# Patient Record
Sex: Male | Born: 1950 | Race: White | Hispanic: No | Marital: Married | Smoking: Former smoker
Health system: Southern US, Community
[De-identification: ages and names within clinical notes are randomized; demographics above are authoritative.]

## PROBLEM LIST (undated history)

## (undated) DIAGNOSIS — C801 Malignant (primary) neoplasm, unspecified: Secondary | ICD-10-CM

## (undated) DIAGNOSIS — I10 Essential (primary) hypertension: Secondary | ICD-10-CM

## (undated) DIAGNOSIS — J449 Chronic obstructive pulmonary disease, unspecified: Secondary | ICD-10-CM

## (undated) HISTORY — PX: BLADDER REMOVAL: SHX567

## (undated) HISTORY — PX: LOBECTOMY: SHX5089

## (undated) HISTORY — PX: OTHER SURGICAL HISTORY: SHX169

---

## 2009-05-22 ENCOUNTER — Ambulatory Visit: Payer: Self-pay | Admitting: Orthopedic Surgery

## 2009-07-20 ENCOUNTER — Inpatient Hospital Stay: Payer: Self-pay | Admitting: Internal Medicine

## 2009-07-20 IMAGING — CR DG ABDOMEN 3V
1 series · 4 of 4 positions shown · non-contrast
Comparison: none

REASON FOR EXAM: no bm in "about a week"
COMMENTS:

PROCEDURE:     DXR - DXR ABDOMEN 3-WAY (INCL PA CXR)  - [DATE]  [DATE]
RESULT:     Comparisons:  None

[Series 1: view not recorded · 0.17mm/px · 4 of 4 slices shown]
[im 1/4]
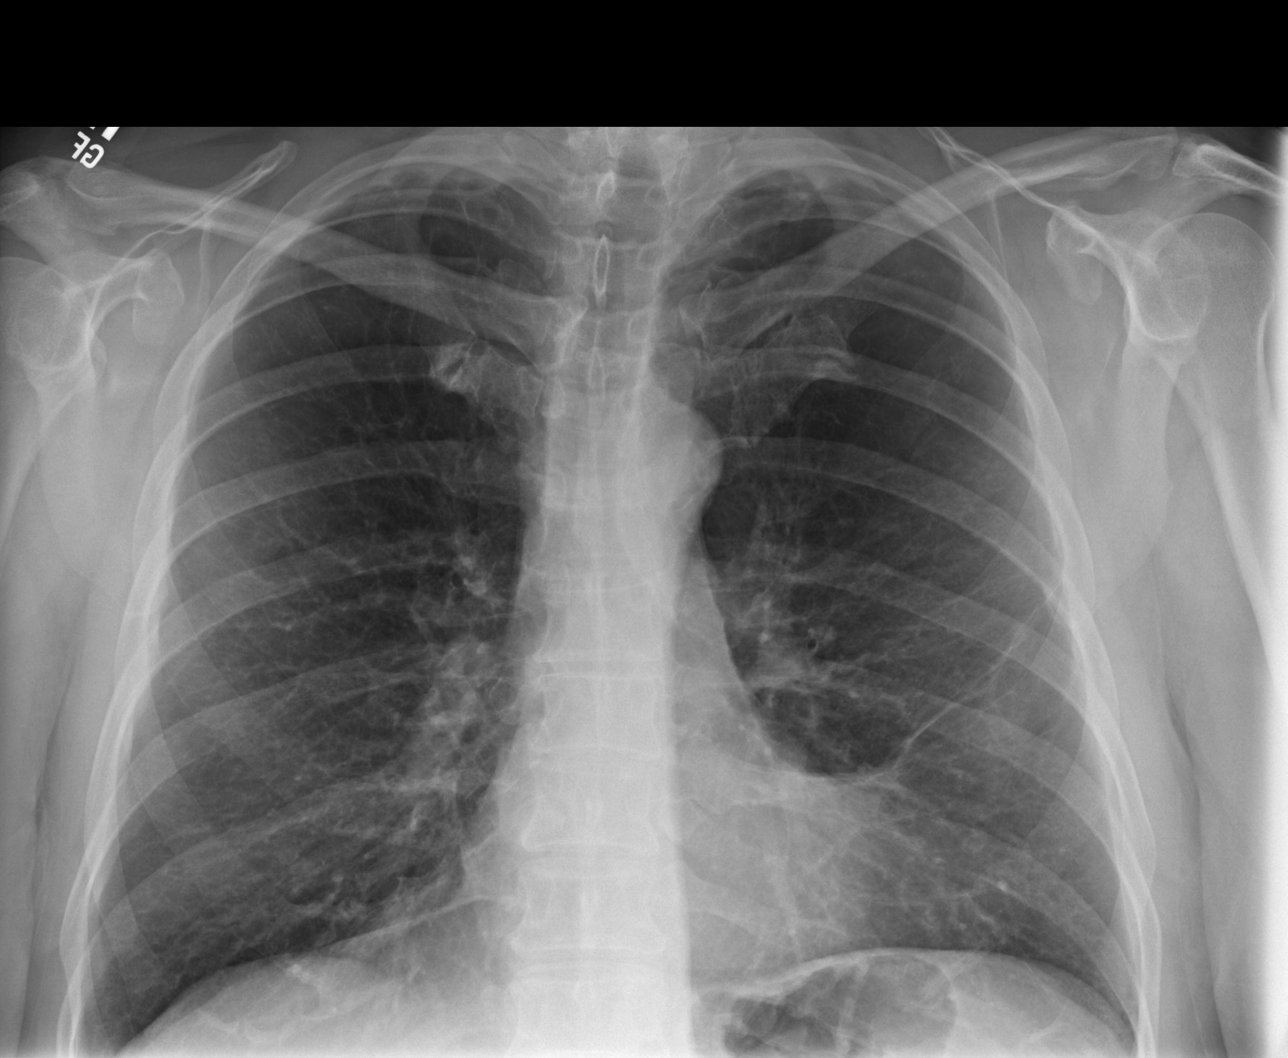
[im 2/4]
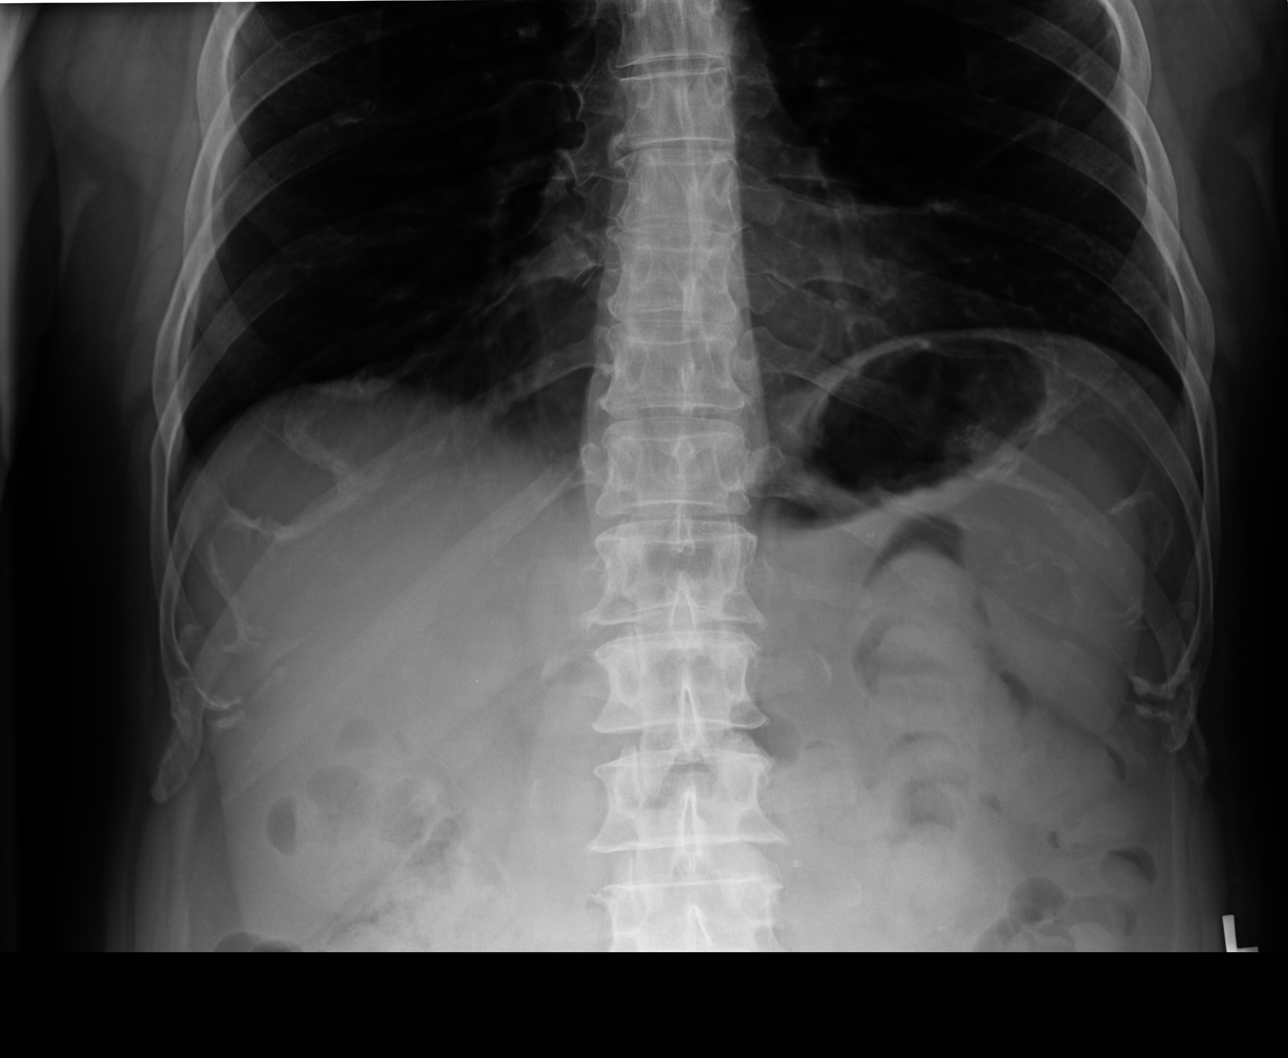
[im 3/4]
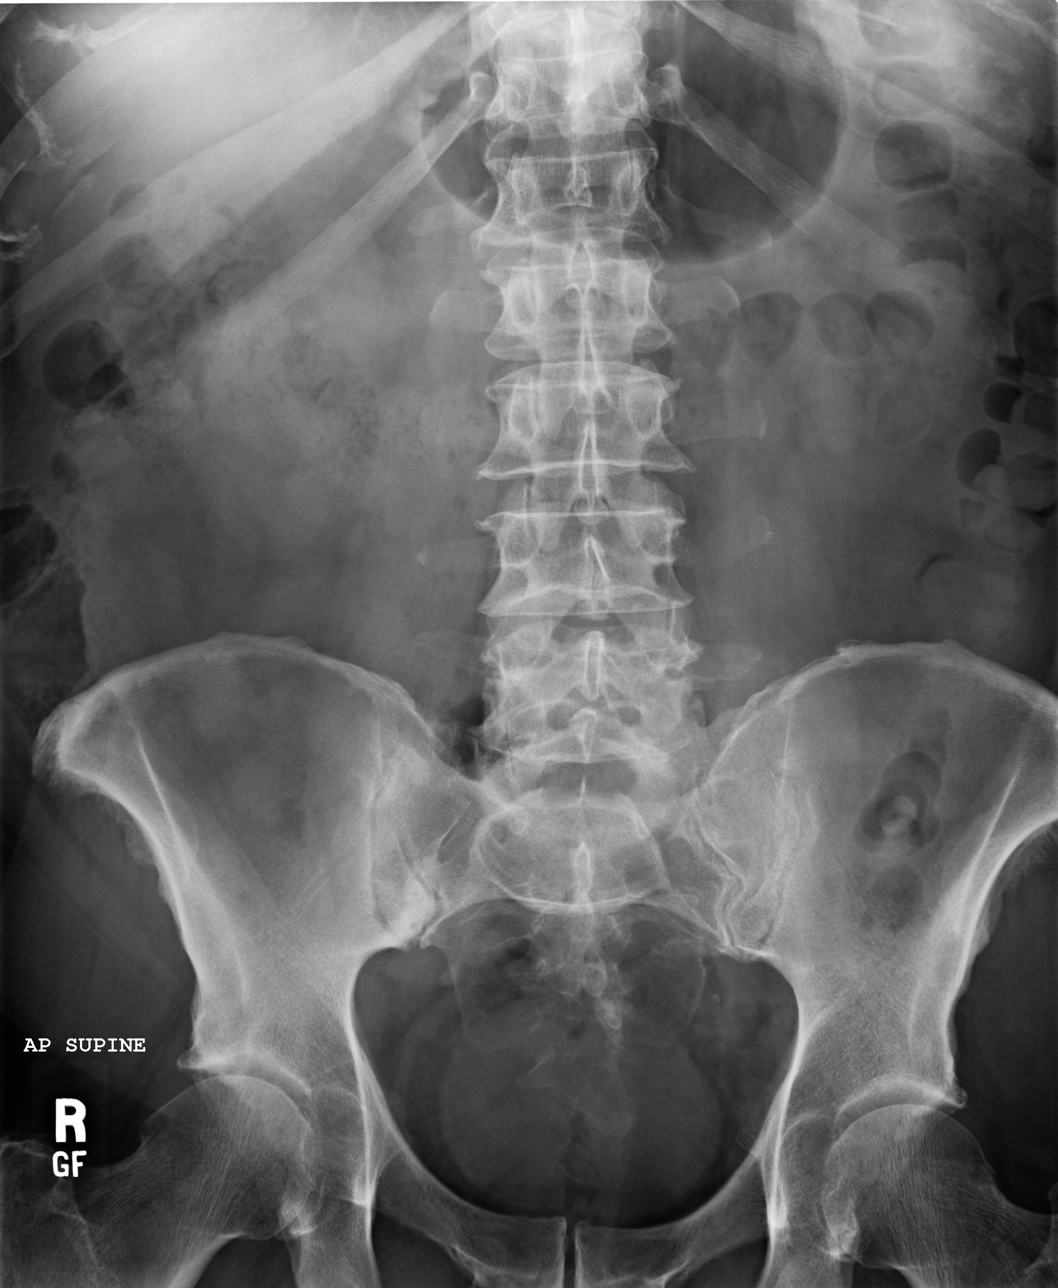
[im 4/4]
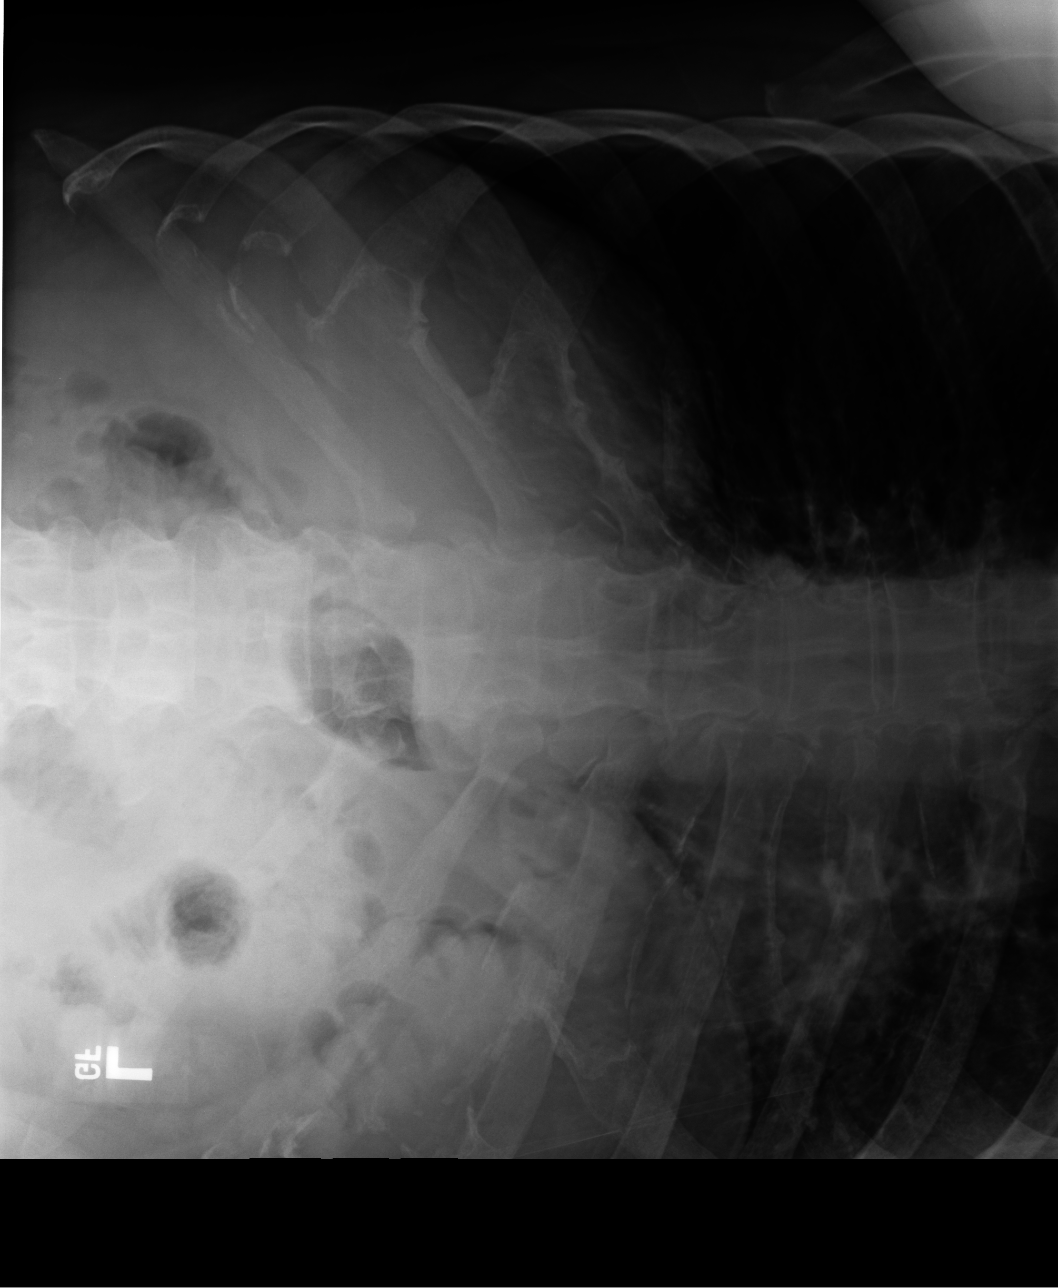

[4 of 4 positions shown; findings below may reference images not displayed]

FINDINGS: PA chest and supine, upright, and left lateral decubitus views of the
abdomen are provided.

There is lingular airspace disease likely representing atelectasis. The
heart and mediastinum are unremarkable. The osseous structures are
unremarkable.

There is a nonspecific bowel gas pattern. There is no bowel dilatation to
suggest obstruction. There are no air-fluid levels. There are no dilated
loops, bowel wall thickening, or evidence of mass effect.  Soft tissue
shadows are normal. There is no evidence of pneumoperitoneum, portal venous
gas, or pneumatosis.

Osseous structures are unremarkable.
IMPRESSION: Unremarkable abdominal series.

## 2009-07-21 IMAGING — US US RENAL KIDNEY
1 series · 17 of 25 positions shown · non-contrast
Comparison: none

REASON FOR EXAM: ARF
COMMENTS:

PROCEDURE:     US  - US KIDNEY  - [DATE]  [DATE]
RESULT:     Comparison: None
INDICATION: Acute renal failure

[Series 1: us renal kidney · 17 of 35 slices shown]
[im 1/35]
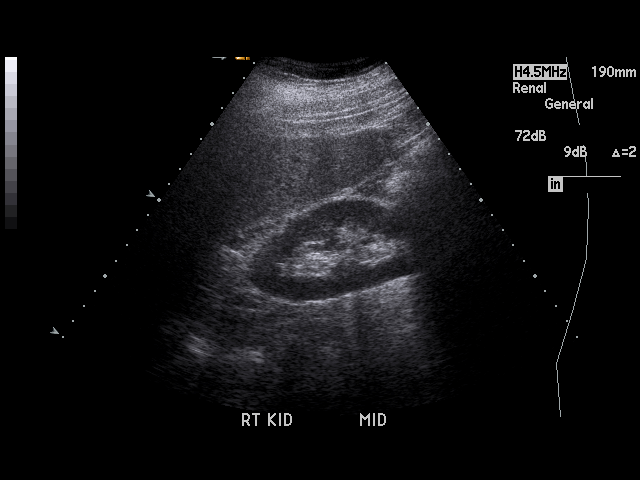
[im 3/35]
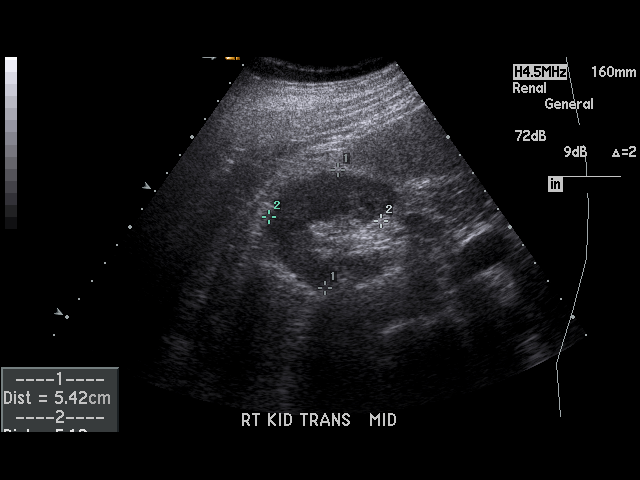
[im 5/35]
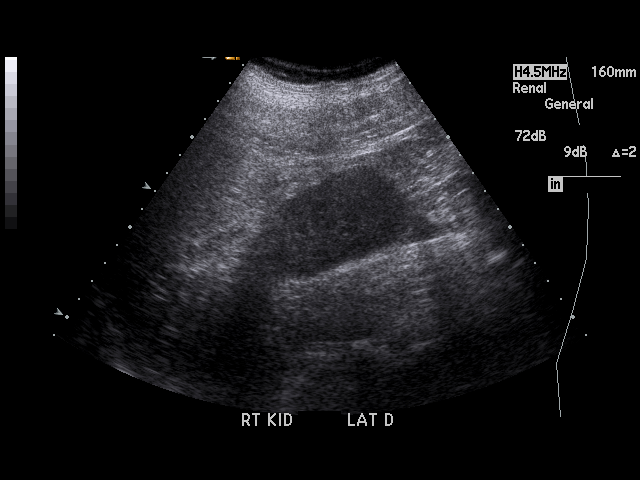
[im 8/35]
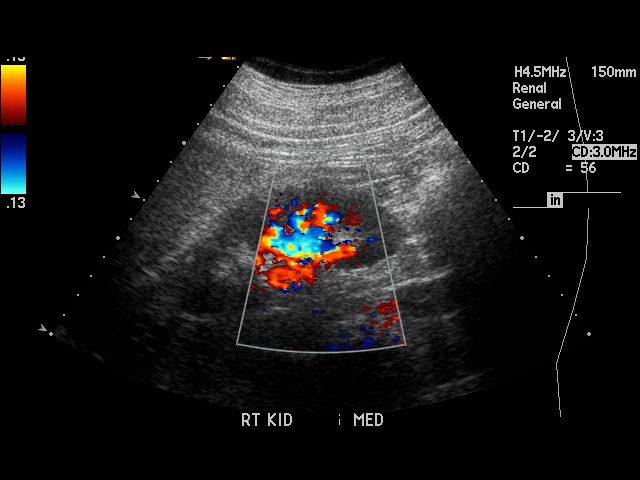
[im 9/35]
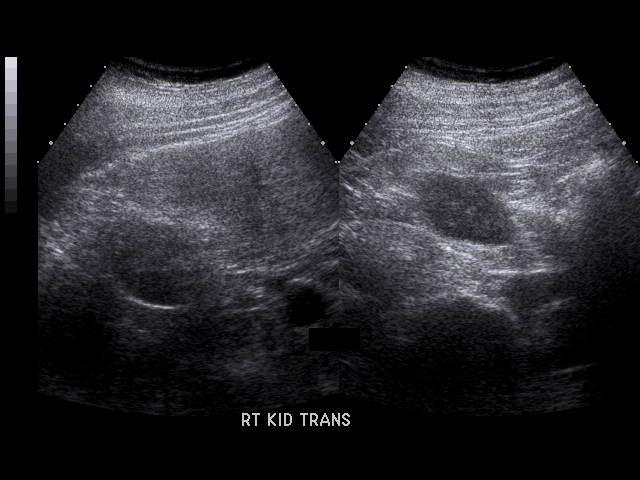
[im 12/35]
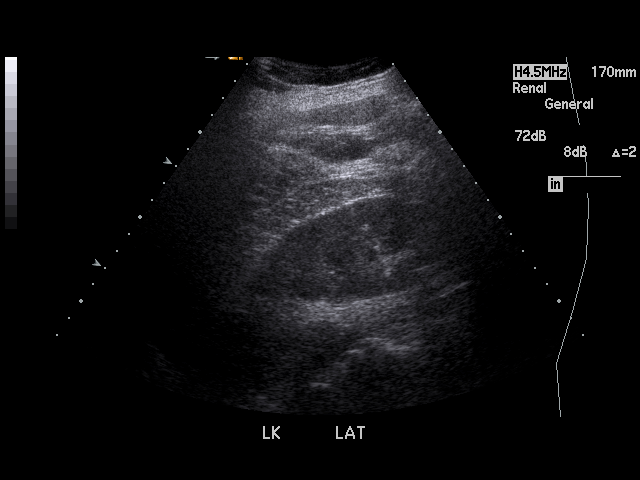
[im 13/35]
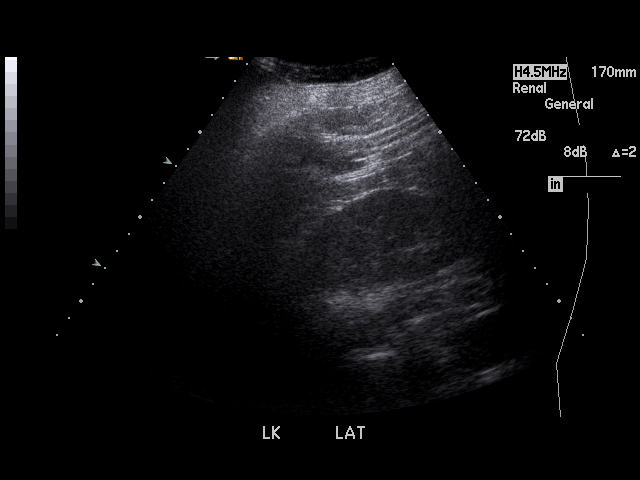
[im 16/35]
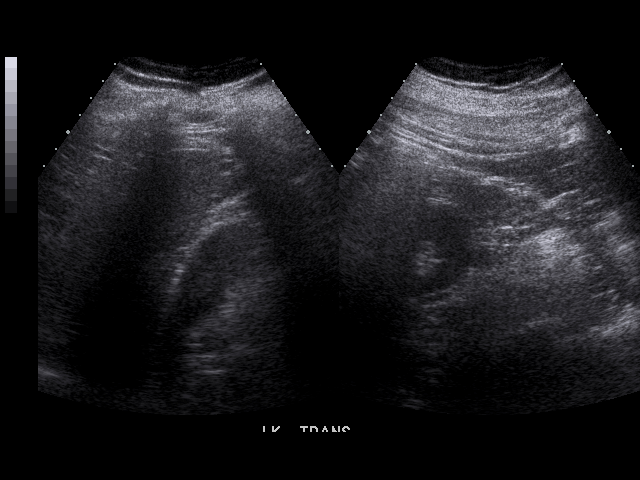
[im 18/35]
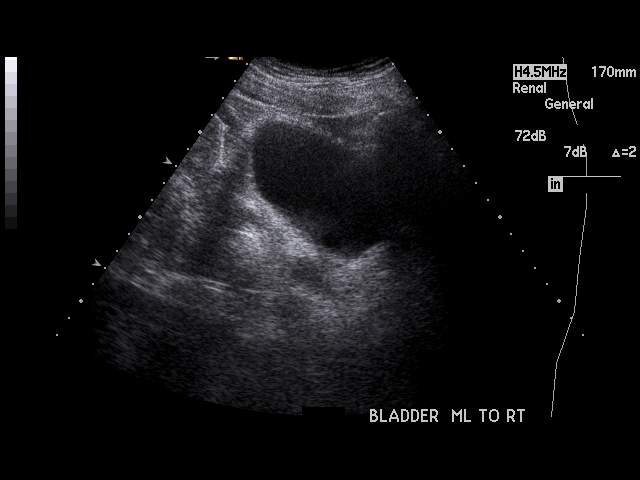
[im 19/35]
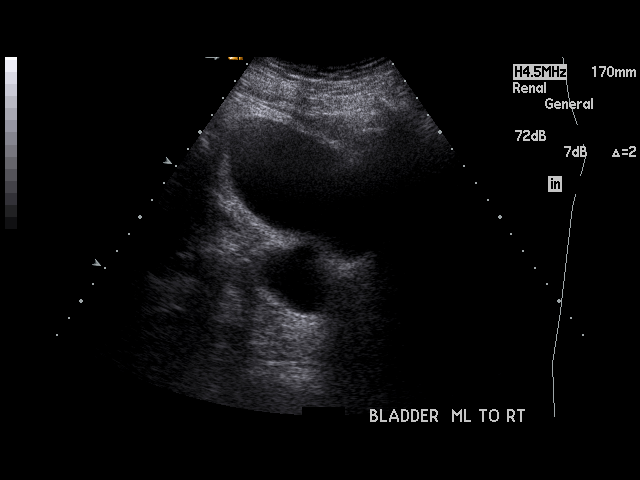
[im 22/35]
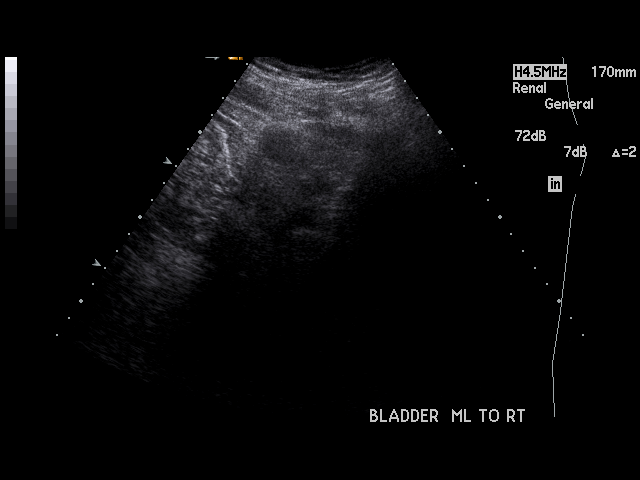
[im 23/35]
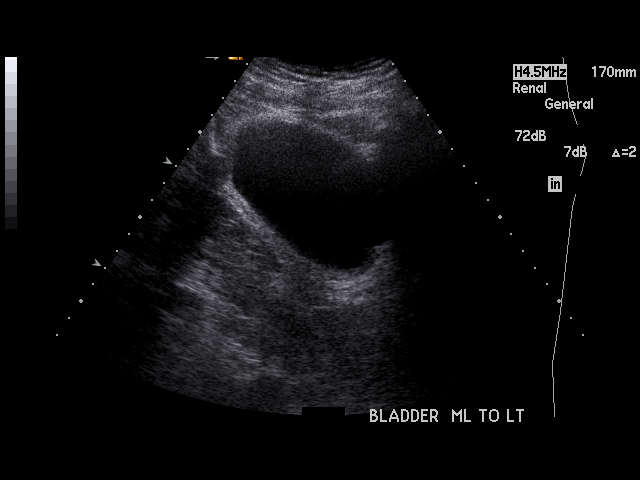
[im 26/35]
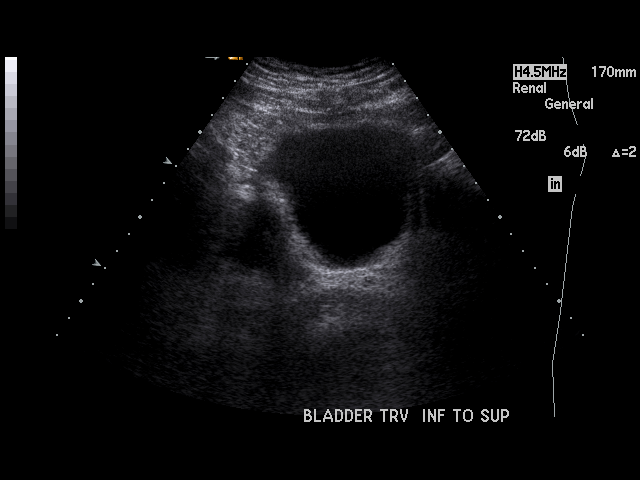
[im 27/35]
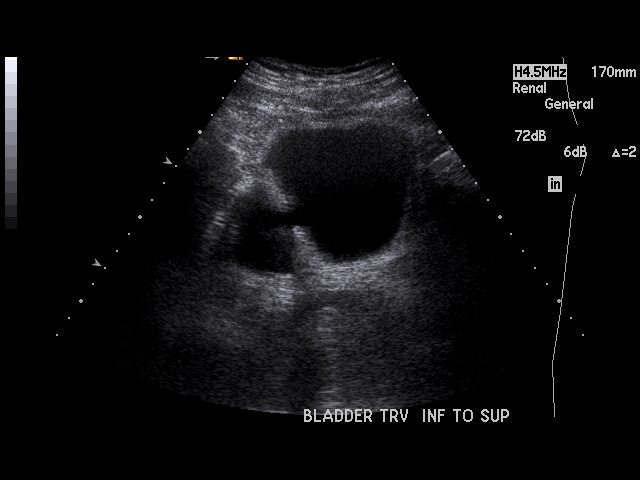
[im 30/35]
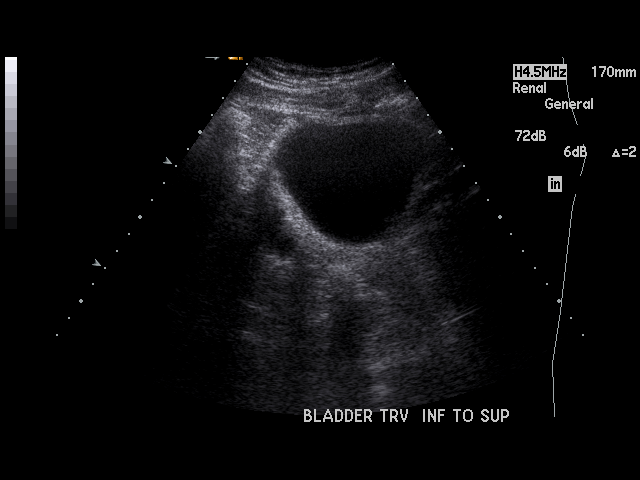
[im 32/35]
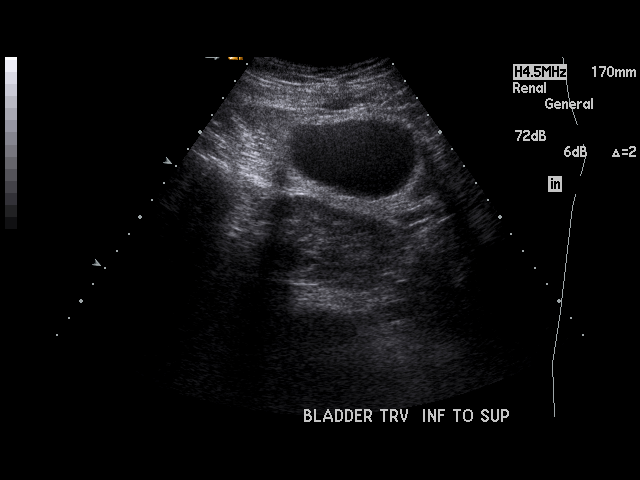
[im 35/35]
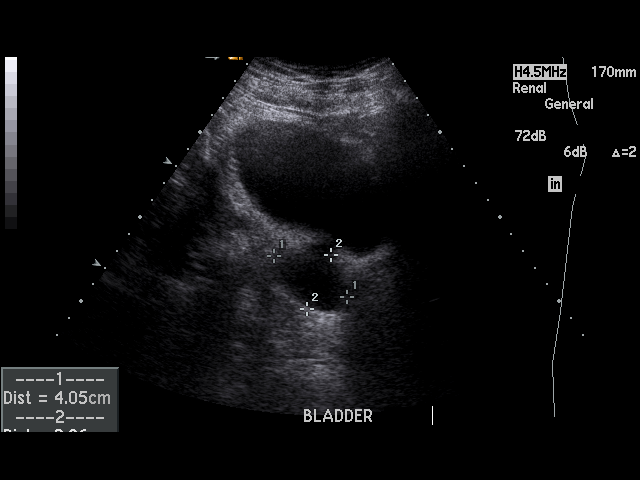

[17 of 25 positions shown; findings below may reference images not displayed]

Technique and findings: Multiple gray-scale and color doppler images of the
kidneys were obtained.

The right kidney measures 11.1 cm, the left kidney measures 11.3 cm. The
kidneys are normal in echogenicity. There is no hydronephrosis, echogenic
foci, or evident renal masses. No free fluid in the region of the renal
fossa. There is a 4.4 x 2.9 x 2.8 cm right bladder diverticulum.
IMPRESSION: 1. Normal renal ultrasound.

2. Right bladder diverticulum.

## 2009-08-14 ENCOUNTER — Ambulatory Visit: Payer: Self-pay | Admitting: Gastroenterology

## 2009-09-23 ENCOUNTER — Ambulatory Visit: Payer: Self-pay | Admitting: Gastroenterology

## 2015-02-14 ENCOUNTER — Emergency Department: Payer: Self-pay | Admitting: Emergency Medicine

## 2015-02-14 IMAGING — CR DG CHEST 2V
1 series · 2 of 2 positions shown · non-contrast
Comparison: None.

CLINICAL DATA: Initial evaluation for acute shortness of breath.

EXAM:
CHEST  2 VIEW

[Series 1: dxr chest pa (or ap) and lateral · 0.14mm/px · 2 of 2 slices shown]
[im 1/2]
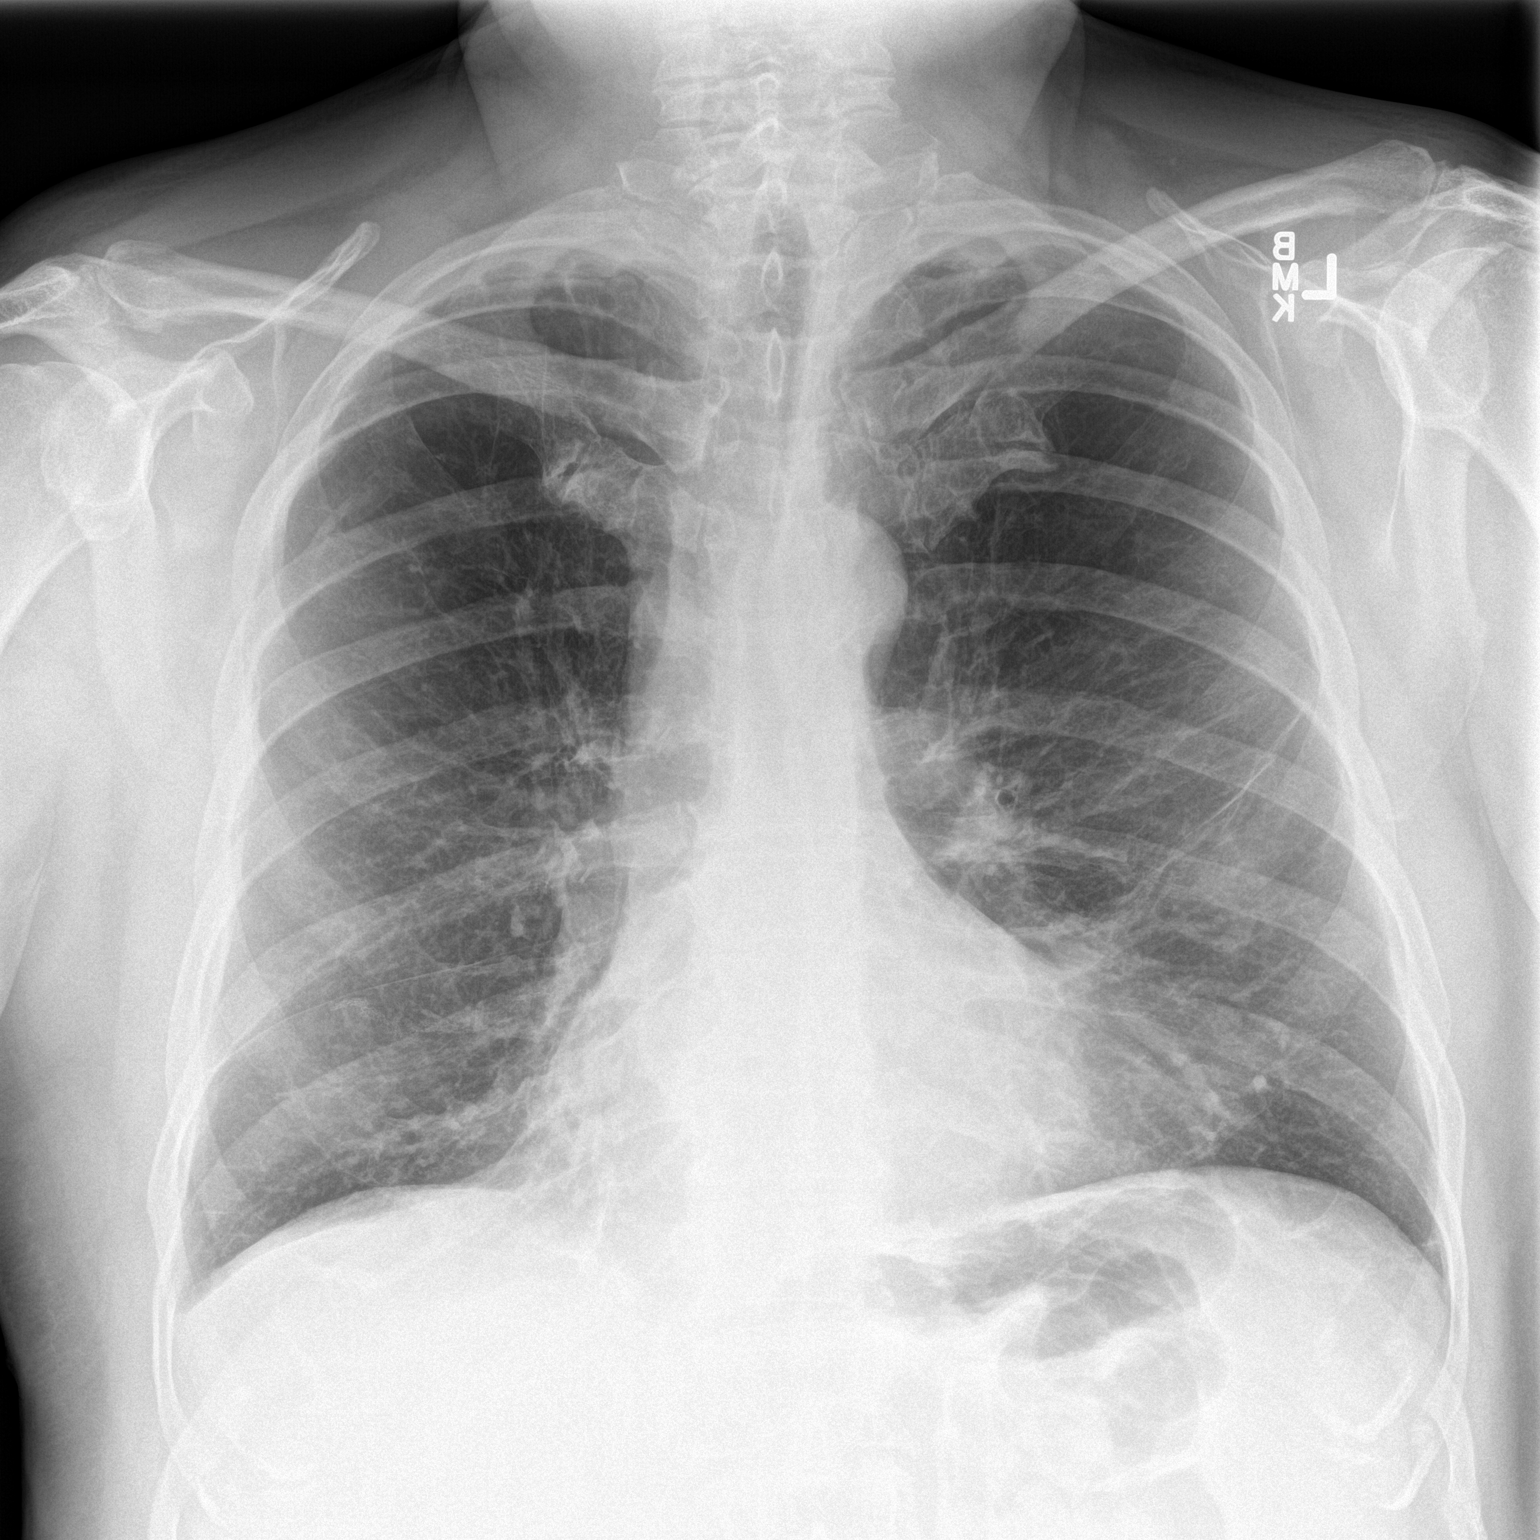
[im 2/2]
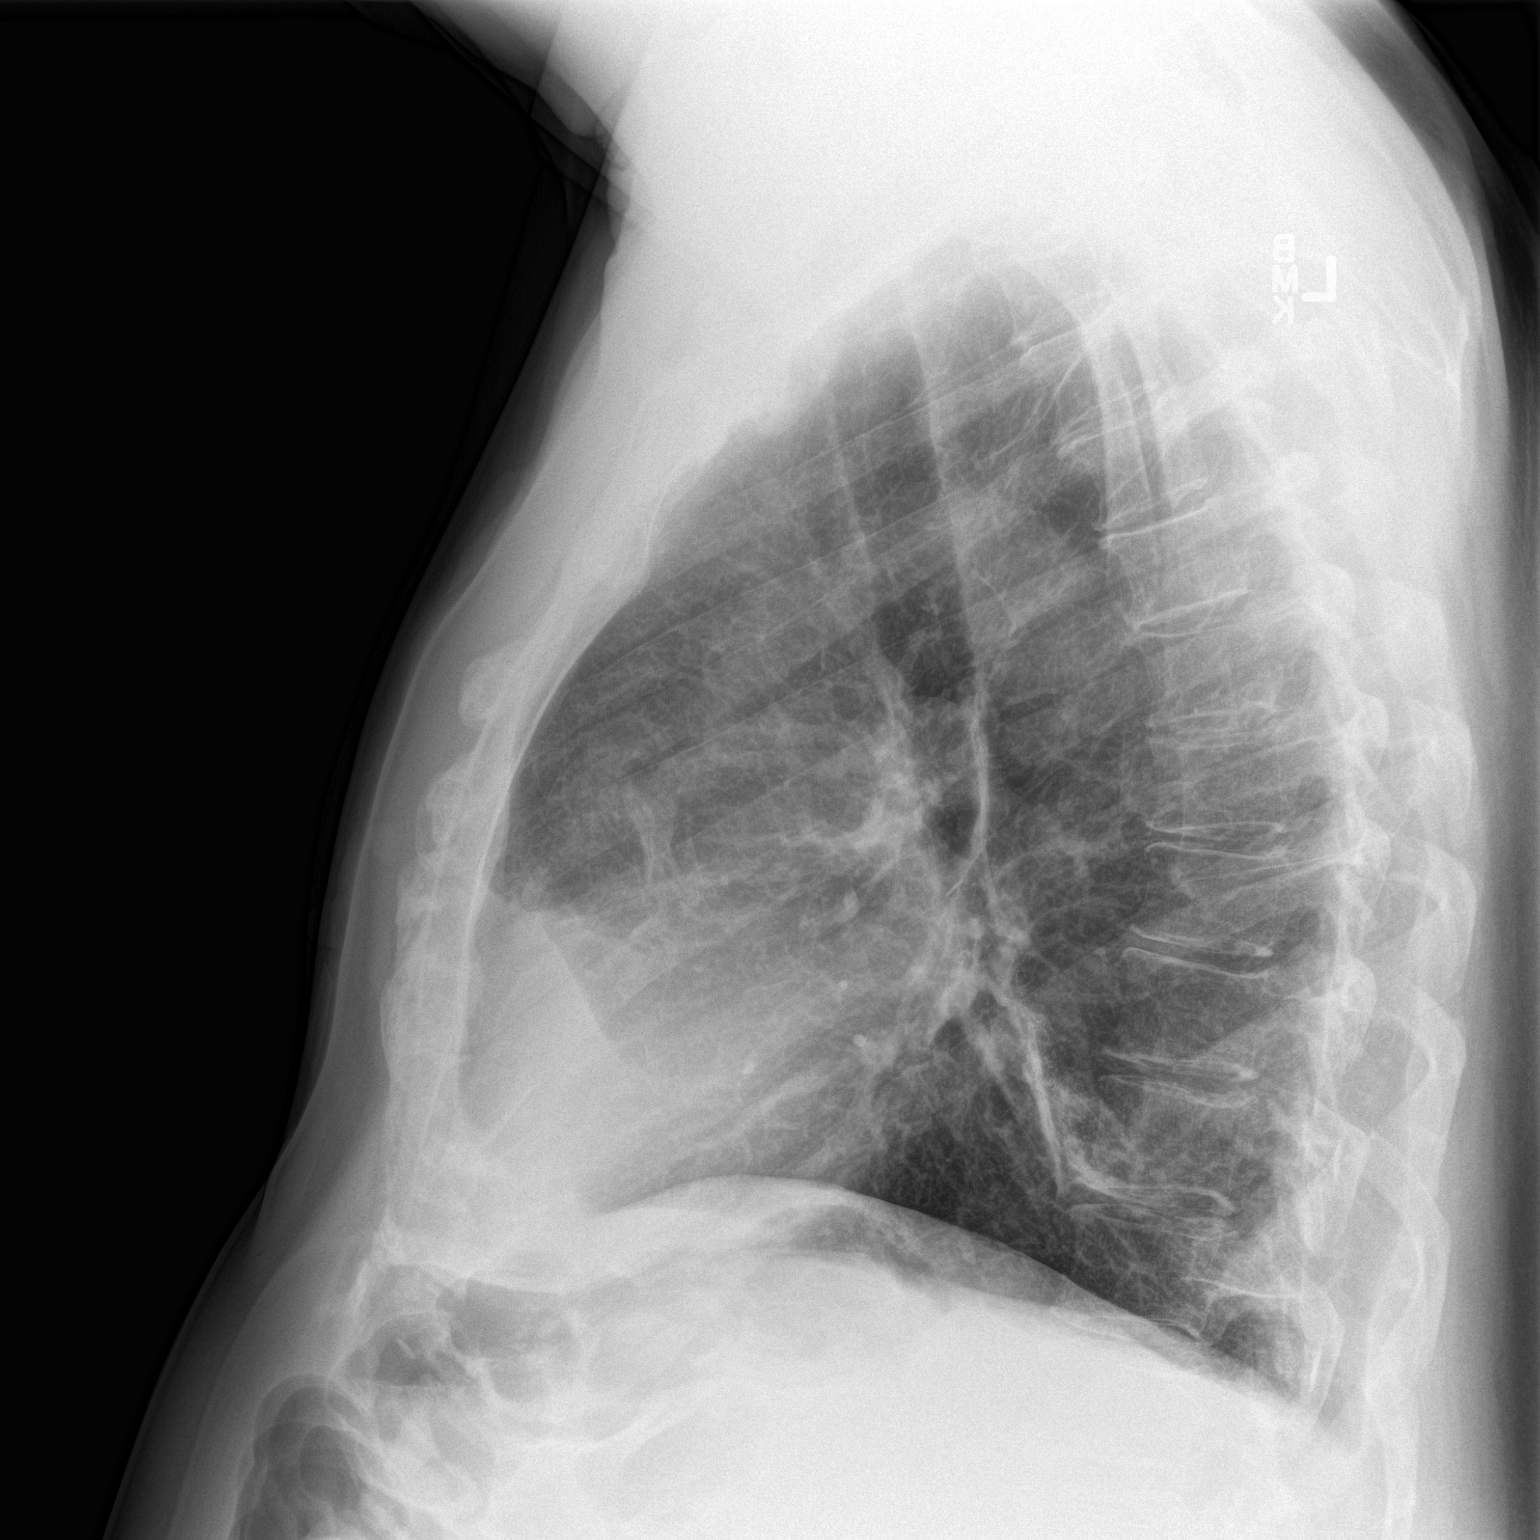

[2 of 2 positions shown; findings below may reference images not displayed]

FINDINGS: Cardiac and mediastinal silhouettes are within normal limits.

Lungs are normally inflated. Chronic emphysematous changes present.
There is streaky left perihilar scarring. No focal infiltrates. No
pulmonary edema or pleural effusion. No pneumothorax. Irregular
biapical pleural thickening noted.

Mild multilevel degenerative changes noted within the visualized
spine. No acute osseus abnormality.
IMPRESSION: COPD.  No active cardiopulmonary disease.

## 2017-07-20 ENCOUNTER — Emergency Department
Admission: EM | Admit: 2017-07-20 | Discharge: 2017-07-20 | Disposition: A | Discharge status: Home / Self Care | Payer: Non-veteran care | Attending: Emergency Medicine | Admitting: Emergency Medicine

## 2017-07-20 ENCOUNTER — Encounter: Payer: Self-pay | Admitting: Emergency Medicine

## 2017-07-20 DIAGNOSIS — T83031A Leakage of indwelling urethral catheter, initial encounter: Secondary | ICD-10-CM | POA: Diagnosis not present

## 2017-07-20 DIAGNOSIS — Y732 Prosthetic and other implants, materials and accessory gastroenterology and urology devices associated with adverse incidents: Secondary | ICD-10-CM | POA: Diagnosis not present

## 2017-07-20 DIAGNOSIS — J449 Chronic obstructive pulmonary disease, unspecified: Secondary | ICD-10-CM | POA: Diagnosis not present

## 2017-07-20 DIAGNOSIS — R6883 Chills (without fever): Secondary | ICD-10-CM | POA: Diagnosis not present

## 2017-07-20 DIAGNOSIS — I10 Essential (primary) hypertension: Secondary | ICD-10-CM | POA: Diagnosis not present

## 2017-07-20 DIAGNOSIS — T839XXA Unspecified complication of genitourinary prosthetic device, implant and graft, initial encounter: Secondary | ICD-10-CM

## 2017-07-20 HISTORY — DX: Essential (primary) hypertension: I10

## 2017-07-20 HISTORY — DX: Chronic obstructive pulmonary disease, unspecified: J44.9

## 2017-07-20 HISTORY — DX: Malignant (primary) neoplasm, unspecified: C80.1

## 2017-07-20 LAB — URINALYSIS, ROUTINE W REFLEX MICROSCOPIC
BACTERIA UA: NONE SEEN
BILIRUBIN URINE: NEGATIVE
Glucose, UA: NEGATIVE mg/dL
Ketones, ur: NEGATIVE mg/dL
NITRITE: NEGATIVE
PROTEIN: 100 mg/dL — AB
SQUAMOUS EPITHELIAL / LPF: NONE SEEN
Specific Gravity, Urine: 1.011 (ref 1.005–1.030)
pH: 6 (ref 5.0–8.0)

## 2017-07-20 LAB — CBC WITH DIFFERENTIAL/PLATELET
BASOS ABS: 0 10*3/uL (ref 0–0.1)
BASOS PCT: 0 %
Eosinophils Absolute: 0 10*3/uL (ref 0–0.7)
Eosinophils Relative: 0 %
HEMATOCRIT: 31.9 % — AB (ref 40.0–52.0)
Hemoglobin: 10.6 g/dL — ABNORMAL LOW (ref 13.0–18.0)
LYMPHS PCT: 6 %
Lymphs Abs: 0.4 10*3/uL — ABNORMAL LOW (ref 1.0–3.6)
MCH: 32.3 pg (ref 26.0–34.0)
MCHC: 33.4 g/dL (ref 32.0–36.0)
MCV: 96.9 fL (ref 80.0–100.0)
MONO ABS: 0.3 10*3/uL (ref 0.2–1.0)
MONOS PCT: 5 %
NEUTROS PCT: 89 %
Neutro Abs: 6.1 10*3/uL (ref 1.4–6.5)
PLATELETS: 221 10*3/uL (ref 150–440)
RBC: 3.29 MIL/uL — AB (ref 4.40–5.90)
RDW: 15.7 % — AB (ref 11.5–14.5)
WBC: 6.9 10*3/uL (ref 3.8–10.6)

## 2017-07-20 LAB — COMPREHENSIVE METABOLIC PANEL
ALBUMIN: 3.5 g/dL (ref 3.5–5.0)
ALK PHOS: 78 U/L (ref 38–126)
ALT: 10 U/L — ABNORMAL LOW (ref 17–63)
AST: 19 U/L (ref 15–41)
Anion gap: 10 (ref 5–15)
BUN: 19 mg/dL (ref 6–20)
CHLORIDE: 104 mmol/L (ref 101–111)
CO2: 23 mmol/L (ref 22–32)
Calcium: 8.8 mg/dL — ABNORMAL LOW (ref 8.9–10.3)
Creatinine, Ser: 1.74 mg/dL — ABNORMAL HIGH (ref 0.61–1.24)
GFR calc non Af Amer: 39 mL/min — ABNORMAL LOW (ref >60)
GFR, EST AFRICAN AMERICAN: 45 mL/min — AB (ref >60)
GLUCOSE: 154 mg/dL — AB (ref 65–99)
POTASSIUM: 4.2 mmol/L (ref 3.5–5.1)
SODIUM: 137 mmol/L (ref 135–145)
Total Bilirubin: 1.2 mg/dL (ref 0.3–1.2)
Total Protein: 7.6 g/dL (ref 6.5–8.1)

## 2017-07-20 LAB — LACTIC ACID, PLASMA: LACTIC ACID, VENOUS: 1.6 mmol/L (ref 0.5–1.9)

## 2017-07-20 NOTE — ED Triage Notes (Signed)
Pt arrived to ED via EMS from home with reports that pt had a bladder biopsy on Monday, Foley Catheter placed, pt called EMS tonight due to urinating around catheter. Catheter in place on assessment and draining.

## 2017-07-20 NOTE — Discharge Instructions (Signed)
Please keep your follow-up appointment with your urologist in 2 days as scheduled and return to the emergency department for any new or worsening symptoms such as fevers, chills, pain, if your Foley catheter becomes block, or for any other concerns whatsoever.  It was a pleasure to take care of you today, and thank you for coming to our emergency department.  If you have any questions or concerns before leaving please ask the nurse to grab me and I'm more than happy to go through your aftercare instructions again.  If you were prescribed any opioid pain medication today such as Norco, Vicodin, Percocet, morphine, hydrocodone, or oxycodone please make sure you do not drive when you are taking this medication as it can alter your ability to drive safely.  If you have any concerns once you are home that you are not improving or are in fact getting worse before you can make it to your follow-up appointment, please do not hesitate to call [phone] and come back for further evaluation.  Darel Hong, MD  Results for orders placed or performed during the hospital encounter of 07/20/17  Lactic acid, plasma  Result Value Ref Range   Lactic Acid, Venous 1.6 0.5 - 1.9 mmol/L  Comprehensive metabolic panel  Result Value Ref Range   Sodium 137 135 - 145 mmol/L   Potassium 4.2 3.5 - 5.1 mmol/L   Chloride 104 101 - 111 mmol/L   CO2 23 22 - 32 mmol/L   Glucose, Bld 154 (H) 65 - 99 mg/dL   BUN 19 6 - 20 mg/dL   Creatinine, Ser 1.74 (H) 0.61 - 1.24 mg/dL   Calcium 8.8 (L) 8.9 - 10.3 mg/dL   Total Protein 7.6 6.5 - 8.1 g/dL   Albumin 3.5 3.5 - 5.0 g/dL   AST 19 15 - 41 U/L   ALT 10 (L) 17 - 63 U/L   Alkaline Phosphatase 78 38 - 126 U/L   Total Bilirubin 1.2 0.3 - 1.2 mg/dL   GFR calc non Af Amer 39 (L) >60 mL/min   GFR calc Af Amer 45 (L) >60 mL/min   Anion gap 10 5 - 15  CBC WITH DIFFERENTIAL  Result Value Ref Range   WBC 6.9 3.8 - 10.6 K/uL   RBC 3.29 (L) 4.40 - 5.90 MIL/uL   Hemoglobin 10.6 (L)  13.0 - 18.0 g/dL   HCT 31.9 (L) 40.0 - 52.0 %   MCV 96.9 80.0 - 100.0 fL   MCH 32.3 26.0 - 34.0 pg   MCHC 33.4 32.0 - 36.0 g/dL   RDW 15.7 (H) 11.5 - 14.5 %   Platelets 221 150 - 440 K/uL   Neutrophils Relative % 89 %   Neutro Abs 6.1 1.4 - 6.5 K/uL   Lymphocytes Relative 6 %   Lymphs Abs 0.4 (L) 1.0 - 3.6 K/uL   Monocytes Relative 5 %   Monocytes Absolute 0.3 0.2 - 1.0 K/uL   Eosinophils Relative 0 %   Eosinophils Absolute 0.0 0 - 0.7 K/uL   Basophils Relative 0 %   Basophils Absolute 0.0 0 - 0.1 K/uL  Urinalysis, Routine w reflex microscopic  Result Value Ref Range   Color, Urine RED (A) YELLOW   APPearance HAZY (A) CLEAR   Specific Gravity, Urine 1.011 1.005 - 1.030   pH 6.0 5.0 - 8.0   Glucose, UA NEGATIVE NEGATIVE mg/dL   Hgb urine dipstick LARGE (A) NEGATIVE   Bilirubin Urine NEGATIVE NEGATIVE   Ketones, ur NEGATIVE NEGATIVE  mg/dL   Protein, ur 100 (A) NEGATIVE mg/dL   Nitrite NEGATIVE NEGATIVE   Leukocytes, UA MODERATE (A) NEGATIVE   RBC / HPF TOO NUMEROUS TO COUNT 0 - 5 RBC/hpf   WBC, UA TOO NUMEROUS TO COUNT 0 - 5 WBC/hpf   Bacteria, UA NONE SEEN NONE SEEN   Squamous Epithelial / LPF NONE SEEN NONE SEEN   Mucous PRESENT

## 2017-07-20 NOTE — ED Provider Notes (Signed)
[facility] Emergency Department Provider Note  ____________________________________________   First MD Initiated Contact with Patient 07/20/17 [phone]     (approximate)  I have reviewed the triage vital signs and the nursing notes.   HISTORY  Chief Complaint Post-op Problem    HPI Samuel Moore is a 66 y.o. male is brought to the emergency department via EMS after noting that his Foley catheter was leaking. He was urinating from his penis around the Foley catheter. He also had a brief episode of chills. Earlier today he was at the [facility] and had surgery for bladder cancer. He is currently in no pain. His surgeon told him that if he developed any complications to go immediately to the nearest emergency department. The chills were a brief episode and has not recurred.   Past Medical History:  Diagnosis Date  . Cancer ([place])   . COPD (chronic obstructive pulmonary disease) ([place])   . Hypertension     There are no active problems to display for this patient.   History reviewed. No pertinent surgical history.  Prior to Admission medications   Not on File    Allergies Patient has no known allergies.  History reviewed. No pertinent family history.  Social History Social History  Substance Use Topics  . Smoking status: Never Smoker  . Smokeless tobacco: Never Used  . Alcohol use No    Review of Systems Constitutional: Positive chills Eyes: No visual changes. ENT: No sore throat. Cardiovascular: Denies chest pain. Respiratory: Denies shortness of breath. Gastrointestinal: No abdominal pain.  No nausea, no vomiting.  No diarrhea.  No constipation. Genitourinary: Negative for dysuria. Musculoskeletal: Negative for back pain. Skin: Negative for rash. Neurological: Negative for headaches, focal weakness or numbness.   ____________________________________________   PHYSICAL EXAM:  VITAL SIGNS: ED Triage Vitals [07/20/17 0051]  Enc  Vitals Group     BP (!) 150/100     Pulse Rate 95     Resp 16     Temp 98 F (36.7 C)     Temp Source Oral     SpO2 98 %     Weight 222 lb (100.7 kg)     Height 6' (1.829 m)     Head Circumference      Peak Flow      Pain Score      Pain Loc      Pain Edu?      Excl. in [place]?     Constitutional: Alert and oriented 4 pleasant cooperative speaks in full clear sentences no diaphoresis Eyes: PERRL EOMI. Head: Atraumatic. Nose: No congestion/rhinnorhea. Mouth/Throat: No trismus Neck: No stridor.   Cardiovascular: Normal rate, regular rhythm. Grossly normal heart sounds.  Good peripheral circulation. Respiratory: Normal respiratory effort.  No retractions. Lungs CTAB and moving good air Gastrointestinal: Soft nondistended nontender no rebound or guarding no peritonitis fully catheter in place with blood-tinged urine no costovertebral tenderness Musculoskeletal: No lower extremity edema   Neurologic:  Normal speech and language. No gross focal neurologic deficits are appreciated. Skin:  Skin is warm, dry and intact. No rash noted. Psychiatric: Mood and affect are normal. Speech and behavior are normal.    ____________________________________________   DIFFERENTIAL includes but not limited to  Sepsis, UTI, pyelonephritis, bacteremia ____________________________________________   LABS (all labs ordered are listed, but only abnormal results are displayed)  Labs Reviewed  COMPREHENSIVE METABOLIC PANEL - Abnormal; Notable for the following:       Result Value  Glucose, Bld 154 (*)    Creatinine, Ser 1.74 (*)    Calcium 8.8 (*)    ALT 10 (*)    GFR calc non Af Amer 39 (*)    GFR calc Af Amer 45 (*)    All other components within normal limits  CBC WITH DIFFERENTIAL/PLATELET - Abnormal; Notable for the following:    RBC 3.29 (*)    Hemoglobin 10.6 (*)    HCT 31.9 (*)    RDW 15.7 (*)    Lymphs Abs 0.4 (*)    All other components within normal limits  URINALYSIS, ROUTINE  W REFLEX MICROSCOPIC - Abnormal; Notable for the following:    Color, Urine RED (*)    APPearance HAZY (*)    Hgb urine dipstick LARGE (*)    Protein, ur 100 (*)    Leukocytes, UA MODERATE (*)    All other components within normal limits  CULTURE, BLOOD (ROUTINE X 2)  CULTURE, BLOOD (ROUTINE X 2)  URINE CULTURE  LACTIC ACID, PLASMA    Normal white count, no nitrites, normal lactate, no signs of infection __________________________________________  EKG   ____________________________________________  RADIOLOGY   ____________________________________________   PROCEDURES  Procedure(s) performed: no  Procedures  Critical Care performed: no  Observation: no ____________________________________________   INITIAL IMPRESSION / ASSESSMENT AND PLAN / ED COURSE  Pertinent labs & imaging results that were available during my care of the patient were reviewed by me and considered in my medical decision making (see chart for details).  The patient arrives very well-appearing with a benign abdominal exam and no costovertebral tenderness. Regarding the leaking around his Foley catheter I explained to the patient that this is quite normal. He is afebrile here. His brief episode of chills however is concerning for an infectious etiology. Fortunately his blood work is reassuring. Cultures have been sent and we will follow-up. At this point is medically stable for outpatient management with follow-up in 2 days as scheduled. Strict return precautions given.      ____________________________________________   FINAL CLINICAL IMPRESSION(S) / ED DIAGNOSES  Final diagnoses:  Chills  Problem with Foley catheter, initial encounter ([place])      NEW MEDICATIONS STARTED DURING THIS VISIT:  There are no discharge medications for this patient.    Note:  This document was prepared using Dragon voice recognition software and may include unintentional dictation errors.     Darel Hong, MD 07/20/17 [phone]

## 2017-07-21 LAB — URINE CULTURE: CULTURE: NO GROWTH

## 2017-07-25 LAB — CULTURE, BLOOD (ROUTINE X 2)
CULTURE: NO GROWTH
Culture: NO GROWTH
SPECIAL REQUESTS: ADEQUATE
SPECIAL REQUESTS: ADEQUATE

## 2017-12-21 ENCOUNTER — Other Ambulatory Visit: Payer: Self-pay

## 2017-12-21 ENCOUNTER — Encounter: Payer: Non-veteran care | Attending: Internal Medicine

## 2017-12-21 VITALS — Ht 73.2 in | Wt 180.8 lb

## 2017-12-21 DIAGNOSIS — J449 Chronic obstructive pulmonary disease, unspecified: Secondary | ICD-10-CM | POA: Insufficient documentation

## 2017-12-21 DIAGNOSIS — Z8701 Personal history of pneumonia (recurrent): Secondary | ICD-10-CM | POA: Diagnosis not present

## 2017-12-21 DIAGNOSIS — C679 Malignant neoplasm of bladder, unspecified: Secondary | ICD-10-CM | POA: Diagnosis not present

## 2017-12-21 NOTE — Progress Notes (Signed)
Pulmonary Individual Treatment Plan  Patient Details  Name: Samuel Moore MRN: [id number] Date of Birth: [birth year] Referring Provider:     Pulmonary Rehab from 12/21/2017 in [facility] Cardiac and Pulmonary Rehab  Referring Provider  Califano, Lucy Chris MD [[facility]]      Initial Encounter Date:    Pulmonary Rehab from 12/21/2017 in [facility] Cardiac and Pulmonary Rehab  Date  12/21/17  Referring Provider  Elenore Rota MD [[place]]      Visit Diagnosis: Chronic obstructive pulmonary disease, unspecified COPD type ([place])  Patient's Home Medications on Admission: No current outpatient medications on file.  Past Medical History: Past Medical History:  Diagnosis Date  . Cancer ([place])   . COPD (chronic obstructive pulmonary disease) ([place])   . Hypertension     Tobacco Use: Social History   Tobacco Use  Smoking Status Former Smoker  . Packs/day: 2.00  . Years: 10.00  . Pack years: 20.00  . Types: Cigarettes  . Last attempt to quit: 12/14/2002  . Years since quitting: 15.0  Smokeless Tobacco Never Used    Labs: Recent Review Flowsheet Data    There is no flowsheet data to display.       Pulmonary Assessment Scores: Pulmonary Assessment Scores    Row Name 12/21/17 1442         ADL UCSD   ADL Phase  Entry     SOB Score total  48     Rest  1     Walk  2     Stairs  4     Bath  1     Dress  1     Shop  3       CAT Score   CAT Score  27        Pulmonary Function Assessment: Pulmonary Function Assessment - 12/21/17 1509      Initial Spirometry Results   FVC%  81 %    FEV1%  80 %    FEV1/FVC Ratio  73.13    Comments  Best of 2 attempts, good patient effort      Post Bronchodilator Spirometry Results   FVC%  81 %    FEV1%  80 %    FEV1/FVC Ratio  67.51    Comments  Best of 2 attempts, good patient effort      Breath   Bilateral Breath Sounds  Clear    Shortness of Breath  Fear of Shortness of Breath;Limiting activity;Yes       Exercise Target  Goals: Date: 12/21/17  Exercise Program Goal: Individual exercise prescription set with THRR, safety & activity barriers. Participant demonstrates ability to understand and report RPE using BORG scale, to self-measure pulse accurately, and to acknowledge the importance of the exercise prescription.  Exercise Prescription Goal: Starting with aerobic activity 30 plus minutes a day, 3 days per week for initial exercise prescription. Provide home exercise prescription and guidelines that participant acknowledges understanding prior to discharge.  Activity Barriers & Risk Stratification: Activity Barriers & Cardiac Risk Stratification - 12/21/17 1555      Activity Barriers & Cardiac Risk Stratification   Activity Barriers  Deconditioning;Muscular Weakness;Shortness of Breath       6 Minute Walk: 6 Minute Walk    Row Name 12/21/17 1552         6 Minute Walk   Phase  Initial     Distance  1333 feet     Walk Time  6 minutes     #  of Rest Breaks  0     MPH  2.52     METS  3.9     RPE  11     Perceived Dyspnea   2     VO2 Peak  13.67     Symptoms  No     Resting HR  90 bpm     Resting BP  132/74     Resting Oxygen Saturation   100 %     Exercise Oxygen Saturation  during 6 min walk  99 %     Max Ex. HR  138 bpm     Max Ex. BP  142/72     2 Minute Post BP  136/76       Interval HR   1 Minute HR  126     2 Minute HR  129     3 Minute HR  132     4 Minute HR  132     5 Minute HR  133     6 Minute HR  138     2 Minute Post HR  105     Interval Heart Rate?  Yes       Interval Oxygen   Interval Oxygen?  Yes     Baseline Oxygen Saturation %  100 %     1 Minute Oxygen Saturation %  100 %     1 Minute Liters of Oxygen  0 L Room Air     2 Minute Oxygen Saturation %  100 %     2 Minute Liters of Oxygen  0 L     3 Minute Oxygen Saturation %  99 %     3 Minute Liters of Oxygen  0 L     4 Minute Oxygen Saturation %  99 %     4 Minute Liters of Oxygen  0 L     5 Minute Oxygen  Saturation %  99 %     5 Minute Liters of Oxygen  0 L     6 Minute Oxygen Saturation %  99 %     6 Minute Liters of Oxygen  0 L     2 Minute Post Oxygen Saturation %  100 %     2 Minute Post Liters of Oxygen  0 L       Oxygen Initial Assessment: Oxygen Initial Assessment - 12/21/17 1448      Home Oxygen   Home Oxygen Device  None    Sleep Oxygen Prescription  CPAP    Liters per minute  0    Home Exercise Oxygen Prescription  None    Home at Rest Exercise Oxygen Prescription  None    Compliance with Home Oxygen Use  No    Comments  -- he has not been wearing      Initial 6 min Walk   Oxygen Used  None      Program Oxygen Prescription   Program Oxygen Prescription  None      Intervention   Short Term Goals  To learn and demonstrate proper use of respiratory medications;To learn and demonstrate proper pursed lip breathing techniques or other breathing techniques.;To learn and understand importance of monitoring SPO2 with pulse oximeter and demonstrate accurate use of the pulse oximeter.;To learn and understand importance of maintaining oxygen saturations>88%    Long  Term Goals  Verbalizes importance of monitoring SPO2 with pulse oximeter and return demonstration;Maintenance of O2 saturations>88%;Exhibits proper breathing techniques, such  as pursed lip breathing or other method taught during program session;Compliance with respiratory medication;Demonstrates proper use of MDI's       Oxygen Re-Evaluation:   Oxygen Discharge (Final Oxygen Re-Evaluation):   Initial Exercise Prescription: Initial Exercise Prescription - 12/21/17 1500      Date of Initial Exercise RX and Referring Provider   Date  12/21/17    Referring Provider  Califano, Lucy Chris [facility]      Treadmill   MPH  2.4    Grade  0.5    Minutes  15    METs  3      NuStep   Level  4    SPM  80    Minutes  15    METs  3      Recumbant Elliptical   Level  2    RPM  50    Minutes  15    METs  3       Prescription Details   Frequency (times per week)  3    Duration  Progress to 45 minutes of aerobic exercise without signs/symptoms of physical distress      Intensity   THRR 40-80% of Max Heartrate  115-140    Ratings of Perceived Exertion  11-13    Perceived Dyspnea  0-4      Progression   Progression  Continue to progress workloads to maintain intensity without signs/symptoms of physical distress.      Resistance Training   Training Prescription  Yes    Weight  4 lbs    Reps  10-15       Perform Capillary Blood Glucose checks as needed.  Exercise Prescription Changes: Exercise Prescription Changes    Row Name 12/21/17 1500             Response to Exercise   Blood Pressure (Admit)  132/74       Blood Pressure (Exercise)  142/72       Blood Pressure (Exit)  136/76       Heart Rate (Admit)  90 bpm       Heart Rate (Exercise)  138 bpm       Heart Rate (Exit)  105 bpm       Oxygen Saturation (Admit)  100 %       Oxygen Saturation (Exercise)  99 %       Oxygen Saturation (Exit)  100 %       Rating of Perceived Exertion (Exercise)  11       Perceived Dyspnea (Exercise)  2       Symptoms  none       Comments  walk test results          Exercise Comments:   Exercise Goals and Review: Exercise Goals    Row Name 12/21/17 1558             Exercise Goals   Increase Physical Activity  Yes       Intervention  Provide advice, education, support and counseling about physical activity/exercise needs.;Develop an individualized exercise prescription for aerobic and resistive training based on initial evaluation findings, risk stratification, comorbidities and participant's personal goals.       Expected Outcomes  Achievement of increased cardiorespiratory fitness and enhanced flexibility, muscular endurance and strength shown through measurements of functional capacity and personal statement of participant.       Increase Strength and Stamina  Yes       Intervention   Provide  advice, education, support and counseling about physical activity/exercise needs.;Develop an individualized exercise prescription for aerobic and resistive training based on initial evaluation findings, risk stratification, comorbidities and participant's personal goals.       Expected Outcomes  Achievement of increased cardiorespiratory fitness and enhanced flexibility, muscular endurance and strength shown through measurements of functional capacity and personal statement of participant.       Able to understand and use rate of perceived exertion (RPE) scale  Yes       Intervention  Provide education and explanation on how to use RPE scale       Expected Outcomes  Short Term: Able to use RPE daily in rehab to express subjective intensity level;Long Term:  Able to use RPE to guide intensity level when exercising independently       Able to understand and use Dyspnea scale  Yes       Intervention  Provide education and explanation on how to use Dyspnea scale       Expected Outcomes  Short Term: Able to use Dyspnea scale daily in rehab to express subjective sense of shortness of breath during exertion;Long Term: Able to use Dyspnea scale to guide intensity level when exercising independently       Knowledge and understanding of Target Heart Rate Range (THRR)  Yes       Intervention  Provide education and explanation of THRR including how the numbers were predicted and where they are located for reference       Expected Outcomes  Short Term: Able to state/look up THRR;Long Term: Able to use THRR to govern intensity when exercising independently;Short Term: Able to use daily as guideline for intensity in rehab       Able to check pulse independently  Yes       Intervention  Provide education and demonstration on how to check pulse in carotid and radial arteries.;Review the importance of being able to check your own pulse for safety during independent exercise       Expected Outcomes  Short Term:  Able to explain why pulse checking is important during independent exercise;Long Term: Able to check pulse independently and accurately       Understanding of Exercise Prescription  Yes       Intervention  Provide education, explanation, and written materials on patient's individual exercise prescription       Expected Outcomes  Short Term: Able to explain program exercise prescription;Long Term: Able to explain home exercise prescription to exercise independently          Exercise Goals Re-Evaluation :   Discharge Exercise Prescription (Final Exercise Prescription Changes): Exercise Prescription Changes - 12/21/17 1500      Response to Exercise   Blood Pressure (Admit)  132/74    Blood Pressure (Exercise)  142/72    Blood Pressure (Exit)  136/76    Heart Rate (Admit)  90 bpm    Heart Rate (Exercise)  138 bpm    Heart Rate (Exit)  105 bpm    Oxygen Saturation (Admit)  100 %    Oxygen Saturation (Exercise)  99 %    Oxygen Saturation (Exit)  100 %    Rating of Perceived Exertion (Exercise)  11    Perceived Dyspnea (Exercise)  2    Symptoms  none    Comments  walk test results       Nutrition:  Target Goals: Understanding of nutrition guidelines, daily intake of sodium 1500mg , cholesterol 200mg , calories 30% from  fat and 7% or less from saturated fats, daily to have 5 or more servings of fruits and vegetables.  Biometrics: Pre Biometrics - 12/21/17 1600      Pre Biometrics   Height  6' 1.2" (1.859 m)    Weight  180 lb 12.8 oz (82 kg)    Waist Circumference  39.5 inches    Hip Circumference  39 inches    Waist to Hip Ratio  1.01 %    BMI (Calculated)  23.73        Nutrition Therapy Plan and Nutrition Goals: Nutrition Therapy & Goals - 12/21/17 1441      Personal Nutrition Goals   Comments  Patient still has to fill out dietician forms. Patient is willing to meet with dietician. He lost weight after surgery but needs to exercise for strenght      Intervention Plan    Intervention  Prescribe, educate and counsel regarding individualized specific dietary modifications aiming towards targeted core components such as weight, hypertension, lipid management, diabetes, heart failure and other comorbidities.;Nutrition handout(s) given to patient.    Expected Outcomes  Short Term Goal: Understand basic principles of dietary content, such as calories, fat, sodium, cholesterol and nutrients.;Long Term Goal: Adherence to prescribed nutrition plan.       Nutrition Discharge: Rate Your Plate Scores:   Nutrition Goals Re-Evaluation:   Nutrition Goals Discharge (Final Nutrition Goals Re-Evaluation):   Psychosocial: Target Goals: Acknowledge presence or absence of significant depression and/or stress, maximize coping skills, provide positive support system. Participant is able to verbalize types and ability to use techniques and skills needed for reducing stress and depression.   Initial Review & Psychosocial Screening: Initial Psych Review & Screening - 12/21/17 1437      Initial Review   Current issues with  Current Depression;Current Stress Concerns;Current Sleep Concerns    Source of Stress Concerns  Financial;Chronic Illness;Unable to perform yard/household activities    Comments  VA disability check has been cut from 100% to 60 %. He is really tired and sometimes he cannot get get out of bed. His COPD does not get to him unless it flares up.      Family Dynamics   Good Support System?  Yes    Comments  His daughter and his wife are good support system.      Barriers   Psychosocial barriers to participate in program  The patient should benefit from training in stress management and relaxation.      Screening Interventions   Interventions  Yes;Program counselor consult;Provide feedback about the scores to participant;Encouraged to exercise;To provide support and resources with identified psychosocial needs    Expected Outcomes  Short Term goal: Utilizing  psychosocial counselor, staff and physician to assist with identification of specific Stressors or current issues interfering with healing process. Setting desired goal for each stressor or current issue identified.;Long Term Goal: Stressors or current issues are controlled or eliminated.;Long Term goal: The participant improves quality of Life and PHQ9 Scores as seen by post scores and/or verbalization of changes;Short Term goal: Identification and review with participant of any Quality of Life or Depression concerns found by scoring the questionnaire.       Quality of Life Scores:   PHQ-9: Recent Review Flowsheet Data    Depression screen [facility] 2/9 12/21/2017   Decreased Interest 2   Down, Depressed, Hopeless 1   PHQ - 2 Score 3   Altered sleeping 3   Tired, decreased energy 2   Change  in appetite 0   Feeling bad or failure about yourself  1   Trouble concentrating 2   Moving slowly or fidgety/restless 0   Suicidal thoughts 0   PHQ-9 Score 11   Difficult doing work/chores Somewhat difficult     Interpretation of Total Score  Total Score Depression Severity:  1-4 = Minimal depression, 5-9 = Mild depression, 10-14 = Moderate depression, 15-19 = Moderately severe depression, 20-27 = Severe depression   Psychosocial Evaluation and Intervention:   Psychosocial Re-Evaluation:   Psychosocial Discharge (Final Psychosocial Re-Evaluation):   Education: Education Goals: Education classes will be provided on a weekly basis, covering required topics. Participant will state understanding/return demonstration of topics presented.  Learning Barriers/Preferences: Learning Barriers/Preferences - 12/21/17 1444      Learning Barriers/Preferences   Learning Barriers  Sight glasses    Learning Preferences  None       Education Topics: Initial Evaluation Education: - Verbal, written and demonstration of respiratory meds, RPE/PD scales, oximetry and breathing techniques. Instruction on use  of nebulizers and MDIs: cleaning and proper use, rinsing mouth with steroid doses and importance of monitoring MDI activations.   Pulmonary Rehab from 12/21/2017 in [facility] Cardiac and Pulmonary Rehab  Date  12/21/17  Educator  [facility]  Instruction Review Code  1- Verbalizes Understanding      General Nutrition Guidelines/Fats and Fiber: -Group instruction provided by verbal, written material, models and posters to present the general guidelines for heart healthy nutrition. Gives an explanation and review of dietary fats and fiber.   Controlling Sodium/Reading Food Labels: -Group verbal and written material supporting the discussion of sodium use in heart healthy nutrition. Review and explanation with models, verbal and written materials for utilization of the food label.   Exercise Physiology & Risk Factors: - Group verbal and written instruction with models to review the exercise physiology of the cardiovascular system and associated critical values. Details cardiovascular disease risk factors and the goals associated with each risk factor.   Aerobic Exercise & Resistance Training: - Gives group verbal and written discussion on the health impact of inactivity. On the components of aerobic and resistive training programs and the benefits of this training and how to safely progress through these programs.   Flexibility, Balance, General Exercise Guidelines: - Provides group verbal and written instruction on the benefits of flexibility and balance training programs. Provides general exercise guidelines with specific guidelines to those with heart or lung disease. Demonstration and skill practice provided.   Stress Management: - Provides group verbal and written instruction about the health risks of elevated stress, cause of high stress, and healthy ways to reduce stress.   Depression: - Provides group verbal and written instruction on the correlation between heart/lung disease and depressed  mood, treatment options, and the stigmas associated with seeking treatment.   Exercise & Equipment Safety: - Individual verbal instruction and demonstration of equipment use and safety with use of the equipment.   Pulmonary Rehab from 12/21/2017 in [facility] Cardiac and Pulmonary Rehab  Date  12/21/17  Educator  [facility]  Instruction Review Code  1- Verbalizes Understanding      Infection Prevention: - Provides verbal and written material to individual with discussion of infection control including proper hand washing and proper equipment cleaning during exercise session.   Pulmonary Rehab from 12/21/2017 in [facility] Cardiac and Pulmonary Rehab  Date  12/21/17  Educator  [facility]  Instruction Review Code  1- Verbalizes Understanding      Falls Prevention: - Provides  verbal and written material to individual with discussion of falls prevention and safety.   Pulmonary Rehab from 12/21/2017 in [facility] Cardiac and Pulmonary Rehab  Date  12/21/17  Educator  [facility]  Instruction Review Code  1- Verbalizes Understanding      Diabetes: - Individual verbal and written instruction to review signs/symptoms of diabetes, desired ranges of glucose level fasting, after meals and with exercise. Advice that pre and post exercise glucose checks will be done for 3 sessions at entry of program.   Chronic Lung Diseases: - Group verbal and written instruction to review new updates, new respiratory medications, new advancements in procedures and treatments. Provide informative websites and "800" numbers of self-education.   Lung Procedures: - Group verbal and written instruction to describe testing methods done to diagnose lung disease. Review the outcome of test results. Describe the treatment choices: Pulmonary Function Tests, ABGs and oximetry.   Energy Conservation: - Provide group verbal and written instruction for methods to conserve energy, plan and organize activities. Instruct on pacing techniques, use of adaptive  equipment and posture/positioning to relieve shortness of breath.   Triggers: - Group verbal and written instruction to review types of environmental controls: home humidity, furnaces, filters, dust mite/pet prevention, HEPA vacuums. To discuss weather changes, air quality and the benefits of nasal washing.   Exacerbations: - Group verbal and written instruction to provide: warning signs, infection symptoms, calling MD promptly, preventive modes, and value of vaccinations. Review: effective airway clearance, coughing and/or vibration techniques. Create an Sports administrator.   Oxygen: - Individual and group verbal and written instruction on oxygen therapy. Includes supplement oxygen, available portable oxygen systems, continuous and intermittent flow rates, oxygen safety, concentrators, and Medicare reimbursement for oxygen.   Respiratory Medications: - Group verbal and written instruction to review medications for lung disease. Drug class, frequency, complications, importance of spacers, rinsing mouth after steroid MDI's, and proper cleaning methods for nebulizers.   AED/CPR: - Group verbal and written instruction with the use of models to demonstrate the basic use of the AED with the basic ABC's of resuscitation.   Breathing Retraining: - Provides individuals verbal and written instruction on purpose, frequency, and proper technique of diaphragmatic breathing and pursed-lipped breathing. Applies individual practice skills.   Pulmonary Rehab from 12/21/2017 in [facility] Cardiac and Pulmonary Rehab  Date  12/21/17  Educator  [facility]  Instruction Review Code  1- Verbalizes Understanding      Anatomy and Physiology of the Lungs: - Group verbal and written instruction with the use of models to provide basic lung anatomy and physiology related to function, structure and complications of lung disease.   Anatomy & Physiology of the Heart: - Group verbal and written instruction and models provide basic  cardiac anatomy and physiology, with the coronary electrical and arterial systems. Review of: AMI, Angina, Valve disease, Heart Failure, Cardiac Arrhythmia, Pacemakers, and the ICD.   Heart Failure: - Group verbal and written instruction on the basics of heart failure: signs/symptoms, treatments, explanation of ejection fraction, enlarged heart and cardiomyopathy.   Sleep Apnea: - Individual verbal and written instruction to review Obstructive Sleep Apnea. Review of risk factors, methods for diagnosing and types of masks and machines for OSA.   Anxiety: - Provides group, verbal and written instruction on the correlation between heart/lung disease and anxiety, treatment options, and management of anxiety.   Relaxation: - Provides group, verbal and written instruction about the benefits of relaxation for patients with heart/lung disease. Also provides patients with examples  of relaxation techniques.   Cardiac Medications: - Group verbal and written instruction to review commonly prescribed medications for heart disease. Reviews the medication, class of the drug, and side effects.   Know Your Numbers: -Group verbal and written instruction about important numbers in your health.  Review of Cholesterol, Blood Pressure, Diabetes, and BMI and the role they play in your overall health.   Other: -Provides group and verbal instruction on various topics (see comments)    Knowledge Questionnaire Score: Knowledge Questionnaire Score - 12/21/17 1444      Knowledge Questionnaire Score   Pre Score  15/18 reviewed with patient        Core Components/Risk Factors/Patient Goals at Admission: Personal Goals and Risk Factors at Admission - 12/21/17 1449      Core Components/Risk Factors/Patient Goals on Admission    Weight Management  Yes;Weight Maintenance    Intervention  Weight Management: Develop a combined nutrition and exercise program designed to reach desired caloric intake, while  maintaining appropriate intake of nutrient and fiber, sodium and fats, and appropriate energy expenditure required for the weight goal.;Weight Management: Provide education and appropriate resources to help participant work on and attain dietary goals.;Weight Management/Obesity: Establish reasonable short term and long term weight goals.    Admit Weight  180 lb 12.8 oz (82 kg)    Goal Weight: Short Term  175 lb (79.4 kg)    Goal Weight: Long Term  180 lb (81.6 kg)    Expected Outcomes  Short Term: Continue to assess and modify interventions until short term weight is achieved;Long Term: Adherence to nutrition and physical activity/exercise program aimed toward attainment of established weight goal;Weight Maintenance: Understanding of the daily nutrition guidelines, which includes 25-35% calories from fat, 7% or less cal from saturated fats, less than 200mg  cholesterol, less than 1.5gm of sodium, & 5 or more servings of fruits and vegetables daily;Understanding recommendations for meals to include 15-35% energy as protein, 25-35% energy from fat, 35-60% energy from carbohydrates, less than 200mg  of dietary cholesterol, 20-35 gm of total fiber daily;Understanding of distribution of calorie intake throughout the day with the consumption of 4-5 meals/snacks    Improve shortness of breath with ADL's  Yes    Intervention  Provide education, individualized exercise plan and daily activity instruction to help decrease symptoms of SOB with activities of daily living.    Expected Outcomes  Short Term: Achieves a reduction of symptoms when performing activities of daily living.    Lipids  Yes    Intervention  Provide education and support for participant on nutrition & aerobic/resistive exercise along with prescribed medications to achieve LDL 70mg , HDL >40mg .    Expected Outcomes  Short Term: Participant states understanding of desired cholesterol values and is compliant with medications prescribed. Participant is  following exercise prescription and nutrition guidelines.;Long Term: Cholesterol controlled with medications as prescribed, with individualized exercise RX and with personalized nutrition plan. Value goals: LDL < 70mg , HDL > 40 mg.       Core Components/Risk Factors/Patient Goals Review:    Core Components/Risk Factors/Patient Goals at Discharge (Final Review):    ITP Comments: ITP Comments    Row Name 12/21/17 1420           ITP Comments  Medical Evaluation completed. Chart sent for review and changes to Dr. Emily Filbert Director of [place]. Diagnosis can be found in [facility] encounter 12/21/17          Comments: Initial ITP

## 2017-12-21 NOTE — Patient Instructions (Signed)
Patient Instructions  Patient Details  Name: Samuel Moore MRN: [id number] Date of Birth: 25-Nov-1951 Referring Provider:  Jennette Kettle, MD  Below are the personal goals you chose as well as exercise and nutrition goals. Our goal is to help you keep on track towards obtaining and maintaining your goals. We will be discussing your progress on these goals with you throughout the program.  Initial Exercise Prescription: Initial Exercise Prescription - 12/21/17 1500      Date of Initial Exercise RX and Referring Provider   Date  12/21/17    Referring Provider  Califano, Lucy Chris [facility]      Treadmill   MPH  2.4    Grade  0.5    Minutes  15    METs  3      NuStep   Level  4    SPM  80    Minutes  15    METs  3      Recumbant Elliptical   Level  2    RPM  50    Minutes  15    METs  3      Prescription Details   Frequency (times per week)  3    Duration  Progress to 45 minutes of aerobic exercise without signs/symptoms of physical distress      Intensity   THRR 40-80% of Max Heartrate  115-140    Ratings of Perceived Exertion  11-13    Perceived Dyspnea  0-4      Progression   Progression  Continue to progress workloads to maintain intensity without signs/symptoms of physical distress.      Resistance Training   Training Prescription  Yes    Weight  4 lbs    Reps  10-15       Exercise Goals: Frequency: Be able to perform aerobic exercise three times per week working toward 3-5 days per week.  Intensity: Work with a perceived exertion of 11 (fairly light) - 15 (hard) as tolerated. Follow your new exercise prescription and watch for changes in prescription as you progress with the program. Changes will be reviewed with you when they are made.  Duration: You should be able to do 30 minutes of continuous aerobic exercise in addition to a 5 minute warm-up and a 5 minute cool-down routine.  Nutrition Goals: Your personal nutrition goals will be established when  you do your nutrition analysis with the dietician.  The following are nutrition guidelines to follow: Cholesterol < 200mg /day Sodium < 1500mg /day Fiber: Men over 50 yrs - 30 grams per day  Personal Goals: Personal Goals and Risk Factors at Admission - 12/21/17 1449      Core Components/Risk Factors/Patient Goals on Admission    Weight Management  Yes;Weight Maintenance    Intervention  Weight Management: Develop a combined nutrition and exercise program designed to reach desired caloric intake, while maintaining appropriate intake of nutrient and fiber, sodium and fats, and appropriate energy expenditure required for the weight goal.;Weight Management: Provide education and appropriate resources to help participant work on and attain dietary goals.;Weight Management/Obesity: Establish reasonable short term and long term weight goals.    Admit Weight  180 lb 12.8 oz (82 kg)    Goal Weight: Short Term  175 lb (79.4 kg)    Goal Weight: Long Term  180 lb (81.6 kg)    Expected Outcomes  Short Term: Continue to assess and modify interventions until short term weight is achieved;Long Term: Adherence to nutrition  and physical activity/exercise program aimed toward attainment of established weight goal;Weight Maintenance: Understanding of the daily nutrition guidelines, which includes 25-35% calories from fat, 7% or less cal from saturated fats, less than 200mg  cholesterol, less than 1.5gm of sodium, & 5 or more servings of fruits and vegetables daily;Understanding recommendations for meals to include 15-35% energy as protein, 25-35% energy from fat, 35-60% energy from carbohydrates, less than 200mg  of dietary cholesterol, 20-35 gm of total fiber daily;Understanding of distribution of calorie intake throughout the day with the consumption of 4-5 meals/snacks    Improve shortness of breath with ADL's  Yes    Intervention  Provide education, individualized exercise plan and daily activity instruction to help  decrease symptoms of SOB with activities of daily living.    Expected Outcomes  Short Term: Achieves a reduction of symptoms when performing activities of daily living.    Lipids  Yes    Intervention  Provide education and support for participant on nutrition & aerobic/resistive exercise along with prescribed medications to achieve LDL 70mg , HDL >40mg .    Expected Outcomes  Short Term: Participant states understanding of desired cholesterol values and is compliant with medications prescribed. Participant is following exercise prescription and nutrition guidelines.;Long Term: Cholesterol controlled with medications as prescribed, with individualized exercise RX and with personalized nutrition plan. Value goals: LDL < 70mg , HDL > 40 mg.       Tobacco Use Initial Evaluation: Social History   Tobacco Use  Smoking Status Former Smoker  . Packs/day: 2.00  . Years: 10.00  . Pack years: 20.00  . Types: Cigarettes  . Last attempt to quit: 12/14/2002  . Years since quitting: 15.0  Smokeless Tobacco Never Used    Exercise Goals and Review: Exercise Goals    Row Name 12/21/17 1558             Exercise Goals   Increase Physical Activity  Yes       Intervention  Provide advice, education, support and counseling about physical activity/exercise needs.;Develop an individualized exercise prescription for aerobic and resistive training based on initial evaluation findings, risk stratification, comorbidities and participant's personal goals.       Expected Outcomes  Achievement of increased cardiorespiratory fitness and enhanced flexibility, muscular endurance and strength shown through measurements of functional capacity and personal statement of participant.       Increase Strength and Stamina  Yes       Intervention  Provide advice, education, support and counseling about physical activity/exercise needs.;Develop an individualized exercise prescription for aerobic and resistive training based on  initial evaluation findings, risk stratification, comorbidities and participant's personal goals.       Expected Outcomes  Achievement of increased cardiorespiratory fitness and enhanced flexibility, muscular endurance and strength shown through measurements of functional capacity and personal statement of participant.       Able to understand and use rate of perceived exertion (RPE) scale  Yes       Intervention  Provide education and explanation on how to use RPE scale       Expected Outcomes  Short Term: Able to use RPE daily in rehab to express subjective intensity level;Long Term:  Able to use RPE to guide intensity level when exercising independently       Able to understand and use Dyspnea scale  Yes       Intervention  Provide education and explanation on how to use Dyspnea scale       Expected Outcomes  Short Term:  Able to use Dyspnea scale daily in rehab to express subjective sense of shortness of breath during exertion;Long Term: Able to use Dyspnea scale to guide intensity level when exercising independently       Knowledge and understanding of Target Heart Rate Range (THRR)  Yes       Intervention  Provide education and explanation of THRR including how the numbers were predicted and where they are located for reference       Expected Outcomes  Short Term: Able to state/look up THRR;Long Term: Able to use THRR to govern intensity when exercising independently;Short Term: Able to use daily as guideline for intensity in rehab       Able to check pulse independently  Yes       Intervention  Provide education and demonstration on how to check pulse in carotid and radial arteries.;Review the importance of being able to check your own pulse for safety during independent exercise       Expected Outcomes  Short Term: Able to explain why pulse checking is important during independent exercise;Long Term: Able to check pulse independently and accurately       Understanding of Exercise Prescription   Yes       Intervention  Provide education, explanation, and written materials on patient's individual exercise prescription       Expected Outcomes  Short Term: Able to explain program exercise prescription;Long Term: Able to explain home exercise prescription to exercise independently          Copy of goals given to participant.

## 2017-12-24 DIAGNOSIS — J449 Chronic obstructive pulmonary disease, unspecified: Secondary | ICD-10-CM

## 2017-12-24 NOTE — Progress Notes (Signed)
Daily Session Note  Patient Details  Name: Samuel Moore MRN: [id number] Date of Birth: 05-May-1951 Referring Provider:     Pulmonary Rehab from 12/21/2017 in [facility]  Referring Provider  Elenore Rota MD [[facility]]      Encounter Date: 12/24/2017  Check In: Session Check In - 12/24/17 1133      Check-In   Location  [facility]-Cardiac & Pulmonary Rehab    Staff Present  Justin Mend Lorre Nick, [place], ACSM RCEP, Exercise Physiologist;Meredith Sherryll Burger, RN BSN    Supervising physician immediately available to respond to emergencies  [facility] immediately available ER MD    Physician(s)  Dr. Alfred Levins and Corky Downs    Medication changes reported      No    Fall or balance concerns reported     No    Warm-up and Cool-down  Performed as group-led instruction    Resistance Training Performed  Yes    VAD Patient?  No      Pain Assessment   Currently in Pain?  No/denies          Social History   Tobacco Use  Smoking Status Former Smoker  . Packs/day: 2.00  . Years: 10.00  . Pack years: 20.00  . Types: Cigarettes  . Last attempt to quit: 12/14/2002  . Years since quitting: 15.0  Smokeless Tobacco Never Used    Goals Met:  Exercise tolerated well Queuing for purse lip breathing No report of cardiac concerns or symptoms Strength training completed today  Goals Unmet:  Not Applicable  Comments: First full day of exercise!  Patient was oriented to gym and equipment including functions, settings, policies, and procedures.  Patient's individual exercise prescription and treatment plan were reviewed.  All starting workloads were established based on the results of the 6 minute walk test done at initial orientation visit.  The plan for exercise progression was also introduced and progression will be customized based on patient's performance and goals.   Dr. Emily Filbert is Medical Director for [place] and [facility]  Pulmonary Rehabilitation.

## 2017-12-27 DIAGNOSIS — J449 Chronic obstructive pulmonary disease, unspecified: Secondary | ICD-10-CM

## 2017-12-27 NOTE — Progress Notes (Signed)
Pulmonary Individual Treatment Plan  Patient Details  Name: Samuel Moore MRN: [id number] Date of Birth: 1951-08-11 Referring Provider:     Pulmonary Rehab from 12/21/2017 in [facility] Cardiac and Pulmonary Rehab  Referring Provider  Califano, Lucy Chris MD [[facility]]      Initial Encounter Date:    Pulmonary Rehab from 12/21/2017 in [facility] Cardiac and Pulmonary Rehab  Date  12/21/17  Referring Provider  Elenore Rota MD [[facility]]      Visit Diagnosis: Chronic obstructive pulmonary disease, unspecified COPD type ([place])  Patient's Home Medications on Admission: No current outpatient medications on file.  Past Medical History: Past Medical History:  Diagnosis Date  . Cancer ([place])   . COPD (chronic obstructive pulmonary disease) ([place])   . Hypertension     Tobacco Use: Social History   Tobacco Use  Smoking Status Former Smoker  . Packs/day: 2.00  . Years: 10.00  . Pack years: 20.00  . Types: Cigarettes  . Last attempt to quit: 12/14/2002  . Years since quitting: 15.0  Smokeless Tobacco Never Used    Labs: Recent Review Flowsheet Data    There is no flowsheet data to display.       Pulmonary Assessment Scores: Pulmonary Assessment Scores    Row Name 12/21/17 1442         ADL UCSD   ADL Phase  Entry     SOB Score total  48     Rest  1     Walk  2     Stairs  4     Bath  1     Dress  1     Shop  3       CAT Score   CAT Score  27        Pulmonary Function Assessment: Pulmonary Function Assessment - 12/21/17 1509      Initial Spirometry Results   FVC%  81 %    FEV1%  80 %    FEV1/FVC Ratio  73.13    Comments  Best of 2 attempts, good patient effort      Post Bronchodilator Spirometry Results   FVC%  81 %    FEV1%  80 %    FEV1/FVC Ratio  67.51    Comments  Best of 2 attempts, good patient effort      Breath   Bilateral Breath Sounds  Clear    Shortness of Breath  Fear of Shortness of Breath;Limiting activity;Yes       Exercise Target Goals:     Exercise Program Goal: Individual exercise prescription set with THRR, safety & activity barriers. Participant demonstrates ability to understand and report RPE using BORG scale, to self-measure pulse accurately, and to acknowledge the importance of the exercise prescription.  Exercise Prescription Goal: Starting with aerobic activity 30 plus minutes a day, 3 days per week for initial exercise prescription. Provide home exercise prescription and guidelines that participant acknowledges understanding prior to discharge.  Activity Barriers & Risk Stratification: Activity Barriers & Cardiac Risk Stratification - 12/21/17 1555      Activity Barriers & Cardiac Risk Stratification   Activity Barriers  Deconditioning;Muscular Weakness;Shortness of Breath       6 Minute Walk: 6 Minute Walk    Row Name 12/21/17 1552         6 Minute Walk   Phase  Initial     Distance  1333 feet     Walk Time  6 minutes     #  of Rest Breaks  0     MPH  2.52     METS  3.9     RPE  11     Perceived Dyspnea   2     VO2 Peak  13.67     Symptoms  No     Resting HR  90 bpm     Resting BP  132/74     Resting Oxygen Saturation   100 %     Exercise Oxygen Saturation  during 6 min walk  99 %     Max Ex. HR  138 bpm     Max Ex. BP  142/72     2 Minute Post BP  136/76       Interval HR   1 Minute HR  126     2 Minute HR  129     3 Minute HR  132     4 Minute HR  132     5 Minute HR  133     6 Minute HR  138     2 Minute Post HR  105     Interval Heart Rate?  Yes       Interval Oxygen   Interval Oxygen?  Yes     Baseline Oxygen Saturation %  100 %     1 Minute Oxygen Saturation %  100 %     1 Minute Liters of Oxygen  0 L Room Air     2 Minute Oxygen Saturation %  100 %     2 Minute Liters of Oxygen  0 L     3 Minute Oxygen Saturation %  99 %     3 Minute Liters of Oxygen  0 L     4 Minute Oxygen Saturation %  99 %     4 Minute Liters of Oxygen  0 L     5 Minute Oxygen Saturation %  99 %      5 Minute Liters of Oxygen  0 L     6 Minute Oxygen Saturation %  99 %     6 Minute Liters of Oxygen  0 L     2 Minute Post Oxygen Saturation %  100 %     2 Minute Post Liters of Oxygen  0 L       Oxygen Initial Assessment: Oxygen Initial Assessment - 12/21/17 1448      Home Oxygen   Home Oxygen Device  None    Sleep Oxygen Prescription  CPAP    Liters per minute  0    Home Exercise Oxygen Prescription  None    Home at Rest Exercise Oxygen Prescription  None    Compliance with Home Oxygen Use  No    Comments  -- he has not been wearing      Initial 6 min Walk   Oxygen Used  None      Program Oxygen Prescription   Program Oxygen Prescription  None      Intervention   Short Term Goals  To learn and demonstrate proper use of respiratory medications;To learn and demonstrate proper pursed lip breathing techniques or other breathing techniques.;To learn and understand importance of monitoring SPO2 with pulse oximeter and demonstrate accurate use of the pulse oximeter.;To learn and understand importance of maintaining oxygen saturations>88%    Long  Term Goals  Verbalizes importance of monitoring SPO2 with pulse oximeter and return demonstration;Maintenance of O2 saturations>88%;Exhibits proper breathing techniques, such  as pursed lip breathing or other method taught during program session;Compliance with respiratory medication;Demonstrates proper use of MDI's       Oxygen Re-Evaluation: Oxygen Re-Evaluation    Row Name 12/24/17 1137             Program Oxygen Prescription   Program Oxygen Prescription  None         Home Oxygen   Home Oxygen Device  None       Sleep Oxygen Prescription  CPAP       Liters per minute  0       Home Exercise Oxygen Prescription  None       Home at Rest Exercise Oxygen Prescription  None       Compliance with Home Oxygen Use  No         Goals/Expected Outcomes   Short Term Goals  To learn and demonstrate proper use of respiratory medications;To  learn and demonstrate proper pursed lip breathing techniques or other breathing techniques.;To learn and understand importance of monitoring SPO2 with pulse oximeter and demonstrate accurate use of the pulse oximeter.;To learn and understand importance of maintaining oxygen saturations>88%       Long  Term Goals  Verbalizes importance of monitoring SPO2 with pulse oximeter and return demonstration;Maintenance of O2 saturations>88%;Exhibits proper breathing techniques, such as pursed lip breathing or other method taught during program session;Compliance with respiratory medication;Demonstrates proper use of MDI's       Comments  Reviewed PLB technique with pt.  Talked about how it work and it's important to maintaining his exercise saturations.         Goals/Expected Outcomes  Short: Become more profiecient at using PLB.   Long: Become independent at using PLB.          Oxygen Discharge (Final Oxygen Re-Evaluation): Oxygen Re-Evaluation - 12/24/17 1137      Program Oxygen Prescription   Program Oxygen Prescription  None      Home Oxygen   Home Oxygen Device  None    Sleep Oxygen Prescription  CPAP    Liters per minute  0    Home Exercise Oxygen Prescription  None    Home at Rest Exercise Oxygen Prescription  None    Compliance with Home Oxygen Use  No      Goals/Expected Outcomes   Short Term Goals  To learn and demonstrate proper use of respiratory medications;To learn and demonstrate proper pursed lip breathing techniques or other breathing techniques.;To learn and understand importance of monitoring SPO2 with pulse oximeter and demonstrate accurate use of the pulse oximeter.;To learn and understand importance of maintaining oxygen saturations>88%    Long  Term Goals  Verbalizes importance of monitoring SPO2 with pulse oximeter and return demonstration;Maintenance of O2 saturations>88%;Exhibits proper breathing techniques, such as pursed lip breathing or other method taught during program  session;Compliance with respiratory medication;Demonstrates proper use of MDI's    Comments  Reviewed PLB technique with pt.  Talked about how it work and it's important to maintaining his exercise saturations.      Goals/Expected Outcomes  Short: Become more profiecient at using PLB.   Long: Become independent at using PLB.       Initial Exercise Prescription: Initial Exercise Prescription - 12/21/17 1500      Date of Initial Exercise RX and Referring Provider   Date  12/21/17    Referring Provider  Califano, Lucy Chris [facility]      Treadmill   MPH  2.4  Grade  0.5    Minutes  15    METs  3      NuStep   Level  4    SPM  80    Minutes  15    METs  3      Recumbant Elliptical   Level  2    RPM  50    Minutes  15    METs  3      Prescription Details   Frequency (times per week)  3    Duration  Progress to 45 minutes of aerobic exercise without signs/symptoms of physical distress      Intensity   THRR 40-80% of Max Heartrate  115-140    Ratings of Perceived Exertion  11-13    Perceived Dyspnea  0-4      Progression   Progression  Continue to progress workloads to maintain intensity without signs/symptoms of physical distress.      Resistance Training   Training Prescription  Yes    Weight  4 lbs    Reps  10-15       Perform Capillary Blood Glucose checks as needed.  Exercise Prescription Changes: Exercise Prescription Changes    Row Name 12/21/17 1500             Response to Exercise   Blood Pressure (Admit)  132/74       Blood Pressure (Exercise)  142/72       Blood Pressure (Exit)  136/76       Heart Rate (Admit)  90 bpm       Heart Rate (Exercise)  138 bpm       Heart Rate (Exit)  105 bpm       Oxygen Saturation (Admit)  100 %       Oxygen Saturation (Exercise)  99 %       Oxygen Saturation (Exit)  100 %       Rating of Perceived Exertion (Exercise)  11       Perceived Dyspnea (Exercise)  2       Symptoms  none       Comments  walk test  results          Exercise Comments: Exercise Comments    Row Name 12/24/17 1136           Exercise Comments  First full day of exercise!  Patient was oriented to gym and equipment including functions, settings, policies, and procedures.  Patient's individual exercise prescription and treatment plan were reviewed.  All starting workloads were established based on the results of the 6 minute walk test done at initial orientation visit.  The plan for exercise progression was also introduced and progression will be customized based on patient's performance and goals.          Exercise Goals and Review: Exercise Goals    Row Name 12/21/17 1558             Exercise Goals   Increase Physical Activity  Yes       Intervention  Provide advice, education, support and counseling about physical activity/exercise needs.;Develop an individualized exercise prescription for aerobic and resistive training based on initial evaluation findings, risk stratification, comorbidities and participant's personal goals.       Expected Outcomes  Achievement of increased cardiorespiratory fitness and enhanced flexibility, muscular endurance and strength shown through measurements of functional capacity and personal statement of participant.       Increase Strength and Stamina  Yes       Intervention  Provide advice, education, support and counseling about physical activity/exercise needs.;Develop an individualized exercise prescription for aerobic and resistive training based on initial evaluation findings, risk stratification, comorbidities and participant's personal goals.       Expected Outcomes  Achievement of increased cardiorespiratory fitness and enhanced flexibility, muscular endurance and strength shown through measurements of functional capacity and personal statement of participant.       Able to understand and use rate of perceived exertion (RPE) scale  Yes       Intervention  Provide education and  explanation on how to use RPE scale       Expected Outcomes  Short Term: Able to use RPE daily in rehab to express subjective intensity level;Long Term:  Able to use RPE to guide intensity level when exercising independently       Able to understand and use Dyspnea scale  Yes       Intervention  Provide education and explanation on how to use Dyspnea scale       Expected Outcomes  Short Term: Able to use Dyspnea scale daily in rehab to express subjective sense of shortness of breath during exertion;Long Term: Able to use Dyspnea scale to guide intensity level when exercising independently       Knowledge and understanding of Target Heart Rate Range (THRR)  Yes       Intervention  Provide education and explanation of THRR including how the numbers were predicted and where they are located for reference       Expected Outcomes  Short Term: Able to state/look up THRR;Long Term: Able to use THRR to govern intensity when exercising independently;Short Term: Able to use daily as guideline for intensity in rehab       Able to check pulse independently  Yes       Intervention  Provide education and demonstration on how to check pulse in carotid and radial arteries.;Review the importance of being able to check your own pulse for safety during independent exercise       Expected Outcomes  Short Term: Able to explain why pulse checking is important during independent exercise;Long Term: Able to check pulse independently and accurately       Understanding of Exercise Prescription  Yes       Intervention  Provide education, explanation, and written materials on patient's individual exercise prescription       Expected Outcomes  Short Term: Able to explain program exercise prescription;Long Term: Able to explain home exercise prescription to exercise independently          Exercise Goals Re-Evaluation : Exercise Goals Re-Evaluation    Row Name 12/24/17 1137             Exercise Goal Re-Evaluation    Exercise Goals Review  Able to understand and use Dyspnea scale;Understanding of Exercise Prescription;Knowledge and understanding of Target Heart Rate Range (THRR);Able to understand and use rate of perceived exertion (RPE) scale       Comments  Reviewed RPE scale, THR and program prescription with pt today.  Pt voiced understanding and was given a copy of goals to take home.        Expected Outcomes  Short: Use RPE daily to regulate intensity.  Long: Follow program prescription in THR.          Discharge Exercise Prescription (Final Exercise Prescription Changes): Exercise Prescription Changes - 12/21/17 1500      Response to Exercise  Blood Pressure (Admit)  132/74    Blood Pressure (Exercise)  142/72    Blood Pressure (Exit)  136/76    Heart Rate (Admit)  90 bpm    Heart Rate (Exercise)  138 bpm    Heart Rate (Exit)  105 bpm    Oxygen Saturation (Admit)  100 %    Oxygen Saturation (Exercise)  99 %    Oxygen Saturation (Exit)  100 %    Rating of Perceived Exertion (Exercise)  11    Perceived Dyspnea (Exercise)  2    Symptoms  none    Comments  walk test results       Nutrition:  Target Goals: Understanding of nutrition guidelines, daily intake of sodium 1500mg , cholesterol 200mg , calories 30% from fat and 7% or less from saturated fats, daily to have 5 or more servings of fruits and vegetables.  Biometrics: Pre Biometrics - 12/21/17 1600      Pre Biometrics   Height  6' 1.2" (1.859 m)    Weight  180 lb 12.8 oz (82 kg)    Waist Circumference  39.5 inches    Hip Circumference  39 inches    Waist to Hip Ratio  1.01 %    BMI (Calculated)  23.73        Nutrition Therapy Plan and Nutrition Goals: Nutrition Therapy & Goals - 12/21/17 1441      Personal Nutrition Goals   Comments  Patient still has to fill out dietician forms. Patient is willing to meet with dietician. He lost weight after surgery but needs to exercise for strenght      Intervention Plan   Intervention   Prescribe, educate and counsel regarding individualized specific dietary modifications aiming towards targeted core components such as weight, hypertension, lipid management, diabetes, heart failure and other comorbidities.;Nutrition handout(s) given to patient.    Expected Outcomes  Short Term Goal: Understand basic principles of dietary content, such as calories, fat, sodium, cholesterol and nutrients.;Long Term Goal: Adherence to prescribed nutrition plan.       Nutrition Discharge: Rate Your Plate Scores:   Nutrition Goals Re-Evaluation:   Nutrition Goals Discharge (Final Nutrition Goals Re-Evaluation):   Psychosocial: Target Goals: Acknowledge presence or absence of significant depression and/or stress, maximize coping skills, provide positive support system. Participant is able to verbalize types and ability to use techniques and skills needed for reducing stress and depression.   Initial Review & Psychosocial Screening: Initial Psych Review & Screening - 12/21/17 1437      Initial Review   Current issues with  Current Depression;Current Stress Concerns;Current Sleep Concerns    Source of Stress Concerns  Financial;Chronic Illness;Unable to perform yard/household activities    Comments  VA disability check has been cut from 100% to 60 %. He is really tired and sometimes he cannot get get out of bed. His COPD does not get to him unless it flares up.      Family Dynamics   Good Support System?  Yes    Comments  His daughter and his wife are good support system.      Barriers   Psychosocial barriers to participate in program  The patient should benefit from training in stress management and relaxation.      Screening Interventions   Interventions  Yes;Program counselor consult;Provide feedback about the scores to participant;Encouraged to exercise;To provide support and resources with identified psychosocial needs    Expected Outcomes  Short Term goal: Utilizing psychosocial  counselor, staff and physician  to assist with identification of specific Stressors or current issues interfering with healing process. Setting desired goal for each stressor or current issue identified.;Long Term Goal: Stressors or current issues are controlled or eliminated.;Long Term goal: The participant improves quality of Life and PHQ9 Scores as seen by post scores and/or verbalization of changes;Short Term goal: Identification and review with participant of any Quality of Life or Depression concerns found by scoring the questionnaire.       Quality of Life Scores:   PHQ-9: Recent Review Flowsheet Data    Depression screen [facility] 2/9 12/21/2017   Decreased Interest 2   Down, Depressed, Hopeless 1   PHQ - 2 Score 3   Altered sleeping 3   Tired, decreased energy 2   Change in appetite 0   Feeling bad or failure about yourself  1   Trouble concentrating 2   Moving slowly or fidgety/restless 0   Suicidal thoughts 0   PHQ-9 Score 11   Difficult doing work/chores Somewhat difficult     Interpretation of Total Score  Total Score Depression Severity:  1-4 = Minimal depression, 5-9 = Mild depression, 10-14 = Moderate depression, 15-19 = Moderately severe depression, 20-27 = Severe depression   Psychosocial Evaluation and Intervention:   Psychosocial Re-Evaluation:   Psychosocial Discharge (Final Psychosocial Re-Evaluation):   Education: Education Goals: Education classes will be provided on a weekly basis, covering required topics. Participant will state understanding/return demonstration of topics presented.  Learning Barriers/Preferences: Learning Barriers/Preferences - 12/21/17 1444      Learning Barriers/Preferences   Learning Barriers  Sight glasses    Learning Preferences  None       Education Topics: Initial Evaluation Education: - Verbal, written and demonstration of respiratory meds, RPE/PD scales, oximetry and breathing techniques. Instruction on use of nebulizers  and MDIs: cleaning and proper use, rinsing mouth with steroid doses and importance of monitoring MDI activations.   Pulmonary Rehab from 12/21/2017 in [facility] Cardiac and Pulmonary Rehab  Date  12/21/17  Educator  [facility]  Instruction Review Code  1- Verbalizes Understanding      General Nutrition Guidelines/Fats and Fiber: -Group instruction provided by verbal, written material, models and posters to present the general guidelines for heart healthy nutrition. Gives an explanation and review of dietary fats and fiber.   Controlling Sodium/Reading Food Labels: -Group verbal and written material supporting the discussion of sodium use in heart healthy nutrition. Review and explanation with models, verbal and written materials for utilization of the food label.   Exercise Physiology & Risk Factors: - Group verbal and written instruction with models to review the exercise physiology of the cardiovascular system and associated critical values. Details cardiovascular disease risk factors and the goals associated with each risk factor.   Aerobic Exercise & Resistance Training: - Gives group verbal and written discussion on the health impact of inactivity. On the components of aerobic and resistive training programs and the benefits of this training and how to safely progress through these programs.   Flexibility, Balance, General Exercise Guidelines: - Provides group verbal and written instruction on the benefits of flexibility and balance training programs. Provides general exercise guidelines with specific guidelines to those with heart or lung disease. Demonstration and skill practice provided.   Stress Management: - Provides group verbal and written instruction about the health risks of elevated stress, cause of high stress, and healthy ways to reduce stress.   Depression: - Provides group verbal and written instruction on the correlation between heart/lung disease  and depressed mood, treatment  options, and the stigmas associated with seeking treatment.   Exercise & Equipment Safety: - Individual verbal instruction and demonstration of equipment use and safety with use of the equipment.   Pulmonary Rehab from 12/21/2017 in [facility] Cardiac and Pulmonary Rehab  Date  12/21/17  Educator  [facility]  Instruction Review Code  1- Verbalizes Understanding      Infection Prevention: - Provides verbal and written material to individual with discussion of infection control including proper hand washing and proper equipment cleaning during exercise session.   Pulmonary Rehab from 12/21/2017 in [facility] Cardiac and Pulmonary Rehab  Date  12/21/17  Educator  [facility]  Instruction Review Code  1- Verbalizes Understanding      Falls Prevention: - Provides verbal and written material to individual with discussion of falls prevention and safety.   Pulmonary Rehab from 12/21/2017 in [facility] Cardiac and Pulmonary Rehab  Date  12/21/17  Educator  [facility]  Instruction Review Code  1- Verbalizes Understanding      Diabetes: - Individual verbal and written instruction to review signs/symptoms of diabetes, desired ranges of glucose level fasting, after meals and with exercise. Advice that pre and post exercise glucose checks will be done for 3 sessions at entry of program.   Chronic Lung Diseases: - Group verbal and written instruction to review new updates, new respiratory medications, new advancements in procedures and treatments. Provide informative websites and "800" numbers of self-education.   Lung Procedures: - Group verbal and written instruction to describe testing methods done to diagnose lung disease. Review the outcome of test results. Describe the treatment choices: Pulmonary Function Tests, ABGs and oximetry.   Energy Conservation: - Provide group verbal and written instruction for methods to conserve energy, plan and organize activities. Instruct on pacing techniques, use of adaptive equipment and  posture/positioning to relieve shortness of breath.   Triggers: - Group verbal and written instruction to review types of environmental controls: home humidity, furnaces, filters, dust mite/pet prevention, HEPA vacuums. To discuss weather changes, air quality and the benefits of nasal washing.   Exacerbations: - Group verbal and written instruction to provide: warning signs, infection symptoms, calling MD promptly, preventive modes, and value of vaccinations. Review: effective airway clearance, coughing and/or vibration techniques. Create an Sports administrator.   Oxygen: - Individual and group verbal and written instruction on oxygen therapy. Includes supplement oxygen, available portable oxygen systems, continuous and intermittent flow rates, oxygen safety, concentrators, and Medicare reimbursement for oxygen.   Respiratory Medications: - Group verbal and written instruction to review medications for lung disease. Drug class, frequency, complications, importance of spacers, rinsing mouth after steroid MDI's, and proper cleaning methods for nebulizers.   AED/CPR: - Group verbal and written instruction with the use of models to demonstrate the basic use of the AED with the basic ABC's of resuscitation.   Breathing Retraining: - Provides individuals verbal and written instruction on purpose, frequency, and proper technique of diaphragmatic breathing and pursed-lipped breathing. Applies individual practice skills.   Pulmonary Rehab from 12/21/2017 in [facility] Cardiac and Pulmonary Rehab  Date  12/21/17  Educator  [facility]  Instruction Review Code  1- Verbalizes Understanding      Anatomy and Physiology of the Lungs: - Group verbal and written instruction with the use of models to provide basic lung anatomy and physiology related to function, structure and complications of lung disease.   Anatomy & Physiology of the Heart: - Group verbal and written instruction and models provide basic  cardiac anatomy  and physiology, with the coronary electrical and arterial systems. Review of: AMI, Angina, Valve disease, Heart Failure, Cardiac Arrhythmia, Pacemakers, and the ICD.   Heart Failure: - Group verbal and written instruction on the basics of heart failure: signs/symptoms, treatments, explanation of ejection fraction, enlarged heart and cardiomyopathy.   Sleep Apnea: - Individual verbal and written instruction to review Obstructive Sleep Apnea. Review of risk factors, methods for diagnosing and types of masks and machines for OSA.   Anxiety: - Provides group, verbal and written instruction on the correlation between heart/lung disease and anxiety, treatment options, and management of anxiety.   Relaxation: - Provides group, verbal and written instruction about the benefits of relaxation for patients with heart/lung disease. Also provides patients with examples of relaxation techniques.   Cardiac Medications: - Group verbal and written instruction to review commonly prescribed medications for heart disease. Reviews the medication, class of the drug, and side effects.   Know Your Numbers: -Group verbal and written instruction about important numbers in your health.  Review of Cholesterol, Blood Pressure, Diabetes, and BMI and the role they play in your overall health.   Other: -Provides group and verbal instruction on various topics (see comments)    Knowledge Questionnaire Score: Knowledge Questionnaire Score - 12/21/17 1444      Knowledge Questionnaire Score   Pre Score  15/18 reviewed with patient        Core Components/Risk Factors/Patient Goals at Admission: Personal Goals and Risk Factors at Admission - 12/21/17 1449      Core Components/Risk Factors/Patient Goals on Admission    Weight Management  Yes;Weight Maintenance    Intervention  Weight Management: Develop a combined nutrition and exercise program designed to reach desired caloric intake, while maintaining  appropriate intake of nutrient and fiber, sodium and fats, and appropriate energy expenditure required for the weight goal.;Weight Management: Provide education and appropriate resources to help participant work on and attain dietary goals.;Weight Management/Obesity: Establish reasonable short term and long term weight goals.    Admit Weight  180 lb 12.8 oz (82 kg)    Goal Weight: Short Term  175 lb (79.4 kg)    Goal Weight: Long Term  180 lb (81.6 kg)    Expected Outcomes  Short Term: Continue to assess and modify interventions until short term weight is achieved;Long Term: Adherence to nutrition and physical activity/exercise program aimed toward attainment of established weight goal;Weight Maintenance: Understanding of the daily nutrition guidelines, which includes 25-35% calories from fat, 7% or less cal from saturated fats, less than 200mg  cholesterol, less than 1.5gm of sodium, & 5 or more servings of fruits and vegetables daily;Understanding recommendations for meals to include 15-35% energy as protein, 25-35% energy from fat, 35-60% energy from carbohydrates, less than 200mg  of dietary cholesterol, 20-35 gm of total fiber daily;Understanding of distribution of calorie intake throughout the day with the consumption of 4-5 meals/snacks    Improve shortness of breath with ADL's  Yes    Intervention  Provide education, individualized exercise plan and daily activity instruction to help decrease symptoms of SOB with activities of daily living.    Expected Outcomes  Short Term: Achieves a reduction of symptoms when performing activities of daily living.    Lipids  Yes    Intervention  Provide education and support for participant on nutrition & aerobic/resistive exercise along with prescribed medications to achieve LDL 70mg , HDL >40mg .    Expected Outcomes  Short Term: Participant states understanding of desired cholesterol  values and is compliant with medications prescribed. Participant is following  exercise prescription and nutrition guidelines.;Long Term: Cholesterol controlled with medications as prescribed, with individualized exercise RX and with personalized nutrition plan. Value goals: LDL < 70mg , HDL > 40 mg.       Core Components/Risk Factors/Patient Goals Review:    Core Components/Risk Factors/Patient Goals at Discharge (Final Review):    ITP Comments: ITP Comments    Row Name 12/21/17 1420 12/27/17 0836         ITP Comments  Medical Evaluation completed. Chart sent for review and changes to Dr. Emily Filbert Director of [place]. Diagnosis can be found in [facility] encounter 12/21/17  30 day review completed. ITP sent to Dr. Emily Filbert Director of [place]. Continue with ITP unless changes are made by physician.         Comments: 30 day review

## 2017-12-29 DIAGNOSIS — J449 Chronic obstructive pulmonary disease, unspecified: Secondary | ICD-10-CM | POA: Diagnosis not present

## 2017-12-31 DIAGNOSIS — J449 Chronic obstructive pulmonary disease, unspecified: Secondary | ICD-10-CM

## 2017-12-31 NOTE — Progress Notes (Signed)
Daily Session Note  Patient Details  Name: GERIK COBERLY MRN: [id number] Date of Birth: August 19, 1951 Referring Provider:     Pulmonary Rehab from 12/21/2017 in [facility] Cardiac and Pulmonary Rehab  Referring Provider  Elenore Rota MD [[place]]      Encounter Date: 12/31/2017  Check In: Session Check In - 12/31/17 1124      Check-In   Location  [facility]-Cardiac & Pulmonary Rehab    Staff Present  Renita Papa, RN BSN;Jessica Luan Pulling, MA, RCEP, CCRP, Exercise Physiologist;Joseph Flavia Shipper    Supervising physician immediately available to respond to emergencies  [facility] immediately available ER MD    Physician(s)  Dr. Dia Crawford and [facility]    Medication changes reported      No    Fall or balance concerns reported     No    Warm-up and Cool-down  Performed as group-led instruction    Resistance Training Performed  Yes    VAD Patient?  No      Pain Assessment   Currently in Pain?  No/denies          Social History   Tobacco Use  Smoking Status Former Smoker  . Packs/day: 2.00  . Years: 10.00  . Pack years: 20.00  . Types: Cigarettes  . Last attempt to quit: 12/14/2002  . Years since quitting: 15.0  Smokeless Tobacco Never Used    Goals Met:  Proper associated with RPD/PD & O2 Sat Independence with exercise equipment Using PLB without cueing & demonstrates good technique Exercise tolerated well Strength training completed today  Goals Unmet:  Not Applicable  Comments: Pt able to follow exercise prescription today without complaint.  Will continue to monitor for progression.    Dr. Emily Filbert is Medical Director for [place] and [facility].

## 2018-01-03 DIAGNOSIS — J449 Chronic obstructive pulmonary disease, unspecified: Secondary | ICD-10-CM

## 2018-01-03 NOTE — Progress Notes (Signed)
Daily Session Note  Patient Details  Name: Samuel Moore MRN: [id number] Date of Birth: June 20, 1951 Referring Provider:     Pulmonary Rehab from 12/21/2017 in [facility] Cardiac and Pulmonary Rehab  Referring Provider  Elenore Rota MD [[place]]      Encounter Date: 01/03/2018  Check In: Session Check In - 01/03/18 1136      Check-In   Location  [facility]-Cardiac & Pulmonary Rehab    Staff Present  Earlean Shawl, BS, ACSM CEP, Exercise Physiologist;Amanda Oletta Darter, BA, ACSM CEP, Exercise Physiologist;Lasheika Ortloff Flavia Shipper    Supervising physician immediately available to respond to emergencies  [facility] immediately available ER MD    Physician(s)   Dr. Mable Paris and Cinda Quest    Medication changes reported      No    Fall or balance concerns reported     No    Warm-up and Cool-down  Performed as group-led instruction    Resistance Training Performed  Yes    VAD Patient?  No      Pain Assessment   Currently in Pain?  No/denies          Social History   Tobacco Use  Smoking Status Former Smoker  . Packs/day: 2.00  . Years: 10.00  . Pack years: 20.00  . Types: Cigarettes  . Last attempt to quit: 12/14/2002  . Years since quitting: 15.0  Smokeless Tobacco Never Used    Goals Met:  Independence with exercise equipment Exercise tolerated well No report of cardiac concerns or symptoms Strength training completed today  Goals Unmet:  Not Applicable  Comments: Pt able to follow exercise prescription today without complaint.  Will continue to monitor for progression.   Dr. Emily Filbert is Medical Director for [place] and [facility].

## 2018-01-05 DIAGNOSIS — J449 Chronic obstructive pulmonary disease, unspecified: Secondary | ICD-10-CM

## 2018-01-05 NOTE — Progress Notes (Signed)
Daily Session Note  Patient Details  Name: Samuel Moore MRN: [id number] Date of Birth: 02/20/1951 Referring Provider:     Pulmonary Rehab from 12/21/2017 in [facility] Cardiac and Pulmonary Rehab  Referring Provider  Califano, Sophia Grace MD [[place]]      Encounter Date: 01/05/2018  Check In: Session Check In - 01/05/18 1134      Check-In   Location  [facility]-Cardiac & Pulmonary Rehab    Staff Present  Joseph Hood RCP,RRT,BSRT;Carroll Enterkin, RN, BSN;Jessica Hawkins, MA, RCEP, CCRP, Exercise Physiologist    Supervising physician immediately available to respond to emergencies  [facility] immediately available ER MD    Physician(s)  Dr. Norman and Williams    Medication changes reported      No    Fall or balance concerns reported     No    Warm-up and Cool-down  Performed as group-led instruction    Resistance Training Performed  Yes    VAD Patient?  No      Pain Assessment   Currently in Pain?  No/denies          Social History   Tobacco Use  Smoking Status Former Smoker  . Packs/day: 2.00  . Years: 10.00  . Pack years: 20.00  . Types: Cigarettes  . Last attempt to quit: 12/14/2002  . Years since quitting: 15.0  Smokeless Tobacco Never Used    Goals Met:  Independence with exercise equipment Exercise tolerated well No report of cardiac concerns or symptoms Strength training completed today  Goals Unmet:  Not Applicable  Comments: Pt able to follow exercise prescription today without complaint.  Will continue to monitor for progression.   Dr. Mark Miller is Medical Director for [facility] and [facility]. 

## 2018-01-10 DIAGNOSIS — J449 Chronic obstructive pulmonary disease, unspecified: Secondary | ICD-10-CM | POA: Diagnosis not present

## 2018-01-10 NOTE — Progress Notes (Signed)
Daily Session Note  Patient Details  Name: Samuel Moore MRN: [id number] Date of Birth: July 15, 1951 Referring Provider:     Pulmonary Rehab from 12/21/2017 in [facility] Cardiac and Pulmonary Rehab  Referring Provider  Elenore Rota MD [[place]]      Encounter Date: 01/10/2018  Check In: Session Check In - 01/10/18 1128      Check-In   Location  [facility]-Cardiac & Pulmonary Rehab    Staff Present  Nada Maclachlan, BA, ACSM CEP, Exercise Physiologist;Kelly Amedeo Plenty, BS, ACSM CEP, Exercise Physiologist;Adelyna Brockman Flavia Shipper    Supervising physician immediately available to respond to emergencies  [facility] immediately available ER MD    Physician(s)  Dr. Lamar Laundry and Mariea Clonts    Medication changes reported      No    Fall or balance concerns reported     No    Warm-up and Cool-down  Performed as group-led instruction    Resistance Training Performed  Yes    VAD Patient?  No      Pain Assessment   Currently in Pain?  No/denies          Social History   Tobacco Use  Smoking Status Former Smoker  . Packs/day: 2.00  . Years: 10.00  . Pack years: 20.00  . Types: Cigarettes  . Last attempt to quit: 12/14/2002  . Years since quitting: 15.0  Smokeless Tobacco Never Used    Goals Met:  Independence with exercise equipment Exercise tolerated well No report of cardiac concerns or symptoms Strength training completed today  Goals Unmet:  Not Applicable  Comments: Pt able to follow exercise prescription today without complaint.  Will continue to monitor for progression.   Dr. Emily Filbert is Medical Director for [place] and [facility].

## 2018-01-12 ENCOUNTER — Encounter: Payer: Self-pay | Admitting: Dietician

## 2018-01-12 DIAGNOSIS — J449 Chronic obstructive pulmonary disease, unspecified: Secondary | ICD-10-CM | POA: Diagnosis not present

## 2018-01-12 NOTE — Progress Notes (Signed)
Daily Session Note  Patient Details  Name: Samuel Moore MRN: [id number] Date of Birth: [birth year] Referring Provider:     Pulmonary Rehab from 12/21/2017 in [facility] Cardiac and Pulmonary Rehab  Referring Provider  Elenore Rota MD [[place]]      Encounter Date: 01/12/2018  Check In: Session Check In - 01/12/18 1125      Check-In   Location  [facility]-Cardiac & Pulmonary Rehab    Staff Present  Justin Mend RCP,RRT,BSRT;Meredith Sherryll Burger, RN BSN;Jessica Luan Pulling, MA, RCEP, CCRP, Exercise Physiologist    Supervising physician immediately available to respond to emergencies  [facility] immediately available ER MD    Physician(s)   Dr. Jimmye Norman and Alfred Levins    Medication changes reported      No    Fall or balance concerns reported     No    Warm-up and Cool-down  Performed as group-led instruction    Resistance Training Performed  Yes    VAD Patient?  No      Pain Assessment   Currently in Pain?  No/denies          Social History   Tobacco Use  Smoking Status Former Smoker  . Packs/day: 2.00  . Years: 10.00  . Pack years: 20.00  . Types: Cigarettes  . Last attempt to quit: 12/14/2002  . Years since quitting: 15.0  Smokeless Tobacco Never Used    Goals Met:  Independence with exercise equipment Exercise tolerated well No report of cardiac concerns or symptoms Strength training completed today  Goals Unmet:  Not Applicable  Comments: Pt able to follow exercise prescription today without complaint.  Will continue to monitor for progression. Reviewed home exercise with pt today.  Pt plans to walk at home for exercise.  Reviewed THR, pulse, RPE, sign and symptoms, and when to call 911 or MD.  Also discussed weather considerations and indoor options.  Pt voiced understanding.   Dr. Emily Filbert is Medical Director for [place] and [facility].

## 2018-01-14 ENCOUNTER — Encounter: Payer: Non-veteran care | Attending: Internal Medicine

## 2018-01-14 DIAGNOSIS — J449 Chronic obstructive pulmonary disease, unspecified: Secondary | ICD-10-CM | POA: Diagnosis present

## 2018-01-14 DIAGNOSIS — C679 Malignant neoplasm of bladder, unspecified: Secondary | ICD-10-CM | POA: Diagnosis not present

## 2018-01-14 DIAGNOSIS — Z8701 Personal history of pneumonia (recurrent): Secondary | ICD-10-CM | POA: Insufficient documentation

## 2018-01-14 NOTE — Progress Notes (Signed)
Daily Session Note  Patient Details  Name: SANFORD LINDBLAD MRN: [id number] Date of Birth: 1951/05/21 Referring Provider:     Pulmonary Rehab from 12/21/2017 in [facility] Cardiac and Pulmonary Rehab  Referring Provider  Elenore Rota MD [[place]]      Encounter Date: 01/14/2018  Check In: Session Check In - 01/14/18 1150      Check-In   Location  [facility]-Cardiac & Pulmonary Rehab    Staff Present  Renita Papa, RN BSN;Jessica Luan Pulling, [place], RCEP, CCRP, Exercise Physiologist;Hollie Wojahn Flavia Shipper    Supervising physician immediately available to respond to emergencies  [facility] immediately available ER MD    Physician(s)   Dr. Burlene Arnt and Clearnce Hasten    Medication changes reported      No    Fall or balance concerns reported     No    Warm-up and Cool-down  Performed as group-led instruction    Resistance Training Performed  Yes    VAD Patient?  No      Pain Assessment   Currently in Pain?  No/denies          Social History   Tobacco Use  Smoking Status Former Smoker  . Packs/day: 2.00  . Years: 10.00  . Pack years: 20.00  . Types: Cigarettes  . Last attempt to quit: 12/14/2002  . Years since quitting: 15.0  Smokeless Tobacco Never Used    Goals Met:  Independence with exercise equipment Exercise tolerated well No report of cardiac concerns or symptoms Strength training completed today  Goals Unmet:  Not Applicable  Comments: Pt able to follow exercise prescription today without complaint.  Will continue to monitor for progression.   Dr. Emily Filbert is Medical Director for [place] and [facility].

## 2018-01-17 DIAGNOSIS — J449 Chronic obstructive pulmonary disease, unspecified: Secondary | ICD-10-CM | POA: Diagnosis not present

## 2018-01-17 NOTE — Progress Notes (Signed)
Daily Session Note  Patient Details  Name: Samuel Moore MRN: [id number] Date of Birth: [birth year] Referring Provider:     Pulmonary Rehab from 12/21/2017 in [facility] Cardiac and Pulmonary Rehab  Referring Provider  Elenore Rota MD [[place]]      Encounter Date: 01/17/2018  Check In: Session Check In - 01/17/18 1139      Check-In   Location  [facility]-Cardiac & Pulmonary Rehab    Staff Present  Earlean Shawl, BS, ACSM CEP, Exercise Physiologist;Marlys Stegmaier Oletta Darter, BA, ACSM CEP, Exercise Physiologist;Joseph Flavia Shipper    Supervising physician immediately available to respond to emergencies  See telemetry face sheet for immediately available ER MD    Medication changes reported      No    Fall or balance concerns reported     No    Warm-up and Cool-down  Performed on first and last piece of equipment    Resistance Training Performed  Yes    VAD Patient?  No      Pain Assessment   Currently in Pain?  No/denies    Multiple Pain Sites  No          Social History   Tobacco Use  Smoking Status Former Smoker  . Packs/day: 2.00  . Years: 10.00  . Pack years: 20.00  . Types: Cigarettes  . Last attempt to quit: 12/14/2002  . Years since quitting: 15.1  Smokeless Tobacco Never Used    Goals Met:  Independence with exercise equipment Exercise tolerated well No report of cardiac concerns or symptoms Strength training completed today  Goals Unmet:  Not Applicable  Comments: Pt able to follow exercise prescription today without complaint.  Will continue to monitor for progression.    Dr. Emily Filbert is Medical Director for [place] and [facility].

## 2018-01-19 DIAGNOSIS — J449 Chronic obstructive pulmonary disease, unspecified: Secondary | ICD-10-CM

## 2018-01-19 NOTE — Progress Notes (Signed)
Daily Session Note  Patient Details  Name: Samuel Moore MRN: [id number] Date of Birth: [birth year] Referring Provider:     Pulmonary Rehab from 12/21/2017 in [facility] Cardiac and Pulmonary Rehab  Referring Provider  Elenore Rota MD [[place]]      Encounter Date: 01/19/2018  Check In: Session Check In - 01/19/18 1206      Check-In   Location  [facility]-Cardiac & Pulmonary Rehab    Staff Present  Alberteen Sam, MA, RCEP, CCRP, Exercise Physiologist;Meredith Sherryll Burger, RN BSN;Joseph Flavia Shipper    Supervising physician immediately available to respond to emergencies  [facility] immediately available ER MD    Physician(s)  Drs. Jimmye Norman and Schaevitz    Medication changes reported      No    Fall or balance concerns reported     No    Warm-up and Cool-down  Performed as group-led Higher education careers adviser Performed  Yes    VAD Patient?  No      Pain Assessment   Currently in Pain?  No/denies          Social History   Tobacco Use  Smoking Status Former Smoker  . Packs/day: 2.00  . Years: 10.00  . Pack years: 20.00  . Types: Cigarettes  . Last attempt to quit: 12/14/2002  . Years since quitting: 15.1  Smokeless Tobacco Never Used    Goals Met:  Proper associated with RPD/PD & O2 Sat Independence with exercise equipment Using PLB without cueing & demonstrates good technique Changing diet to healthy choices, watching portion sizes No report of cardiac concerns or symptoms Strength training completed today  Goals Unmet:  Not Applicable  Comments: Pt able to follow exercise prescription today without complaint.  Will continue to monitor for progression.    Dr. Emily Filbert is Medical Director for [place] and [facility].

## 2018-01-21 ENCOUNTER — Encounter: Payer: Non-veteran care | Admitting: *Deleted

## 2018-01-21 DIAGNOSIS — J449 Chronic obstructive pulmonary disease, unspecified: Secondary | ICD-10-CM | POA: Diagnosis not present

## 2018-01-21 NOTE — Progress Notes (Signed)
Daily Session Note  Patient Details  Name: Samuel Moore MRN: [id number] Date of Birth: 01/24/1951 Referring Provider:     Pulmonary Rehab from 12/21/2017 in [facility] Cardiac and Pulmonary Rehab  Referring Provider  Califano, Sophia Grace MD [[place]]      Encounter Date: 01/21/2018  Check In: Session Check In - 01/21/18 1122      Check-In   Location  [facility]-Cardiac & Pulmonary Rehab    Staff Present  Jessica Hawkins, MA, RCEP, CCRP, Exercise Physiologist;Meredith Craven, RN BSN;Joseph Hood RCP,RRT,BSRT    Supervising physician immediately available to respond to emergencies  [facility] immediately available ER MD    Physician(s)  Drs. Lord and Malinda    Medication changes reported      No    Fall or balance concerns reported     No    Warm-up and Cool-down  Performed as group-led instruction    Resistance Training Performed  Yes    VAD Patient?  No      Pain Assessment   Currently in Pain?  No/denies          Social History   Tobacco Use  Smoking Status Former Smoker  . Packs/day: 2.00  . Years: 10.00  . Pack years: 20.00  . Types: Cigarettes  . Last attempt to quit: 12/14/2002  . Years since quitting: 15.1  Smokeless Tobacco Never Used    Goals Met:  Proper associated with RPD/PD & O2 Sat Independence with exercise equipment Using PLB without cueing & demonstrates good technique Changing diet to healthy choices, watching portion sizes Exercise tolerated well No report of cardiac concerns or symptoms Strength training completed today  Goals Unmet:  Not Applicable  Comments: Pt able to follow exercise prescription today without complaint.  Will continue to monitor for progression.    Dr. Mark Miller is Medical Director for [facility] and [facility]. 

## 2018-01-24 DIAGNOSIS — J449 Chronic obstructive pulmonary disease, unspecified: Secondary | ICD-10-CM

## 2018-01-24 NOTE — Progress Notes (Signed)
Pulmonary Individual Treatment Plan  Patient Details  Name: Samuel Moore MRN: [id number] Date of Birth: August 25, 1951 Referring Provider:     Pulmonary Rehab from 12/21/2017 in [facility] Cardiac and Pulmonary Rehab  Referring Provider  Califano, Lucy Chris MD [[facility]]      Initial Encounter Date:    Pulmonary Rehab from 12/21/2017 in [facility] Cardiac and Pulmonary Rehab  Date  12/21/17  Referring Provider  Elenore Rota MD [[facility]]      Visit Diagnosis: Chronic obstructive pulmonary disease, unspecified COPD type ([place])  Patient's Home Medications on Admission: No current outpatient medications on file.  Past Medical History: Past Medical History:  Diagnosis Date  . Cancer ([place])   . COPD (chronic obstructive pulmonary disease) ([place])   . Hypertension     Tobacco Use: Social History   Tobacco Use  Smoking Status Former Smoker  . Packs/day: 2.00  . Years: 10.00  . Pack years: 20.00  . Types: Cigarettes  . Last attempt to quit: 12/14/2002  . Years since quitting: 15.1  Smokeless Tobacco Never Used    Labs: Recent Review Flowsheet Data    There is no flowsheet data to display.       Pulmonary Assessment Scores: Pulmonary Assessment Scores    Row Name 12/21/17 1442         ADL UCSD   ADL Phase  Entry     SOB Score total  48     Rest  1     Walk  2     Stairs  4     Bath  1     Dress  1     Shop  3       CAT Score   CAT Score  27        Pulmonary Function Assessment: Pulmonary Function Assessment - 12/21/17 1509      Initial Spirometry Results   FVC%  81 %    FEV1%  80 %    FEV1/FVC Ratio  73.13    Comments  Best of 2 attempts, good patient effort      Post Bronchodilator Spirometry Results   FVC%  81 %    FEV1%  80 %    FEV1/FVC Ratio  67.51    Comments  Best of 2 attempts, good patient effort      Breath   Bilateral Breath Sounds  Clear    Shortness of Breath  Fear of Shortness of Breath;Limiting activity;Yes       Exercise Target Goals:     Exercise Program Goal: Individual exercise prescription set using results from initial 6 min walk test and THRR while considering  patient's activity barriers and safety.    Exercise Prescription Goal: Initial exercise prescription builds to 30-45 minutes a day of aerobic activity, 2-3 days per week.  Home exercise guidelines will be given to patient during program as part of exercise prescription that the participant will acknowledge.  Activity Barriers & Risk Stratification: Activity Barriers & Cardiac Risk Stratification - 12/21/17 1555      Activity Barriers & Cardiac Risk Stratification   Activity Barriers  Deconditioning;Muscular Weakness;Shortness of Breath       6 Minute Walk: 6 Minute Walk    Row Name 12/21/17 1552         6 Minute Walk   Phase  Initial     Distance  1333 feet     Walk Time  6 minutes     # of Rest Breaks  0     MPH  2.52     METS  3.9     RPE  11     Perceived Dyspnea   2     VO2 Peak  13.67     Symptoms  No     Resting HR  90 bpm     Resting BP  132/74     Resting Oxygen Saturation   100 %     Exercise Oxygen Saturation  during 6 min walk  99 %     Max Ex. HR  138 bpm     Max Ex. BP  142/72     2 Minute Post BP  136/76       Interval HR   1 Minute HR  126     2 Minute HR  129     3 Minute HR  132     4 Minute HR  132     5 Minute HR  133     6 Minute HR  138     2 Minute Post HR  105     Interval Heart Rate?  Yes       Interval Oxygen   Interval Oxygen?  Yes     Baseline Oxygen Saturation %  100 %     1 Minute Oxygen Saturation %  100 %     1 Minute Liters of Oxygen  0 L Room Air     2 Minute Oxygen Saturation %  100 %     2 Minute Liters of Oxygen  0 L     3 Minute Oxygen Saturation %  99 %     3 Minute Liters of Oxygen  0 L     4 Minute Oxygen Saturation %  99 %     4 Minute Liters of Oxygen  0 L     5 Minute Oxygen Saturation %  99 %     5 Minute Liters of Oxygen  0 L     6 Minute Oxygen Saturation %  99 %     6 Minute  Liters of Oxygen  0 L     2 Minute Post Oxygen Saturation %  100 %     2 Minute Post Liters of Oxygen  0 L       Oxygen Initial Assessment: Oxygen Initial Assessment - 12/21/17 1448      Home Oxygen   Home Oxygen Device  None    Sleep Oxygen Prescription  CPAP    Liters per minute  0    Home Exercise Oxygen Prescription  None    Home at Rest Exercise Oxygen Prescription  None    Compliance with Home Oxygen Use  No    Comments  -- he has not been wearing      Initial 6 min Walk   Oxygen Used  None      Program Oxygen Prescription   Program Oxygen Prescription  None      Intervention   Short Term Goals  To learn and demonstrate proper use of respiratory medications;To learn and demonstrate proper pursed lip breathing techniques or other breathing techniques.;To learn and understand importance of monitoring SPO2 with pulse oximeter and demonstrate accurate use of the pulse oximeter.;To learn and understand importance of maintaining oxygen saturations>88%    Long  Term Goals  Verbalizes importance of monitoring SPO2 with pulse oximeter and return demonstration;Maintenance of O2 saturations>88%;Exhibits proper breathing techniques, such as pursed lip breathing  or other method taught during program session;Compliance with respiratory medication;Demonstrates proper use of MDI's       Oxygen Re-Evaluation: Oxygen Re-Evaluation    Row Name 12/24/17 1137 01/17/18 1716           Program Oxygen Prescription   Program Oxygen Prescription  None  None        Home Oxygen   Home Oxygen Device  None  None      Sleep Oxygen Prescription  CPAP  CPAP      Liters per minute  0  0      Home Exercise Oxygen Prescription  None  None      Home at Rest Exercise Oxygen Prescription  None  None      Compliance with Home Oxygen Use  No  No        Goals/Expected Outcomes   Short Term Goals  To learn and demonstrate proper use of respiratory medications;To learn and demonstrate proper pursed lip  breathing techniques or other breathing techniques.;To learn and understand importance of monitoring SPO2 with pulse oximeter and demonstrate accurate use of the pulse oximeter.;To learn and understand importance of maintaining oxygen saturations>88%  To learn and demonstrate proper use of respiratory medications;To learn and demonstrate proper pursed lip breathing techniques or other breathing techniques.;To learn and understand importance of monitoring SPO2 with pulse oximeter and demonstrate accurate use of the pulse oximeter.;To learn and understand importance of maintaining oxygen saturations>88%      Long  Term Goals  Verbalizes importance of monitoring SPO2 with pulse oximeter and return demonstration;Maintenance of O2 saturations>88%;Exhibits proper breathing techniques, such as pursed lip breathing or other method taught during program session;Compliance with respiratory medication;Demonstrates proper use of MDI's  Verbalizes importance of monitoring SPO2 with pulse oximeter and return demonstration;Maintenance of O2 saturations>88%;Exhibits proper breathing techniques, such as pursed lip breathing or other method taught during program session;Compliance with respiratory medication;Demonstrates proper use of MDI's      Comments  Reviewed PLB technique with pt.  Talked about how it work and it's important to maintaining his exercise saturations.    Samuel Moore stated that when he went to the hospital the CPAP pressure that they used was too much and he coudl not tolerate it. He says his machine is not set that high and has been trying to wear it more.      Goals/Expected Outcomes  Short: Become more profiecient at using PLB.   Long: Become independent at using PLB.  Short: wear CPAP more often. Long: Wear CPAP everynight for sleep         Oxygen Discharge (Final Oxygen Re-Evaluation): Oxygen Re-Evaluation - 01/17/18 1716      Program Oxygen Prescription   Program Oxygen Prescription  None      Home  Oxygen   Home Oxygen Device  None    Sleep Oxygen Prescription  CPAP    Liters per minute  0    Home Exercise Oxygen Prescription  None    Home at Rest Exercise Oxygen Prescription  None    Compliance with Home Oxygen Use  No      Goals/Expected Outcomes   Short Term Goals  To learn and demonstrate proper use of respiratory medications;To learn and demonstrate proper pursed lip breathing techniques or other breathing techniques.;To learn and understand importance of monitoring SPO2 with pulse oximeter and demonstrate accurate use of the pulse oximeter.;To learn and understand importance of maintaining oxygen saturations>88%    Long  Term Goals  Verbalizes importance of monitoring SPO2 with pulse oximeter and return demonstration;Maintenance of O2 saturations>88%;Exhibits proper breathing techniques, such as pursed lip breathing or other method taught during program session;Compliance with respiratory medication;Demonstrates proper use of MDI's    Comments  Samuel Moore stated that when he went to the hospital the CPAP pressure that they used was too much and he coudl not tolerate it. He says his machine is not set that high and has been trying to wear it more.    Goals/Expected Outcomes  Short: wear CPAP more often. Long: Wear CPAP everynight for sleep       Initial Exercise Prescription: Initial Exercise Prescription - 12/21/17 1500      Date of Initial Exercise RX and Referring Provider   Date  12/21/17    Referring Provider  [facility]      Treadmill   MPH  2.4    Grade  0.5    Minutes  15    METs  3      NuStep   Level  4    SPM  80    Minutes  15    METs  3      Recumbant Elliptical   Level  2    RPM  50    Minutes  15    METs  3      Prescription Details   Frequency (times per week)  3    Duration  Progress to 45 minutes of aerobic exercise without signs/symptoms of physical distress      Intensity   THRR 40-80% of Max Heartrate  115-140    Ratings of  Perceived Exertion  11-13    Perceived Dyspnea  0-4      Progression   Progression  Continue to progress workloads to maintain intensity without signs/symptoms of physical distress.      Resistance Training   Training Prescription  Yes    Weight  4 lbs    Reps  10-15       Perform Capillary Blood Glucose checks as needed.  Exercise Prescription Changes: Exercise Prescription Changes    Row Name 12/21/17 1500 12/29/17 0700 01/12/18 1200         Response to Exercise   Blood Pressure (Admit)  132/74  138/68  122/62     Blood Pressure (Exercise)  142/72  138/70  -     Blood Pressure (Exit)  136/76  126/60  114/62     Heart Rate (Admit)  90 bpm  99 bpm  101 bpm     Heart Rate (Exercise)  138 bpm  133 bpm  136 bpm     Heart Rate (Exit)  105 bpm  100 bpm  107 bpm     Oxygen Saturation (Admit)  100 %  99 %  100 %     Oxygen Saturation (Exercise)  99 %  97 %  99 %     Oxygen Saturation (Exit)  100 %  99 %  99 %     Rating of Perceived Exertion (Exercise)  _0 Perceived Dyspnea (Exercise)  _1 Symptoms  none  none  none     Comments  walk test results  first full day of exercise  -     Duration  -  Continue with 45 min of aerobic exercise without signs/symptoms of physical distress.  Continue with 45 min of  aerobic exercise without signs/symptoms of physical distress.     Intensity  -  THRR unchanged  THRR unchanged       Progression   Progression  -  Continue to progress workloads to maintain intensity without signs/symptoms of physical distress.  Continue to progress workloads to maintain intensity without signs/symptoms of physical distress.     Average METs  -  2.2  3.17       Resistance Training   Training Prescription  -  Yes  Yes     Weight  -  4 lbs  4 lbs     Reps  -  10-15  10-15       Interval Training   Interval Training  -  No  No       Treadmill   MPH  -  2.4  2.4     Grade  -  0.5  0.5     Minutes  -  15  15     METs  -  3  3        NuStep   Level  -  4  4     SPM  -  59  99     Minutes  -  15  15     METs  -  2.1  4.2       Recumbant Elliptical   Level  -  2  2     RPM  -  33  65     Minutes  -  15  15     METs  -  1.5  2.3       Home Exercise Plan   Plans to continue exercise at  -  -  Home (comment) walking     Frequency  -  -  Add 1 additional day to program exercise sessions.     Initial Home Exercises Provided  -  -  01/12/18        Exercise Comments: Exercise Comments    Row Name 12/24/17 1136           Exercise Comments  First full day of exercise!  Patient was oriented to gym and equipment including functions, settings, policies, and procedures.  Patient's individual exercise prescription and treatment plan were reviewed.  All starting workloads were established based on the results of the 6 minute walk test done at initial orientation visit.  The plan for exercise progression was also introduced and progression will be customized based on patient's performance and goals.          Exercise Goals and Review: Exercise Goals    Row Name 12/21/17 1558             Exercise Goals   Increase Physical Activity  Yes       Intervention  Provide advice, education, support and counseling about physical activity/exercise needs.;Develop an individualized exercise prescription for aerobic and resistive training based on initial evaluation findings, risk stratification, comorbidities and participant's personal goals.       Expected Outcomes  Achievement of increased cardiorespiratory fitness and enhanced flexibility, muscular endurance and strength shown through measurements of functional capacity and personal statement of participant.       Increase Strength and Stamina  Yes       Intervention  Provide advice, education, support and counseling about physical activity/exercise needs.;Develop an individualized exercise prescription for aerobic and resistive training based on initial evaluation findings, risk  stratification, comorbidities and participant's  personal goals.       Expected Outcomes  Achievement of increased cardiorespiratory fitness and enhanced flexibility, muscular endurance and strength shown through measurements of functional capacity and personal statement of participant.       Able to understand and use rate of perceived exertion (RPE) scale  Yes       Intervention  Provide education and explanation on how to use RPE scale       Expected Outcomes  Short Term: Able to use RPE daily in rehab to express subjective intensity level;Long Term:  Able to use RPE to guide intensity level when exercising independently       Able to understand and use Dyspnea scale  Yes       Intervention  Provide education and explanation on how to use Dyspnea scale       Expected Outcomes  Short Term: Able to use Dyspnea scale daily in rehab to express subjective sense of shortness of breath during exertion;Long Term: Able to use Dyspnea scale to guide intensity level when exercising independently       Knowledge and understanding of Target Heart Rate Range (THRR)  Yes       Intervention  Provide education and explanation of THRR including how the numbers were predicted and where they are located for reference       Expected Outcomes  Short Term: Able to state/look up THRR;Long Term: Able to use THRR to govern intensity when exercising independently;Short Term: Able to use daily as guideline for intensity in rehab       Able to check pulse independently  Yes       Intervention  Provide education and demonstration on how to check pulse in carotid and radial arteries.;Review the importance of being able to check your own pulse for safety during independent exercise       Expected Outcomes  Short Term: Able to explain why pulse checking is important during independent exercise;Long Term: Able to check pulse independently and accurately       Understanding of Exercise Prescription  Yes       Intervention  Provide  education, explanation, and written materials on patient's individual exercise prescription       Expected Outcomes  Short Term: Able to explain program exercise prescription;Long Term: Able to explain home exercise prescription to exercise independently          Exercise Goals Re-Evaluation : Exercise Goals Re-Evaluation    Row Name 12/24/17 1137 12/29/17 0752 01/12/18 1228         Exercise Goal Re-Evaluation   Exercise Goals Review  Able to understand and use Dyspnea scale;Understanding of Exercise Prescription;Knowledge and understanding of Target Heart Rate Range (THRR);Able to understand and use rate of perceived exertion (RPE) scale  Increase Physical Activity;Increase Strength and Stamina;Understanding of Exercise Prescription  Increase Physical Activity;Able to understand and use Dyspnea scale;Understanding of Exercise Prescription;Increase Strength and Stamina;Knowledge and understanding of Target Heart Rate Range (THRR);Able to understand and use rate of perceived exertion (RPE) scale;Able to check pulse independently     Comments  Reviewed RPE scale, THR and program prescription with pt today.  Pt voiced understanding and was given a copy of goals to take home.   Samuel Moore has completed his first full day of exercise.  He was able to do the full 45 min with minimal rest between stations.  We will continue to monitor his progression.   Samuel Moore is off to a good start in rehab.  He  has already noticed that his strength and stamina are starting to get better.  He is enjoying coming to exercise.  Currently, he is not doing home exericse, but has been able to increase his activity levels at home.  Reviewed home exercise with pt today.  Pt plans to walk at home for exercise.  Reviewed THR, pulse, RPE, sign and symptoms, and when to call 911 or MD.  Also discussed weather considerations and indoor options.  Pt voiced understanding.     Expected Outcomes  Short: Use RPE daily to regulate intensity.  Long:  Follow program prescription in THR.  Short: Continue to attend [facility] regularly.  Long: Continue to follow program prescription.   Short: Add in at least one extra day a week at home for exercise.  Long: Continue to increase physcial activity.         Discharge Exercise Prescription (Final Exercise Prescription Changes): Exercise Prescription Changes - 01/12/18 1200      Response to Exercise   Blood Pressure (Admit)  122/62    Blood Pressure (Exit)  114/62    Heart Rate (Admit)  101 bpm    Heart Rate (Exercise)  136 bpm    Heart Rate (Exit)  107 bpm    Oxygen Saturation (Admit)  100 %    Oxygen Saturation (Exercise)  99 %    Oxygen Saturation (Exit)  99 %    Rating of Perceived Exertion (Exercise)  11    Perceived Dyspnea (Exercise)  1    Symptoms  none    Duration  Continue with 45 min of aerobic exercise without signs/symptoms of physical distress.    Intensity  THRR unchanged      Progression   Progression  Continue to progress workloads to maintain intensity without signs/symptoms of physical distress.    Average METs  3.17      Resistance Training   Training Prescription  Yes    Weight  4 lbs    Reps  10-15      Interval Training   Interval Training  No      Treadmill   MPH  2.4    Grade  0.5    Minutes  15    METs  3      NuStep   Level  4    SPM  99    Minutes  15    METs  4.2      Recumbant Elliptical   Level  2    RPM  65    Minutes  15    METs  2.3      Home Exercise Plan   Plans to continue exercise at  Home (comment) walking    Frequency  Add 1 additional day to program exercise sessions.    Initial Home Exercises Provided  01/12/18       Nutrition:  Target Goals: Understanding of nutrition guidelines, daily intake of sodium <1514m, cholesterol <206m calories 30% from fat and 7% or less from saturated fats, daily to have 5 or more servings of fruits and vegetables.  Biometrics: Pre Biometrics - 12/21/17 1600      Pre Biometrics    Height  6' 1.2" (1.859 m)    Weight  180 lb 12.8 oz (82 kg)    Waist Circumference  39.5 inches    Hip Circumference  39 inches    Waist to Hip Ratio  1.01 %    BMI (Calculated)  23.73  Nutrition Therapy Plan and Nutrition Goals: Nutrition Therapy & Goals - 01/12/18 1511      Personal Nutrition Goals   Nutrition Goal  Plan to eat a vegetable and/or a fruit with each meal. If canned, choose low-sodium vegetables and low-sugar fruits    Personal Goal #2  Switch to using 2% milk instead of whole milk.    Personal Goal #3  Decrease sugar from drinks -- this might help with appetite for solid foods. Try diet Dr. Malachi Bonds, and can try decreasing sugar used in tea gradually    Comments  Samuel Moore reports improving appetite, but not up to normal yet. Discussed some strategies to help appetite as well as breathing. Encouraged him to eat foods that taste good to him, but make some healthy changes as able.        Nutrition Assessments:   Nutrition Goals Re-Evaluation: Nutrition Goals Re-Evaluation    Lynn Name 01/17/18 1718             Goals   Current Weight  180 lb (81.6 kg)       Nutrition Goal  Eat less sugar and eat more vegetables.       Comment  Samuel Moore has met with the dietician and has gave him some good insight on the things he should be eating.       Expected Outcome  Short: decrease sugar intake from drinks. Long: try to drink diet sodas or maintain cutting out sugary drinks.          Nutrition Goals Discharge (Final Nutrition Goals Re-Evaluation): Nutrition Goals Re-Evaluation - 01/17/18 1718      Goals   Current Weight  180 lb (81.6 kg)    Nutrition Goal  Eat less sugar and eat more vegetables.    Comment  Samuel Moore has met with the dietician and has gave him some good insight on the things he should be eating.    Expected Outcome  Short: decrease sugar intake from drinks. Long: try to drink diet sodas or maintain cutting out sugary drinks.        Psychosocial: Target Goals: Acknowledge presence or absence of significant depression and/or stress, maximize coping skills, provide positive support system. Participant is able to verbalize types and ability to use techniques and skills needed for reducing stress and depression.   Initial Review & Psychosocial Screening: Initial Psych Review & Screening - 12/21/17 1437      Initial Review   Current issues with  Current Depression;Current Stress Concerns;Current Sleep Concerns    Source of Stress Concerns  Financial;Chronic Illness;Unable to perform yard/household activities    Comments  [employer] disability check has been cut from 100% to 60 %. He is really tired and sometimes he cannot get get out of bed. His COPD does not get to him unless it flares up.      Family Dynamics   Good Support System?  Yes    Comments  His daughter and his wife are good support system.      Barriers   Psychosocial barriers to participate in program  The patient should benefit from training in stress management and relaxation.      Screening Interventions   Interventions  Yes;Program counselor consult;Provide feedback about the scores to participant;Encouraged to exercise;To provide support and resources with identified psychosocial needs    Expected Outcomes  Short Term goal: Utilizing psychosocial counselor, staff and physician to assist with identification of specific Stressors or current issues interfering with healing process. Setting  desired goal for each stressor or current issue identified.;Long Term Goal: Stressors or current issues are controlled or eliminated.;Long Term goal: The participant improves quality of Life and PHQ9 Scores as seen by post scores and/or verbalization of changes;Short Term goal: Identification and review with participant of any Quality of Life or Depression concerns found by scoring the questionnaire.       Quality of Life Scores:  Scores of 19 and below usually indicate a  poorer quality of life in these areas.  A difference of  2-3 points is a clinically meaningful difference.  A difference of 2-3 points in the total score of the Quality of Life Index has been associated with significant improvement in overall quality of life, self-image, physical symptoms, and general health in studies assessing change in quality of life.  PHQ-9: Recent Review Flowsheet Data    Depression screen [facility] 2/9 12/21/2017   Decreased Interest 2   Down, Depressed, Hopeless 1   PHQ - 2 Score 3   Altered sleeping 3   Tired, decreased energy 2   Change in appetite 0   Feeling bad or failure about yourself  1   Trouble concentrating 2   Moving slowly or fidgety/restless 0   Suicidal thoughts 0   PHQ-9 Score 11   Difficult doing work/chores Somewhat difficult     Interpretation of Total Score  Total Score Depression Severity:  1-4 = Minimal depression, 5-9 = Mild depression, 10-14 = Moderate depression, 15-19 = Moderately severe depression, 20-27 = Severe depression   Psychosocial Evaluation and Intervention:   Psychosocial Re-Evaluation: Psychosocial Re-Evaluation    [place] Name 01/17/18 1721             Psychosocial Re-Evaluation   Current issues with  Current Stress Concerns;Current Depression;Current Sleep Concerns       Comments  Samuel Moore states that his stress levels have not changed too much since the start of the program. He is coming to the program regularly and has been improving. He always has a good attitude and even jokes a little in class.       Expected Outcomes  Short: attend [facility] to reduce stress. Long: maintain an exercise routine to decrease or eliminate stress.       Interventions  Encouraged to attend Pulmonary Rehabilitation for the exercise       Continue Psychosocial Services   Follow up required by staff          Psychosocial Discharge (Final Psychosocial Re-Evaluation): Psychosocial Re-Evaluation - 01/17/18 1721      Psychosocial Re-Evaluation    Current issues with  Current Stress Concerns;Current Depression;Current Sleep Concerns    Comments  Samuel Moore states that his stress levels have not changed too much since the start of the program. He is coming to the program regularly and has been improving. He always has a good attitude and even jokes a little in class.    Expected Outcomes  Short: attend [facility] to reduce stress. Long: maintain an exercise routine to decrease or eliminate stress.    Interventions  Encouraged to attend Pulmonary Rehabilitation for the exercise    Continue Psychosocial Services   Follow up required by staff       Education: Education Goals: Education classes will be provided on a weekly basis, covering required topics. Participant will state understanding/return demonstration of topics presented.  Learning Barriers/Preferences: Learning Barriers/Preferences - 12/21/17 1444      Learning Barriers/Preferences   Learning Barriers  Sight glasses  Learning Preferences  None       Education Topics:  Initial Evaluation Education: - Verbal, written and demonstration of respiratory meds, oximetry and breathing techniques. Instruction on use of nebulizers and MDIs and importance of monitoring MDI activations.   Pulmonary Rehab from 01/17/2018 in [facility] Cardiac and Pulmonary Rehab  Date  12/21/17  Educator  [facility]  Instruction Review Code  1- Verbalizes Understanding      General Nutrition Guidelines/Fats and Fiber: -Group instruction provided by verbal, written material, models and posters to present the general guidelines for heart healthy nutrition. Gives an explanation and review of dietary fats and fiber.   Pulmonary Rehab from 01/17/2018 in [facility] Cardiac and Pulmonary Rehab  Date  01/10/18  Educator  CR  Instruction Review Code  1- Verbalizes Understanding      Controlling Sodium/Reading Food Labels: -Group verbal and written material supporting the discussion of sodium use in heart healthy nutrition.  Review and explanation with models, verbal and written materials for utilization of the food label.   Pulmonary Rehab from 01/17/2018 in [facility] Cardiac and Pulmonary Rehab  Date  01/17/18  Educator  CR  Instruction Review Code  1- Verbalizes Understanding      Exercise Physiology & General Exercise Guidelines: - Group verbal and written instruction with models to review the exercise physiology of the cardiovascular system and associated critical values. Provides general exercise guidelines with specific guidelines to those with heart or lung disease.    Aerobic Exercise & Resistance Training: - Gives group verbal and written instruction on the various components of exercise. Focuses on aerobic and resistive training programs and the benefits of this training and how to safely progress through these programs.   Pulmonary Rehab from 01/17/2018 in [facility] Cardiac and Pulmonary Rehab  Date  12/31/17  Educator  [facility]  Instruction Review Code  1- Verbalizes Understanding      Flexibility, Balance, Mind/Body Relaxation: Provides group verbal/written instruction on the benefits of flexibility and balance training, including mind/body exercise modes such as yoga, pilates and tai chi.  Demonstration and skill practice provided.   Stress and Anxiety: - Provides group verbal and written instruction about the health risks of elevated stress and causes of high stress.  Discuss the correlation between heart/lung disease and anxiety and treatment options. Review healthy ways to manage with stress and anxiety.   Depression: - Provides group verbal and written instruction on the correlation between heart/lung disease and depressed mood, treatment options, and the stigmas associated with seeking treatment.   Exercise & Equipment Safety: - Individual verbal instruction and demonstration of equipment use and safety with use of the equipment.   Pulmonary Rehab from 01/17/2018 in [facility] Cardiac and Pulmonary Rehab   Date  12/21/17  Educator  [facility]  Instruction Review Code  1- Verbalizes Understanding      Infection Prevention: - Provides verbal and written material to individual with discussion of infection control including proper hand washing and proper equipment cleaning during exercise session.   Pulmonary Rehab from 01/17/2018 in [facility] Cardiac and Pulmonary Rehab  Date  12/21/17  Educator  [facility]  Instruction Review Code  1- Verbalizes Understanding      Falls Prevention: - Provides verbal and written material to individual with discussion of falls prevention and safety.   Pulmonary Rehab from 01/17/2018 in [facility] Cardiac and Pulmonary Rehab  Date  12/21/17  Educator  [facility]  Instruction Review Code  1- Verbalizes Understanding      Diabetes: -  Individual verbal and written instruction to review signs/symptoms of diabetes, desired ranges of glucose level fasting, after meals and with exercise. Advice that pre and post exercise glucose checks will be done for 3 sessions at entry of program.   Chronic Lung Diseases: - Group verbal and written instruction to review updates, respiratory medications, advancements in procedures and treatments. Discuss use of supplemental oxygen including available portable oxygen systems, continuous and intermittent flow rates, concentrators, personal use and safety guidelines. Review proper use of inhaler and spacers. Provide informative websites for self-education.    Energy Conservation: - Provide group verbal and written instruction for methods to conserve energy, plan and organize activities. Instruct on pacing techniques, use of adaptive equipment and posture/positioning to relieve shortness of breath.   Triggers and Exacerbations: - Group verbal and written instruction to review types of environmental triggers and ways to prevent exacerbations. Discuss weather changes, air quality and the benefits of nasal washing. Review warning signs and symptoms to help prevent  infections. Discuss techniques for effective airway clearance, coughing, and vibrations.   AED/CPR: - Group verbal and written instruction with the use of models to demonstrate the basic use of the AED with the basic ABC's of resuscitation.   Anatomy and Physiology of the Lungs: - Group verbal and written instruction with the use of models to provide basic lung anatomy and physiology related to function, structure and complications of lung disease.   Anatomy & Physiology of the Heart: - Group verbal and written instruction and models provide basic cardiac anatomy and physiology, with the coronary electrical and arterial systems. Review of Valvular disease and Heart Failure   Pulmonary Rehab from 01/17/2018 in [facility] Cardiac and Pulmonary Rehab  Date  01/12/18  Educator  [facility]  Instruction Review Code  1- Verbalizes Understanding      Cardiac Medications: - Group verbal and written instruction to review commonly prescribed medications for heart disease. Reviews the medication, class of the drug, and side effects.   Know Your Numbers and Risk Factors: -Group verbal and written instruction about important numbers in your health.  Discussion of what are risk factors and how they play a role in the disease process.  Review of Cholesterol, Blood Pressure, Diabetes, and BMI and the role they play in your overall health.   Sleep Hygiene: -Provides group verbal and written instruction about how sleep can affect your health.  Define sleep hygiene, discuss sleep cycles and impact of sleep habits. Review good sleep hygiene tips.    Other: -Provides group and verbal instruction on various topics (see comments)   Pulmonary Rehab from 01/17/2018 in [facility] Cardiac and Pulmonary Rehab  Date  01/05/18  Educator  [facility]  Instruction Review Code  1- Verbalizes Understanding [SLEEP]       Knowledge Questionnaire Score: Knowledge Questionnaire Score - 12/21/17 1444      Knowledge Questionnaire Score   Pre  Score  15/18 reviewed with patient        Core Components/Risk Factors/Patient Goals at Admission: Personal Goals and Risk Factors at Admission - 12/21/17 1449      Core Components/Risk Factors/Patient Goals on Admission    Weight Management  Yes;Weight Maintenance    Intervention  Weight Management: Develop a combined nutrition and exercise program designed to reach desired caloric intake, while maintaining appropriate intake of nutrient and fiber, sodium and fats, and appropriate energy expenditure required for the weight goal.;Weight Management: Provide education and appropriate resources to help participant work on and attain dietary goals.;Weight  Management/Obesity: Establish reasonable short term and long term weight goals.    Admit Weight  180 lb 12.8 oz (82 kg)    Goal Weight: Short Term  175 lb (79.4 kg)    Goal Weight: Long Term  180 lb (81.6 kg)    Expected Outcomes  Short Term: Continue to assess and modify interventions until short term weight is achieved;Long Term: Adherence to nutrition and physical activity/exercise program aimed toward attainment of established weight goal;Weight Maintenance: Understanding of the daily nutrition guidelines, which includes 25-35% calories from fat, 7% or less cal from saturated fats, less than 26m cholesterol, less than 1.5gm of sodium, & 5 or more servings of fruits and vegetables daily;Understanding recommendations for meals to include 15-35% energy as protein, 25-35% energy from fat, 35-60% energy from carbohydrates, less than 2060mof dietary cholesterol, 20-35 gm of total fiber daily;Understanding of distribution of calorie intake throughout the day with the consumption of 4-5 meals/snacks    Improve shortness of breath with ADL's  Yes    Intervention  Provide education, individualized exercise plan and daily activity instruction to help decrease symptoms of SOB with activities of daily living.    Expected Outcomes  Short Term: Achieves a  reduction of symptoms when performing activities of daily living.    Lipids  Yes    Intervention  Provide education and support for participant on nutrition & aerobic/resistive exercise along with prescribed medications to achieve LDL <7084mHDL >68m8m  Expected Outcomes  Short Term: Participant states understanding of desired cholesterol values and is compliant with medications prescribed. Participant is following exercise prescription and nutrition guidelines.;Long Term: Cholesterol controlled with medications as prescribed, with individualized exercise RX and with personalized nutrition plan. Value goals: LDL < 70mg71mL > 40 mg.       Core Components/Risk Factors/Patient Goals Review:  Goals and Risk Factor Review    Row Name 01/17/18 1714             Core Components/Risk Factors/Patient Goals Review   Personal Goals Review  Weight Management/Obesity;Improve shortness of breath with ADL's;Lipids       Review  [place] levels on all exercises. His shortness of breath has got a little better doing things around the house. His blood pressure has been within normal limits.       Expected Outcomes  Short: attend [facility] regularly. Long: maintain exercise after [facility].          Core Components/Risk Factors/Patient Goals at Discharge (Final Review):  Goals and Risk Factor Review - 01/17/18 1714      Core Components/Risk Factors/Patient Goals Review   Personal Goals Review  Weight Management/Obesity;Improve shortness of breath with ADL's;Lipids    Review  Garve[place]amarbeen doing great in [place] improving [place] levels on all exercises. His shortness of breath has got a little better doing things around the house. His blood pressure has been within normal limits.    Expected Outcomes  Short: attend [facility] regularly. Long: maintain exercise after [facility].       ITP Comments: ITP Comments    Row Name 12/21/17 1420  12/27/17 0836 12/31/17 1246 01/24/18 0937     ITP Comments  Medical Evaluation completed. Chart sent for review and changes to Dr. Mark Emily Filbertctor of [facility]osis can be found in [facility] encounter 12/21/17  30 day review completed. ITP sent to Dr. Mark Emily Filbertctor of [facility] with ITP  unless changes are made by physician.  [place] has informed us that he has a small aortic aneurysm that the doctors are going to keep an eye on. He states that he is not restricted from [place].  30 day review completed. ITP sent to Dr. Emily Filbert Director of [place]. Continue with ITP unless changes are made by physician.       Comments: 30 day review

## 2018-01-24 NOTE — Progress Notes (Signed)
Daily Session Note  Patient Details  Name: Samuel Moore MRN: [id number] Date of Birth: Mar 17, 1951 Referring Provider:     Pulmonary Rehab from 12/21/2017 in [facility] Cardiac and Pulmonary Rehab  Referring Provider  Elenore Rota MD [[place]]      Encounter Date: 01/24/2018  Check In: Session Check In - 01/24/18 1224      Check-In   Location  [facility]-Cardiac & Pulmonary Rehab    Staff Present  Earlean Shawl, BS, ACSM CEP, Exercise Physiologist;Amanda Oletta Darter, BA, ACSM CEP, Exercise Physiologist;Joseph Flavia Shipper    Supervising physician immediately available to respond to emergencies  [facility] immediately available ER MD    Physician(s)  Drs. Kinner and Rifenbark    Medication changes reported      No    Fall or balance concerns reported     No    Warm-up and Cool-down  Performed as group-led Higher education careers adviser Performed  Yes    VAD Patient?  No      Pain Assessment   Currently in Pain?  No/denies          Social History   Tobacco Use  Smoking Status Former Smoker  . Packs/day: 2.00  . Years: 10.00  . Pack years: 20.00  . Types: Cigarettes  . Last attempt to quit: 12/14/2002  . Years since quitting: 15.1  Smokeless Tobacco Never Used    Goals Met:  Independence with exercise equipment Exercise tolerated well No report of cardiac concerns or symptoms Strength training completed today  Goals Unmet:  Not Applicable  Comments: Pt able to follow exercise prescription today without complaint.  Will continue to monitor for progression.   Dr. Emily Filbert is Medical Director for [place] and [facility].

## 2018-01-26 DIAGNOSIS — J449 Chronic obstructive pulmonary disease, unspecified: Secondary | ICD-10-CM

## 2018-01-26 NOTE — Progress Notes (Signed)
Daily Session Note  Patient Details  Name: Samuel Moore MRN: [id number] Date of Birth: Oct 24, 1951 Referring Provider:     Pulmonary Rehab from 12/21/2017 in [facility] Cardiac and Pulmonary Rehab  Referring Provider  Elenore Rota MD [[place]]      Encounter Date: 01/26/2018  Check In: Session Check In - 01/26/18 1059      Check-In   Location  [facility]-Cardiac & Pulmonary Rehab    Staff Present  Alberteen Sam, MA, RCEP, CCRP, Exercise Physiologist;Meredith Sherryll Burger, RN BSN;Mikalia Fessel Flavia Shipper    Supervising physician immediately available to respond to emergencies  [facility] immediately available ER MD    Physician(s)  Dr. Burlene Arnt and Alfred Levins    Medication changes reported      No    Fall or balance concerns reported     No    Warm-up and Cool-down  Performed as group-led instruction    Resistance Training Performed  Yes    VAD Patient?  No      Pain Assessment   Currently in Pain?  No/denies        Exercise Prescription Changes - 01/25/18 1400      Response to Exercise   Blood Pressure (Admit)  122/70    Blood Pressure (Exit)  112/64    Heart Rate (Admit)  112 bpm    Heart Rate (Exercise)  132 bpm    Heart Rate (Exit)  92 bpm    Oxygen Saturation (Admit)  100 %    Oxygen Saturation (Exercise)  94 %    Oxygen Saturation (Exit)  98 %    Rating of Perceived Exertion (Exercise)  13    Perceived Dyspnea (Exercise)  2    Symptoms  none    Duration  Continue with 45 min of aerobic exercise without signs/symptoms of physical distress.    Intensity  THRR unchanged      Progression   Progression  Continue to progress workloads to maintain intensity without signs/symptoms of physical distress.    Average METs  3.32      Resistance Training   Training Prescription  Yes    Weight  4 lbs    Reps  10-15      Interval Training   Interval Training  No      Treadmill   MPH  2.5    Grade  1    Minutes  15    METs  3.26      NuStep   Level  4    SPM  100    Minutes   15    METs  4.4      Recumbant Elliptical   Level  2    RPM  63    Minutes  15    METs  2.3      Home Exercise Plan   Plans to continue exercise at  Home (comment) walking    Frequency  Add 1 additional day to program exercise sessions.    Initial Home Exercises Provided  01/12/18       Social History   Tobacco Use  Smoking Status Former Smoker  . Packs/day: 2.00  . Years: 10.00  . Pack years: 20.00  . Types: Cigarettes  . Last attempt to quit: 12/14/2002  . Years since quitting: 15.1  Smokeless Tobacco Never Used    Goals Met:  Independence with exercise equipment Exercise tolerated well No report of cardiac concerns or symptoms Strength training completed today  Goals Unmet:  Not Applicable  Comments: Pt able to follow exercise prescription today without complaint.  Will continue to monitor for progression.   Dr. Mark Miller is Medical Director for [facility] and [facility]. 

## 2018-01-28 DIAGNOSIS — J449 Chronic obstructive pulmonary disease, unspecified: Secondary | ICD-10-CM | POA: Diagnosis not present

## 2018-01-28 NOTE — Progress Notes (Signed)
Daily Session Note  Patient Details  Name: Samuel Moore MRN: [id number] Date of Birth: 1951/07/21 Referring Provider:     Pulmonary Rehab from 12/21/2017 in [facility] Cardiac and Pulmonary Rehab  Referring Provider  Elenore Rota MD [[place]]      Encounter Date: 01/28/2018  Check In: Session Check In - 01/28/18 1129      Check-In   Location  [facility]-Cardiac & Pulmonary Rehab    Staff Present  Renita Papa, RN BSN;Shaniqwa Horsman Darrin Nipper, [place], RCEP, [place], Exercise Physiologist    Supervising physician immediately available to respond to emergencies  [facility] immediately available ER MD    Physician(s)  Dr. Jacqualine Code and Quentin Cornwall    Medication changes reported      No    Fall or balance concerns reported     No    Warm-up and Cool-down  Performed as group-led instruction    Resistance Training Performed  Yes    VAD Patient?  No      Pain Assessment   Currently in Pain?  No/denies          Social History   Tobacco Use  Smoking Status Former Smoker  . Packs/day: 2.00  . Years: 10.00  . Pack years: 20.00  . Types: Cigarettes  . Last attempt to quit: 12/14/2002  . Years since quitting: 15.1  Smokeless Tobacco Never Used    Goals Met:  Independence with exercise equipment Exercise tolerated well No report of cardiac concerns or symptoms Strength training completed today  Goals Unmet:  Not Applicable  Comments: Pt able to follow exercise prescription today without complaint.  Will continue to monitor for progression.   Dr. Emily Filbert is Medical Director for [place] and [facility].

## 2018-01-31 DIAGNOSIS — J449 Chronic obstructive pulmonary disease, unspecified: Secondary | ICD-10-CM | POA: Diagnosis not present

## 2018-01-31 NOTE — Progress Notes (Signed)
Daily Session Note  Patient Details  Name: Samuel Moore MRN: [id number] Date of Birth: 04-08-1951 Referring Provider:     Pulmonary Rehab from 12/21/2017 in [facility] Cardiac and Pulmonary Rehab  Referring Provider  Elenore Rota MD [[place]]      Encounter Date: 01/31/2018  Check In: Session Check In - 01/31/18 1142      Check-In   Location  [facility]-Cardiac & Pulmonary Rehab    Staff Present  Nada Maclachlan, BA, ACSM CEP, Exercise Physiologist;Kelly Amedeo Plenty, BS, ACSM CEP, Exercise Physiologist;Cruise Baumgardner Flavia Shipper    Supervising physician immediately available to respond to emergencies  [facility] immediately available ER MD    Physician(s)  Jimmye Norman and Alfred Levins    Medication changes reported      No    Fall or balance concerns reported     No    Tobacco Cessation  No Change    Warm-up and Cool-down  Performed as group-led instruction    Resistance Training Performed  Yes    VAD Patient?  No      Pain Assessment   Currently in Pain?  No/denies          Social History   Tobacco Use  Smoking Status Former Smoker  . Packs/day: 2.00  . Years: 10.00  . Pack years: 20.00  . Types: Cigarettes  . Last attempt to quit: 12/14/2002  . Years since quitting: 15.1  Smokeless Tobacco Never Used    Goals Met:  Independence with exercise equipment Exercise tolerated well No report of cardiac concerns or symptoms Strength training completed today  Goals Unmet:  Not Applicable  Comments: Pt able to follow exercise prescription today without complaint.  Will continue to monitor for progression.   Dr. Emily Filbert is Medical Director for [place] and [facility].

## 2018-02-02 DIAGNOSIS — J449 Chronic obstructive pulmonary disease, unspecified: Secondary | ICD-10-CM

## 2018-02-02 NOTE — Progress Notes (Signed)
Daily Session Note  Patient Details  Name: Samuel Moore MRN: [id number] Date of Birth: 05/17/1951 Referring Provider:     Pulmonary Rehab from 12/21/2017 in [facility] Cardiac and Pulmonary Rehab  Referring Provider  Elenore Rota MD [[place]]      Encounter Date: 02/02/2018  Check In: Session Check In - 02/02/18 1130      Check-In   Location  [facility]-Cardiac & Pulmonary Rehab    Staff Present  Renita Papa, RN BSN;Joseph Darrin Nipper, [place], RCEP, [place], Exercise Physiologist    Supervising physician immediately available to respond to emergencies  [facility] immediately available ER MD    Physician(s)  Dr. Reita Cliche and Quentin Cornwall    Medication changes reported      No    Fall or balance concerns reported     No    Warm-up and Cool-down  Performed as group-led instruction    Resistance Training Performed  Yes    VAD Patient?  No      Pain Assessment   Currently in Pain?  No/denies          Social History   Tobacco Use  Smoking Status Former Smoker  . Packs/day: 2.00  . Years: 10.00  . Pack years: 20.00  . Types: Cigarettes  . Last attempt to quit: 12/14/2002  . Years since quitting: 15.1  Smokeless Tobacco Never Used    Goals Met:  Proper associated with RPD/PD & O2 Sat Independence with exercise equipment Using PLB without cueing & demonstrates good technique Exercise tolerated well Strength training completed today  Goals Unmet:  Not Applicable  Comments: Pt able to follow exercise prescription today without complaint.  Will continue to monitor for progression.    Dr. Emily Filbert is Medical Director for [place] and [facility].

## 2018-02-04 DIAGNOSIS — J449 Chronic obstructive pulmonary disease, unspecified: Secondary | ICD-10-CM | POA: Diagnosis not present

## 2018-02-04 NOTE — Progress Notes (Signed)
Daily Session Note  Patient Details  Name: Samuel Moore MRN: [id number] Date of Birth: 02-02-1951 Referring Provider:     Pulmonary Rehab from 12/21/2017 in [facility] Cardiac and Pulmonary Rehab  Referring Provider  Elenore Rota MD [[place]]      Encounter Date: 02/04/2018  Check In: Session Check In - 02/04/18 1149      Check-In   Location  [facility]-Cardiac & Pulmonary Rehab    Staff Present  Renita Papa, RN BSN;Royce Sciara Darrin Nipper, [place], RCEP, [place], Exercise Physiologist    Supervising physician immediately available to respond to emergencies  [facility] immediately available ER MD    Physician(s)  Dr. Jacqualine Code and [facility]    Medication changes reported      No    Fall or balance concerns reported     No    Warm-up and Cool-down  Performed as group-led instruction    Resistance Training Performed  Yes    VAD Patient?  No      Pain Assessment   Currently in Pain?  No/denies          Social History   Tobacco Use  Smoking Status Former Smoker  . Packs/day: 2.00  . Years: 10.00  . Pack years: 20.00  . Types: Cigarettes  . Last attempt to quit: 12/14/2002  . Years since quitting: 15.1  Smokeless Tobacco Never Used    Goals Met:  Independence with exercise equipment Exercise tolerated well No report of cardiac concerns or symptoms Strength training completed today  Goals Unmet:  Not Applicable  Comments: Pt able to follow exercise prescription today without complaint.  Will continue to monitor for progression.   Dr. Emily Filbert is Medical Director for [place] and [facility].

## 2018-02-07 DIAGNOSIS — J449 Chronic obstructive pulmonary disease, unspecified: Secondary | ICD-10-CM | POA: Diagnosis not present

## 2018-02-07 NOTE — Progress Notes (Signed)
Daily Session Note  Patient Details  Name: Samuel Moore MRN: [id number] Date of Birth: [birth year] Referring Provider:     Pulmonary Rehab from 12/21/2017 in [facility] Cardiac and Pulmonary Rehab  Referring Provider  Elenore Rota MD [[place]]      Encounter Date: 02/07/2018  Check In: Session Check In - 02/07/18 1142      Check-In   Location  [facility]Cardiac & Pulmonary Rehab    Staff Present  Earlean Shawl, BS, ACSM CEP, Exercise Physiologist;Amanda Oletta Darter, BA, ACSM CEP, Exercise Physiologist;Nikkole Placzek Flavia Shipper    Supervising physician immediately available to respond to emergencies  [facility] immediately available ER MD    Physician(s)  Dr. Reita Cliche and Mariea Clonts    Medication changes reported      No    Fall or balance concerns reported     No    Warm-up and Cool-down  Performed as group-led instruction    Resistance Training Performed  Yes    VAD Patient?  No      Pain Assessment   Currently in Pain?  No/denies          Social History   Tobacco Use  Smoking Status Former Smoker  . Packs/day: 2.00  . Years: 10.00  . Pack years: 20.00  . Types: Cigarettes  . Last attempt to quit: 12/14/2002  . Years since quitting: 15.1  Smokeless Tobacco Never Used    Goals Met:  Independence with exercise equipment Exercise tolerated well No report of cardiac concerns or symptoms Strength training completed today  Goals Unmet:  Not Applicable  Comments: Pt able to follow exercise prescription today without complaint.  Will continue to monitor for progression.   Dr. Emily Filbert is Medical Director for [place] and [facility].

## 2018-02-08 DIAGNOSIS — J449 Chronic obstructive pulmonary disease, unspecified: Secondary | ICD-10-CM

## 2018-02-09 DIAGNOSIS — J449 Chronic obstructive pulmonary disease, unspecified: Secondary | ICD-10-CM

## 2018-02-09 NOTE — Progress Notes (Signed)
Daily Session Note  Patient Details  Name: Samuel Moore MRN: [id number] Date of Birth: December 17, 1950 Referring Provider:     Pulmonary Rehab from 12/21/2017 in [facility] Cardiac and Pulmonary Rehab  Referring Provider  Elenore Rota MD [[place]]      Encounter Date: 02/09/2018  Check In: Session Check In - 02/09/18 1131      Check-In   Location  [facility]Cardiac & Pulmonary Rehab    Staff Present  Justin Mend RCP,RRT,BSRT;Meredith Sherryll Burger, RN BSN;Jessica Luan Pulling, MA, RCEP, CCRP, Exercise Physiologist    Supervising physician immediately available to respond to emergencies  [facility] immediately available ER MD    Physician(s)  Dr. Reita Cliche and Jimmye Norman    Medication changes reported      No    Fall or balance concerns reported     No    Tobacco Cessation  No Change    Warm-up and Cool-down  Performed as group-led instruction    Resistance Training Performed  Yes    VAD Patient?  No      Pain Assessment   Currently in Pain?  No/denies          Social History   Tobacco Use  Smoking Status Former Smoker  . Packs/day: 2.00  . Years: 10.00  . Pack years: 20.00  . Types: Cigarettes  . Last attempt to quit: 12/14/2002  . Years since quitting: 15.1  Smokeless Tobacco Never Used    Goals Met:  Independence with exercise equipment Exercise tolerated well No report of cardiac concerns or symptoms Strength training completed today  Goals Unmet:  Not Applicable  Comments: Pt able to follow exercise prescription today without complaint.  Will continue to monitor for progression.   Dr. Emily Filbert is Medical Director for [place] and [facility].

## 2018-02-11 ENCOUNTER — Encounter: Payer: Non-veteran care | Attending: Internal Medicine | Admitting: *Deleted

## 2018-02-11 DIAGNOSIS — C679 Malignant neoplasm of bladder, unspecified: Secondary | ICD-10-CM | POA: Diagnosis not present

## 2018-02-11 DIAGNOSIS — J449 Chronic obstructive pulmonary disease, unspecified: Secondary | ICD-10-CM | POA: Insufficient documentation

## 2018-02-11 DIAGNOSIS — Z8701 Personal history of pneumonia (recurrent): Secondary | ICD-10-CM | POA: Insufficient documentation

## 2018-02-11 NOTE — Progress Notes (Signed)
Daily Session Note  Patient Details  Name: Samuel Moore MRN: [id number] Date of Birth: [birth year] Referring Provider:     Pulmonary Rehab from 12/21/2017 in [facility] Cardiac and Pulmonary Rehab  Referring Provider  Elenore Rota MD [[place]]      Encounter Date: 02/11/2018  Check In: Session Check In - 02/11/18 1124      Check-In   Location  [facility]Cardiac [facility] Pulmonary Re[facility]    Staff Present  Renita Papa, RN Geralyn Corwin, RN BSN;Jessica Luan Pulling, [place], RCEP, CCRP, Exercise Physiologist    Supervising physician immediately available to respond to emergencies  [facility] immediately available ER MD    Physician(s)  Drs. Paduchowski and Schaevitz    Medication changes reported      No    Fall or balance concerns reported     No    Tobacco Cessation  No Change    Warm-up and Cool-down  Performed as group-led Higher education careers adviser Performed  Yes    VAD Patient?  No      Pain Assessment   Currently in Pain?  No/denies    Multiple Pain Sites  No          Social History   Tobacco Use  Smoking Status Former Smoker  . Packs/day: 2.00  . Years: 10.00  . Pack years: 20.00  . Types: Cigarettes  . Last attempt to quit: 12/14/2002  . Years since quitting: 15.1  Smokeless Tobacco Never Used    Goals Met:  Proper associated with RPD/PD & O2 Sat Independence with exercise equipment Using PLB without cueing & demonstrates good technique Exercise tolerated well Strength training completed today  Goals Unmet:  Not Applicable  Comments: Pt able to follow exercise prescription today without complaint.  Will continue to monitor for progression.    Dr. Emily Filbert is Medical Director for [place] and [facility].

## 2018-02-14 ENCOUNTER — Encounter: Payer: Non-veteran care | Admitting: *Deleted

## 2018-02-14 DIAGNOSIS — J449 Chronic obstructive pulmonary disease, unspecified: Secondary | ICD-10-CM | POA: Diagnosis not present

## 2018-02-14 NOTE — Progress Notes (Signed)
Daily Session Note  Patient Details  Name: Samuel Moore MRN: [id number] Date of Birth: 29-Jul-1951 Referring Provider:     Pulmonary Rehab from 12/21/2017 in [facility] Cardiac and Pulmonary Rehab  Referring Provider  Elenore Rota MD [[place]]      Encounter Date: 02/14/2018  Check In: Session Check In - 02/14/18 1140      Check-In   Location  [facility]Cardiac [facility] Pulmonary Rehab    Staff Present  Nada Maclachlan, BA, ACSM CEP, Exercise Physiologist;Kelly Amedeo Plenty, BS, ACSM CEP, Exercise Physiologist;Joseph Flavia Shipper    Supervising physician immediately available to respond to emergencies  [facility] immediately available ER MD    Physician(s)  Drs. McShane and Williams    Medication changes reported      No    Fall or balance concerns reported     No    Warm-up and Cool-down  Performed as group-led Higher education careers adviser Performed  Yes    VAD Patient?  No      Pain Assessment   Currently in Pain?  No/denies          Social History   Tobacco Use  Smoking Status Former Smoker  . Packs/day: 2.00  . Years: 10.00  . Pack years: 20.00  . Types: Cigarettes  . Last attempt to quit: 12/14/2002  . Years since quitting: 15.1  Smokeless Tobacco Never Used    Goals Met:  Proper associated with RPD/PD & O2 Sat Independence with exercise equipment Using PLB without cueing & demonstrates good technique Exercise tolerated well No report of cardiac concerns or symptoms Strength training completed today  Goals Unmet:  Not Applicable  Comments: Pt able to follow exercise prescription today without complaint.  Will continue to monitor for progression.    Dr. Emily Filbert is Medical Director for [place] and [facility].

## 2018-02-16 ENCOUNTER — Encounter: Payer: Non-veteran care | Admitting: *Deleted

## 2018-02-16 DIAGNOSIS — J449 Chronic obstructive pulmonary disease, unspecified: Secondary | ICD-10-CM

## 2018-02-16 NOTE — Progress Notes (Signed)
Daily Session Note  Patient Details  Name: Samuel Moore MRN: [id number] Date of Birth: 1951-08-25 Referring Provider:     Pulmonary Rehab from 12/21/2017 in [facility] Cardiac and Pulmonary Rehab  Referring Provider  Elenore Rota MD [[place]]      Encounter Date: 02/16/2018  Check In: Session Check In - 02/16/18 1129      Check-In   Location  [facility]iac [facility] Pulmonary Rehab    Staff Present  Renita Papa, RN BSN;Jessica Luan Pulling, MA, RCEP, CCRP, Exercise Physiologist;Joseph Flavia Shipper    Supervising physician immediately available to respond to emergencies  [facility] immediately available ER MD    Physician(s)  Dr. Joni Fears and Jimmye Norman     Medication changes reported      No    Fall or balance concerns reported     No    Warm-up and Cool-down  Performed as group-led instruction    Resistance Training Performed  Yes    VAD Patient?  No      Pain Assessment   Currently in Pain?  No/denies          Social History   Tobacco Use  Smoking Status Former Smoker  . Packs/day: 2.00  . Years: 10.00  . Pack years: 20.00  . Types: Cigarettes  . Last attempt to quit: 12/14/2002  . Years since quitting: 15.1  Smokeless Tobacco Never Used    Goals Met:  Proper associated with RPD/PD & O2 Sat Independence with exercise equipment Using PLB without cueing & demonstrates good technique Exercise tolerated well Strength training completed today  Goals Unmet:  Not Applicable  Comments: Pt able to follow exercise prescription today without complaint.  Will continue to monitor for progression.    Dr. Emily Filbert is Medical Director for [place] and [facility].

## 2018-02-18 ENCOUNTER — Encounter: Payer: Non-veteran care | Admitting: *Deleted

## 2018-02-18 DIAGNOSIS — J449 Chronic obstructive pulmonary disease, unspecified: Secondary | ICD-10-CM

## 2018-02-18 NOTE — Progress Notes (Signed)
Daily Session Note  Patient Details  Name: Samuel Moore MRN: [id number] Date of Birth: [birth year] Referring Provider:     Pulmonary Rehab from 12/21/2017 in [facility] Cardiac and Pulmonary Rehab  Referring Provider  Elenore Rota MD [[place]]      Encounter Date: 02/18/2018  Check In: Session Check In - 02/18/18 1201      Check-In   Location  [facility]Cardiac [facility] Pulmonary Rehab    Staff Present  Renita Papa, RN BSN;Jessica Luan Pulling, MA, RCEP, CCRP, Exercise Physiologist;Mary Kellie Shropshire, RN, BSN, MA    Supervising physician immediately available to respond to emergencies  [facility] immediately available ER MD    Physician(s)  Dr. Reita Cliche and Archie Balboa     Medication changes reported      No    Fall or balance concerns reported     No    Warm-up and Cool-down  Performed as group-led instruction    Resistance Training Performed  Yes    VAD Patient?  No      Pain Assessment   Currently in Pain?  No/denies          Social History   Tobacco Use  Smoking Status Former Smoker  . Packs/day: 2.00  . Years: 10.00  . Pack years: 20.00  . Types: Cigarettes  . Last attempt to quit: 12/14/2002  . Years since quitting: 15.1  Smokeless Tobacco Never Used    Goals Met:  Proper associated with RPD/PD & O2 Sat Independence with exercise equipment Using PLB without cueing & demonstrates good technique Exercise tolerated well Strength training completed today  Goals Unmet:  Not Applicable  Comments: Pt able to follow exercise prescription today without complaint.  Will continue to monitor for progression.    Dr. Emily Filbert is Medical Director for [place] and [facility].

## 2018-02-21 DIAGNOSIS — J449 Chronic obstructive pulmonary disease, unspecified: Secondary | ICD-10-CM

## 2018-02-21 NOTE — Progress Notes (Signed)
Daily Session Note  Patient Details  Name: Samuel Moore MRN: [id number] Date of Birth: 14-Jul-1951 Referring Provider:     Pulmonary Rehab from 12/21/2017 in [facility] Cardiac and Pulmonary Rehab  Referring Provider  Elenore Rota MD [[place]]      Encounter Date: 02/21/2018  Check In: Session Check In - 02/21/18 1147      Check-In   Location  [facility]Cardiac & Pulmonary Rehab    Staff Present  Earlean Shawl, BS, ACSM CEP, Exercise Physiologist;Babygirl Trager Oletta Darter, BA, ACSM CEP, Exercise Physiologist;Joseph Flavia Shipper    Supervising physician immediately available to respond to emergencies  See telemetry face sheet for immediately available ER MD    Medication changes reported      No    Fall or balance concerns reported     No    Warm-up and Cool-down  Performed on first and last piece of equipment    Resistance Training Performed  Yes    VAD Patient?  No      Pain Assessment   Currently in Pain?  No/denies    Multiple Pain Sites  No          Social History   Tobacco Use  Smoking Status Former Smoker  . Packs/day: 2.00  . Years: 10.00  . Pack years: 20.00  . Types: Cigarettes  . Last attempt to quit: 12/14/2002  . Years since quitting: 15.2  Smokeless Tobacco Never Used    Goals Met:  Independence with exercise equipment Exercise tolerated well No report of cardiac concerns or symptoms Strength training completed today  Goals Unmet:  Not Applicable  Comments: Pt able to follow exercise prescription today without complaint.  Will continue to monitor for progression.    Dr. Emily Filbert is Medical Director for [place] and [facility].

## 2018-02-21 NOTE — Progress Notes (Signed)
Pulmonary Individual Treatment Plan  Patient Details  Name: Samuel Moore MRN: [id number] Date of Birth: 06/20/1951 Referring Provider:     Pulmonary Rehab from 12/21/2017 in [facility]  Referring Provider  Califano, Lucy Chris MD [[facility]]      Initial Encounter Date:    Pulmonary Rehab from 12/21/2017 in [facility] Cardiac and Pulmonary Rehab  Date  12/21/17  Referring Provider  Elenore Rota MD [[facility]]      Visit Diagnosis: Chronic obstructive pulmonary disease, unspecified COPD type ([place])  Patient's Home Medications on Admission: No current outpatient medications on file.  Past Medical History: Past Medical History:  Diagnosis Date  . Cancer ([place])   . COPD (chronic obstructive pulmonary disease) ([place])   . Hypertension     Tobacco Use: Social History   Tobacco Use  Smoking Status Former Smoker  . Packs/day: 2.00  . Years: 10.00  . Pack years: 20.00  . Types: Cigarettes  . Last attempt to quit: 12/14/2002  . Years since quitting: 15.2  Smokeless Tobacco Never Used    Labs: Recent Review Flowsheet Data    There is no flowsheet data to display.       Pulmonary Assessment Scores: Pulmonary Assessment Scores    Row Name 12/21/17 1442 01/31/18 1143 02/08/18 1504     ADL UCSD   ADL Phase  Entry  Mid  Entry   SOB Score total  48  54  -   Rest  1  1  -   Walk  2  2  -   Stairs  4  5  -   Bath  1  2  -   Dress  1  1  -   Shop  3  2  -     CAT Score   CAT Score  27  -  -     mMRC Score   mMRC Score  -  -  2      Pulmonary Function Assessment: Pulmonary Function Assessment - 12/21/17 1509      Initial Spirometry Results   FVC%  81 %    FEV1%  80 %    FEV1/FVC Ratio  73.13    Comments  Best of 2 attempts, good patient effort      Post Bronchodilator Spirometry Results   FVC%  81 %    FEV1%  80 %    FEV1/FVC Ratio  67.51    Comments  Best of 2 attempts, good patient effort      Breath   Bilateral Breath Sounds  Clear    Shortness of Breath  Fear of Shortness of Breath;Limiting activity;Yes       Exercise Target Goals:    Exercise Program Goal: Individual exercise prescription set using results from initial 6 min walk test and THRR while considering  patient's activity barriers and safety.    Exercise Prescription Goal: Initial exercise prescription builds to 30-45 minutes a day of aerobic activity, 2-3 days per week.  Home exercise guidelines will be given to patient during program as part of exercise prescription that the participant will acknowledge.  Activity Barriers & Risk Stratification: Activity Barriers & Cardiac Risk Stratification - 12/21/17 1555      Activity Barriers & Cardiac Risk Stratification   Activity Barriers  Deconditioning;Muscular Weakness;Shortness of Breath       6 Minute Walk: 6 Minute Walk    Row Name 12/21/17 1552  6 Minute Walk   Phase  Initial     Distance  1333 feet     Walk Time  6 minutes     # of Rest Breaks  0     MPH  2.52     METS  3.9     RPE  11     Perceived Dyspnea   2     VO2 Peak  13.67     Symptoms  No     Resting HR  90 bpm     Resting BP  132/74     Resting Oxygen Saturation   100 %     Exercise Oxygen Saturation  during 6 min walk  99 %     Max Ex. HR  138 bpm     Max Ex. BP  142/72     2 Minute Post BP  136/76       Interval HR   1 Minute HR  126     2 Minute HR  129     3 Minute HR  132     4 Minute HR  132     5 Minute HR  133     6 Minute HR  138     2 Minute Post HR  105     Interval Heart Rate?  Yes       Interval Oxygen   Interval Oxygen?  Yes     Baseline Oxygen Saturation %  100 %     1 Minute Oxygen Saturation %  100 %     1 Minute Liters of Oxygen  0 L Room Air     2 Minute Oxygen Saturation %  100 %     2 Minute Liters of Oxygen  0 L     3 Minute Oxygen Saturation %  99 %     3 Minute Liters of Oxygen  0 L     4 Minute Oxygen Saturation %  99 %     4 Minute Liters of Oxygen  0 L     5 Minute Oxygen  Saturation %  99 %     5 Minute Liters of Oxygen  0 L     6 Minute Oxygen Saturation %  99 %     6 Minute Liters of Oxygen  0 L     2 Minute Post Oxygen Saturation %  100 %     2 Minute Post Liters of Oxygen  0 L       Oxygen Initial Assessment: Oxygen Initial Assessment - 12/21/17 1448      Home Oxygen   Home Oxygen Device  None    Sleep Oxygen Prescription  CPAP    Liters per minute  0    Home Exercise Oxygen Prescription  None    Home at Rest Exercise Oxygen Prescription  None    Compliance with Home Oxygen Use  No    Comments  -- he has not been wearing      Initial 6 min Walk   Oxygen Used  None      Program Oxygen Prescription   Program Oxygen Prescription  None      Intervention   Short Term Goals  To learn and demonstrate proper use of respiratory medications;To learn and demonstrate proper pursed lip breathing techniques or other breathing techniques.;To learn and understand importance of monitoring SPO2 with pulse oximeter and demonstrate accurate use of the pulse oximeter.;To learn and understand importance  of maintaining oxygen saturations>88%    Long  Term Goals  Verbalizes importance of monitoring SPO2 with pulse oximeter and return demonstration;Maintenance of O2 saturations>88%;Exhibits proper breathing techniques, such as pursed lip breathing or other method taught during program session;Compliance with respiratory medication;Demonstrates proper use of MDI's       Oxygen Re-Evaluation: Oxygen Re-Evaluation    Row Name 12/24/17 1137 01/17/18 1716 02/14/18 1208         Program Oxygen Prescription   Program Oxygen Prescription  None  None  None       Home Oxygen   Home Oxygen Device  None  None  None     Sleep Oxygen Prescription  CPAP  CPAP  CPAP     Liters per minute  0  0  0     Home Exercise Oxygen Prescription  None  None  None     Home at Rest Exercise Oxygen Prescription  None  None  None     Compliance with Home Oxygen Use  No  No  No        Goals/Expected Outcomes   Short Term Goals  To learn and demonstrate proper use of respiratory medications;To learn and demonstrate proper pursed lip breathing techniques or other breathing techniques.;To learn and understand importance of monitoring SPO2 with pulse oximeter and demonstrate accurate use of the pulse oximeter.;To learn and understand importance of maintaining oxygen saturations>88%  To learn and demonstrate proper use of respiratory medications;To learn and demonstrate proper pursed lip breathing techniques or other breathing techniques.;To learn and understand importance of monitoring SPO2 with pulse oximeter and demonstrate accurate use of the pulse oximeter.;To learn and understand importance of maintaining oxygen saturations>88%  To learn and demonstrate proper use of respiratory medications;To learn and demonstrate proper pursed lip breathing techniques or other breathing techniques.;To learn and understand importance of monitoring SPO2 with pulse oximeter and demonstrate accurate use of the pulse oximeter.;To learn and understand importance of maintaining oxygen saturations>88%     Long  Term Goals  Verbalizes importance of monitoring SPO2 with pulse oximeter and return demonstration;Maintenance of O2 saturations>88%;Exhibits proper breathing techniques, such as pursed lip breathing or other method taught during program session;Compliance with respiratory medication;Demonstrates proper use of MDI's  Verbalizes importance of monitoring SPO2 with pulse oximeter and return demonstration;Maintenance of O2 saturations>88%;Exhibits proper breathing techniques, such as pursed lip breathing or other method taught during program session;Compliance with respiratory medication;Demonstrates proper use of MDI's  Verbalizes importance of monitoring SPO2 with pulse oximeter and return demonstration;Maintenance of O2 saturations>88%;Exhibits proper breathing techniques, such as pursed lip breathing or  other method taught during program session;Compliance with respiratory medication;Demonstrates proper use of MDI's     Comments  Reviewed PLB technique with pt.  Talked about how it work and it's important to maintaining his exercise saturations.    Samuel Moore stated that when he went to the hospital the CPAP pressure that they used was too much and he coudl not tolerate it. He says his machine is not set that high and has been trying to wear it more.  Samuel Moore is wearing his CPAP every other night for about 4-5 hours. He can tell a difference when he doesnt wear it. He wants to move up to every night but taking it a little bit at a time.      Goals/Expected Outcomes  Short: Become more profiecient at using PLB.   Long: Become independent at using PLB.  Short: wear CPAP more often. Long: Wear CPAP  everynight for sleep  Short: continue wearing CPAP longer each night Long: Wear CPAP every night.         Oxygen Discharge (Final Oxygen Re-Evaluation): Oxygen Re-Evaluation - 02/14/18 1208      Program Oxygen Prescription   Program Oxygen Prescription  None      Home Oxygen   Home Oxygen Device  None    Sleep Oxygen Prescription  CPAP    Liters per minute  0    Home Exercise Oxygen Prescription  None    Home at Rest Exercise Oxygen Prescription  None    Compliance with Home Oxygen Use  No      Goals/Expected Outcomes   Short Term Goals  To learn and demonstrate proper use of respiratory medications;To learn and demonstrate proper pursed lip breathing techniques or other breathing techniques.;To learn and understand importance of monitoring SPO2 with pulse oximeter and demonstrate accurate use of the pulse oximeter.;To learn and understand importance of maintaining oxygen saturations>88%    Long  Term Goals  Verbalizes importance of monitoring SPO2 with pulse oximeter and return demonstration;Maintenance of O2 saturations>88%;Exhibits proper breathing techniques, such as pursed lip breathing or other  method taught during program session;Compliance with respiratory medication;Demonstrates proper use of MDI's    Comments  Samuel Moore is wearing his CPAP every other night for about 4-5 hours. He can tell a difference when he doesnt wear it. He wants to move up to every night but taking it a little bit at a time.     Goals/Expected Outcomes  Short: continue wearing CPAP longer each night Long: Wear CPAP every night.        Initial Exercise Prescription: Initial Exercise Prescription - 12/21/17 1500      Date of Initial Exercise RX and Referring Provider   Date  12/21/17    Referring Provider  Califano, Lucy Chris [facility]      Treadmill   MPH  2.4    Grade  0.5    Minutes  15    METs  3      NuStep   Level  4    SPM  80    Minutes  15    METs  3      Recumbant Elliptical   Level  2    RPM  50    Minutes  15    METs  3      Prescription Details   Frequency (times per week)  3    Duration  Progress to 45 minutes of aerobic exercise without signs/symptoms of physical distress      Intensity   THRR 40-80% of Max Heartrate  115-140    Ratings of Perceived Exertion  11-13    Perceived Dyspnea  0-4      Progression   Progression  Continue to progress workloads to maintain intensity without signs/symptoms of physical distress.      Resistance Training   Training Prescription  Yes    Weight  4 lbs    Reps  10-15       Perform Capillary Blood Glucose checks as needed.  Exercise Prescription Changes: Exercise Prescription Changes    Row Name 12/21/17 1500 12/29/17 0700 01/12/18 1200 01/25/18 1400 02/07/18 1600     Response to Exercise   Blood Pressure (Admit)  132/74  138/68  122/62  122/70  112/60   Blood Pressure (Exercise)  142/72  138/70  -  -  -   Blood Pressure (Exit)  136/76  126/60  114/62  112/64  116/70   Heart Rate (Admit)  90 bpm  99 bpm  101 bpm  112 bpm  97 bpm   Heart Rate (Exercise)  138 bpm  133 bpm  136 bpm  132 bpm  142 bpm   Heart Rate (Exit)  105 bpm   100 bpm  107 bpm  92 bpm  96 bpm   Oxygen Saturation (Admit)  100 %  99 %  100 %  100 %  99 %   Oxygen Saturation (Exercise)  99 %  97 %  99 %  94 %  95 %   Oxygen Saturation (Exit)  100 %  99 %  99 %  98 %  98 %   Rating of Perceived Exertion (Exercise)  11  13  11  13  13    Perceived Dyspnea (Exercise)  2  2  1  2  1    Symptoms  none  none  none  none  none   Comments  walk test results  first full day of exercise  -  -  -   Duration  -  Continue with 45 min of aerobic exercise without signs/symptoms of physical distress.  Continue with 45 min of aerobic exercise without signs/symptoms of physical distress.  Continue with 45 min of aerobic exercise without signs/symptoms of physical distress.  Continue with 45 min of aerobic exercise without signs/symptoms of physical distress.   Intensity  -  THRR unchanged  THRR unchanged  THRR unchanged  THRR unchanged     Progression   Progression  -  Continue to progress workloads to maintain intensity without signs/symptoms of physical distress.  Continue to progress workloads to maintain intensity without signs/symptoms of physical distress.  Continue to progress workloads to maintain intensity without signs/symptoms of physical distress.  Continue to progress workloads to maintain intensity without signs/symptoms of physical distress.   Average METs  -  2.2  3.17  3.32  3.31     Resistance Training   Training Prescription  -  Yes  Yes  Yes  Yes   Weight  -  4 lbs  4 lbs  4 lbs  5 lbs   Reps  -  10-15  10-15  10-15  10-15     Interval Training   Interval Training  -  No  No  No  No     Treadmill   MPH  -  2.4  2.4  2.5  2.7   Grade  -  0.5  0.5  1  1.5   Minutes  -  15  15  15  15    METs  -  3  3  3.26  3.63     NuStep   Level  -  4  4  4  4    SPM  -  59  99  100  100   Minutes  -  15  15  15  15    METs  -  2.1  4.2  4.4  4.2     Recumbant Elliptical   Level  -  2  2  2  2    RPM  -  33  65  63  61   Minutes  -  15  15  15  15    METs  -   1.5  2.3  2.3  2.1     Home Exercise Plan   Plans to continue exercise at  -  -  Home (comment) walking  Home (comment) walking  Home (comment) walking   Frequency  -  -  Add 1 additional day to program exercise sessions.  Add 1 additional day to program exercise sessions.  Add 1 additional day to program exercise sessions.   Initial Home Exercises Provided  -  -  01/12/18  01/12/18  01/12/18      Exercise Comments: Exercise Comments    Row Name 12/24/17 1136           Exercise Comments  First full day of exercise!  Patient was oriented to gym and equipment including functions, settings, policies, and procedures.  Patient's individual exercise prescription and treatment plan were reviewed.  All starting workloads were established based on the results of the 6 minute walk test done at initial orientation visit.  The plan for exercise progression was also introduced and progression will be customized based on patient's performance and goals.          Exercise Goals and Review: Exercise Goals    Row Name 12/21/17 1558             Exercise Goals   Increase Physical Activity  Yes       Intervention  Provide advice, education, support and counseling about physical activity/exercise needs.;Develop an individualized exercise prescription for aerobic and resistive training based on initial evaluation findings, risk stratification, comorbidities and participant's personal goals.       Expected Outcomes  Achievement of increased cardiorespiratory fitness and enhanced flexibility, muscular endurance and strength shown through measurements of functional capacity and personal statement of participant.       Increase Strength and Stamina  Yes       Intervention  Provide advice, education, support and counseling about physical activity/exercise needs.;Develop an individualized exercise prescription for aerobic and resistive training based on initial evaluation findings, risk stratification,  comorbidities and participant's personal goals.       Expected Outcomes  Achievement of increased cardiorespiratory fitness and enhanced flexibility, muscular endurance and strength shown through measurements of functional capacity and personal statement of participant.       Able to understand and use rate of perceived exertion (RPE) scale  Yes       Intervention  Provide education and explanation on how to use RPE scale       Expected Outcomes  Short Term: Able to use RPE daily in rehab to express subjective intensity level;Long Term:  Able to use RPE to guide intensity level when exercising independently       Able to understand and use Dyspnea scale  Yes       Intervention  Provide education and explanation on how to use Dyspnea scale       Expected Outcomes  Short Term: Able to use Dyspnea scale daily in rehab to express subjective sense of shortness of breath during exertion;Long Term: Able to use Dyspnea scale to guide intensity level when exercising independently       Knowledge and understanding of Target Heart Rate Range (THRR)  Yes       Intervention  Provide education and explanation of THRR including how the numbers were predicted and where they are located for reference       Expected Outcomes  Short Term: Able to state/look up THRR;Long Term: Able to use THRR to govern intensity when exercising independently;Short Term: Able to use daily as guideline for intensity in rehab       Able to check pulse independently  Yes       Intervention  Provide education and demonstration on how to check pulse in carotid and radial arteries.;Review the importance of being able to check your own pulse for safety during independent exercise       Expected Outcomes  Short Term: Able to explain why pulse checking is important during independent exercise;Long Term: Able to check pulse independently and accurately       Understanding of Exercise Prescription  Yes       Intervention  Provide education,  explanation, and written materials on patient's individual exercise prescription       Expected Outcomes  Short Term: Able to explain program exercise prescription;Long Term: Able to explain home exercise prescription to exercise independently          Exercise Goals Re-Evaluation : Exercise Goals Re-Evaluation    Row Name 12/24/17 1137 12/29/17 0752 01/12/18 1228 01/25/18 1433 02/07/18 1627     Exercise Goal Re-Evaluation   Exercise Goals Review  Able to understand and use Dyspnea scale;Understanding of Exercise Prescription;Knowledge and understanding of Target Heart Rate Range (THRR);Able to understand and use rate of perceived exertion (RPE) scale  Increase Physical Activity;Increase Strength and Stamina;Understanding of Exercise Prescription  Increase Physical Activity;Able to understand and use Dyspnea scale;Understanding of Exercise Prescription;Increase Strength and Stamina;Knowledge and understanding of Target Heart Rate Range (THRR);Able to understand and use rate of perceived exertion (RPE) scale;Able to check pulse independently  Increase Physical Activity;Understanding of Exercise Prescription;Increase Strength and Stamina  Increase Physical Activity;Understanding of Exercise Prescription;Increase Strength and Stamina   Comments  Reviewed RPE scale, THR and program prescription with pt today.  Pt voiced understanding and was given a copy of goals to take home.   Samuel Moore has completed his first full day of exercise.  He was able to do the full 45 min with minimal rest between stations.  We will continue to monitor his progression.   Samuel Moore is off to a good start in rehab.  He has already noticed that his strength and stamina are starting to get better.  He is enjoying coming to exercise.  Currently, he is not doing home exericse, but has been able to increase his activity levels at home.  Reviewed home exercise with pt today.  Pt plans to walk at home for exercise.  Reviewed THR, pulse, RPE, sign  and symptoms, and when to call 911 or MD.  Also discussed weather considerations and indoor options.  Pt voiced understanding.  Samuel Moore has been doing well in rehab.  He has worked his way up to 4.4 METs on the NuStep.  We will work on increasing workloads especially on recumbent elliptical.  We will continue to monitor his progression.   Samuel Moore continues to do well in rehab. He is now up to 2.7 mph on the treadmill.  We will continue to monitor his progress.    Expected Outcomes  Short: Use RPE daily to regulate intensity.  Long: Follow program prescription in THR.  Short: Continue to attend [facility] regularly.  Long: Continue to follow program prescription.   Short: Add in at least one extra day a week at home for exercise.  Long: Continue to increase physcial activity.   Short: Increase workload on REL and speed on treadmill. Long: Continue to exercise more at home.   Short: Increase workload on REL.  Long: Continue to exercise independently      Discharge Exercise Prescription (Final Exercise Prescription Changes): Exercise Prescription Changes - 02/07/18 1600  Response to Exercise   Blood Pressure (Admit)  112/60    Blood Pressure (Exit)  116/70    Heart Rate (Admit)  97 bpm    Heart Rate (Exercise)  142 bpm    Heart Rate (Exit)  96 bpm    Oxygen Saturation (Admit)  99 %    Oxygen Saturation (Exercise)  95 %    Oxygen Saturation (Exit)  98 %    Rating of Perceived Exertion (Exercise)  13    Perceived Dyspnea (Exercise)  1    Symptoms  none    Duration  Continue with 45 min of aerobic exercise without signs/symptoms of physical distress.    Intensity  THRR unchanged      Progression   Progression  Continue to progress workloads to maintain intensity without signs/symptoms of physical distress.    Average METs  3.31      Resistance Training   Training Prescription  Yes    Weight  5 lbs    Reps  10-15      Interval Training   Interval Training  No      Treadmill   MPH  2.7     Grade  1.5    Minutes  15    METs  3.63      NuStep   Level  4    SPM  100    Minutes  15    METs  4.2      Recumbant Elliptical   Level  2    RPM  61    Minutes  15    METs  2.1      Home Exercise Plan   Plans to continue exercise at  Home (comment) walking    Frequency  Add 1 additional day to program exercise sessions.    Initial Home Exercises Provided  01/12/18       Nutrition:  Target Goals: Understanding of nutrition guidelines, daily intake of sodium <1543m, cholesterol <2040m calories 30% from fat and 7% or less from saturated fats, daily to have 5 or more servings of fruits and vegetables.  Biometrics: Pre Biometrics - 12/21/17 1600      Pre Biometrics   Height  6' 1.2" (1.859 m)    Weight  180 lb 12.8 oz (82 kg)    Waist Circumference  39.5 inches    Hip Circumference  39 inches    Waist to Hip Ratio  1.01 %    BMI (Calculated)  23.73        Nutrition Therapy Plan and Nutrition Goals: Nutrition Therapy & Goals - 01/12/18 1511      Personal Nutrition Goals   Nutrition Goal  Plan to eat a vegetable and/or a fruit with each meal. If canned, choose low-sodium vegetables and low-sugar fruits    Personal Goal #2  Switch to using 2% milk instead of whole milk.    Personal Goal #3  Decrease sugar from drinks -- this might help with appetite for solid foods. Try diet Dr. PeMalachi Bondsand can try decreasing sugar used in tea gradually    Comments  Samuel Moore improving appetite, but not up to normal yet. Discussed some strategies to help appetite as well as breathing. Encouraged him to eat foods that taste good to him, but make some healthy changes as able.        Nutrition Assessments:   Nutrition Goals Re-Evaluation: Nutrition Goals Re-Evaluation    Row Name 01/17/18 1718 02/14/18 1205  Goals   Current Weight  180 lb (81.6 kg)  181 lb (82.1 kg)      Nutrition Goal  Eat less sugar and eat more vegetables.  Eat more vegetables and less  sugary foods. Be mindful of cholestrol foods      Comment  Samuel Moore has met with the dietician and has gave him some good insight on the things he should be eating.  Samuel Moore still has the suggestions from the dietician, it is hard for him to make the changes. He has cut down on his Dr. Malachi Bonds consumption and eats less hot dogs.       Expected Outcome  Short: decrease sugar intake from drinks. Long: try to drink diet sodas or maintain cutting out sugary drinks.  Short. decrease sugar intake and high cholesterol foods. Long: apply all changes recommended by dietician.          Nutrition Goals Discharge (Final Nutrition Goals Re-Evaluation): Nutrition Goals Re-Evaluation - 02/14/18 1205      Goals   Current Weight  181 lb (82.1 kg)    Nutrition Goal  Eat more vegetables and less sugary foods. Be mindful of cholestrol foods    Comment  Samuel Moore still has the suggestions from the dietician, it is hard for him to make the changes. He has cut down on his Dr. Malachi Bonds consumption and eats less hot dogs.     Expected Outcome  Short. decrease sugar intake and high cholesterol foods. Long: apply all changes recommended by dietician.        Psychosocial: Target Goals: Acknowledge presence or absence of significant depression and/or stress, maximize coping skills, provide positive support system. Participant is able to verbalize types and ability to use techniques and skills needed for reducing stress and depression.   Initial Review & Psychosocial Screening: Initial Psych Review & Screening - 12/21/17 1437      Initial Review   Current issues with  Current Depression;Current Stress Concerns;Current Sleep Concerns    Source of Stress Concerns  Financial;Chronic Illness;Unable to perform yard/household activities    Comments  VA disability check has been cut from 100% to 60 %. He is really tired and sometimes he cannot get get out of bed. His COPD does not get to him unless it flares up.      Family Dynamics     Good Support System?  Yes    Comments  His daughter and his wife are good support system.      Barriers   Psychosocial barriers to participate in program  The patient should benefit from training in stress management and relaxation.      Screening Interventions   Interventions  Yes;Program counselor consult;Provide feedback about the scores to participant;Encouraged to exercise;To provide support and resources with identified psychosocial needs    Expected Outcomes  Short Term goal: Utilizing psychosocial counselor, staff and physician to assist with identification of specific Stressors or current issues interfering with healing process. Setting desired goal for each stressor or current issue identified.;Long Term Goal: Stressors or current issues are controlled or eliminated.;Long Term goal: The participant improves quality of Life and PHQ9 Scores as seen by post scores and/or verbalization of changes;Short Term goal: Identification and review with participant of any Quality of Life or Depression concerns found by scoring the questionnaire.       Quality of Life Scores:  Scores of 19 and below usually indicate a poorer quality of life in these areas.  A difference of  2-3 points  is a clinically meaningful difference.  A difference of 2-3 points in the total score of the Quality of Life Index has been associated with significant improvement in overall quality of life, self-image, physical symptoms, and general health in studies assessing change in quality of life.  PHQ-9: Recent Review Flowsheet Data    Depression screen [facility] 2/9 01/31/2018 12/21/2017   Decreased Interest 1 2   Down, Depressed, Hopeless 1 1   PHQ - 2 Score 2 3   Altered sleeping 2 3   Tired, decreased energy 2 2   Change in appetite 1 0   Feeling bad or failure about yourself  2 1   Trouble concentrating 1 2   Moving slowly or fidgety/restless 0 0   Suicidal thoughts 1  0   PHQ-9 Score 11 11   Difficult doing work/chores  Somewhat difficult Somewhat difficult     Interpretation of Total Score  Total Score Depression Severity:  1-4 = Minimal depression, 5-9 = Mild depression, 10-14 = Moderate depression, 15-19 = Moderately severe depression, 20-27 = Severe depression   Psychosocial Evaluation and Intervention: Psychosocial Evaluation - 01/24/18 1219      Psychosocial Evaluation & Interventions   Interventions  Encouraged to exercise with the program and follow exercise prescription;Stress management education;Relaxation education    Comments  Counselor met with Mr. Villamizar Motta) today for initial psychosocial evaluation.  He is a 67 year old who has been diagnosed with COPD.  Jermanie has a great support system as a newlywed of 2 months.  He has multiple health issues having battled lung cancer several years ago and bladder cancer just recently.  He has chronic sleep problems with only 4-5 hours/night typically and has a goal to get at least 6.  He has a good appetite and is generally in a positive mood.  Macyn denies a history of depression or anxiety or any current symptoms.  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  He has goals to breathe better and increase his energy.  Staff will follow with Samuel Moore throughout the course of this program.      Expected Outcomes  Samuel Moore will benefit from consistent exercise to achieve his stated goals.  The educational and psychoeducational components of this program will be helpful in understanding and coping more positively.      Continue Psychosocial Services   Follow up required by staff       Psychosocial Re-Evaluation: Psychosocial Re-Evaluation    [place] Name 01/17/18 1721 01/31/18 1228           Psychosocial Re-Evaluation   Current issues with  Current Stress Concerns;Current Depression;Current Sleep Concerns  Current Stress Concerns;Current Depression;Current Sleep Concerns      Comments  Samuel Moore  states that his stress levels have not changed too much since the start of the program. He is coming to the program regularly and has been improving. He always has a good attitude and even jokes a little in class.  Samuel Moore is having a hard time with his VA check. He states they are trying to go from 60 percent to 0 percent. He relies on that income to live and he is trying to fight for his check. He states that his bladder cancer diagnosis evidently is not a valid diagnosis for his check. He is dong all he can at the  moment and will know what the outcome will be in  about 60 days.      Expected Outcomes  Short: attend [facility] to reduce stress. Long: maintain an exercise routine to decrease or eliminate stress.  Short: fight for his VA check. Long: maintain a positive outlook no matter the outcome of his VA check.      Interventions  Encouraged to attend [facility] Rehabilitation for the exercise  Encouraged to attend Pulmonary Rehabilitation for the exercise      Continue Psychosocial Services   Follow up required by staff  Follow up required by staff         Psychosocial Discharge (Final Psychosocial Re-Evaluation): Psychosocial Re-Evaluation - 01/31/18 1228      Psychosocial Re-Evaluation   Current issues with  Current Stress Concerns;Current Depression;Current Sleep Concerns    Comments  Samuel Moore is having a hard time with his VA check. He states they are trying to go from 60 percent to 0 percent. He relies on that income to live and he is trying to fight for his check. He states that his bladder cancer diagnosis evidently is not a valid diagnosis for his check. He is dong all he can at the  moment and will know what the outcome will be in about 60 days.    Expected Outcomes  Short: fight for his VA check. Long: maintain a positive outlook no matter the outcome of his VA check.    Interventions  Encouraged to attend Pulmonary Rehabilitation for the exercise    Continue Psychosocial Services   Follow  up required by staff       Education: Education Goals: Education classes will be provided on a weekly basis, covering required topics. Participant will state understanding/return demonstration of topics presented.  Learning Barriers/Preferences: Learning Barriers/Preferences - 12/21/17 1444      Learning Barriers/Preferences   Learning Barriers  Sight glasses    Learning Preferences  None       Education Topics:  Initial Evaluation Education: - Verbal, written and demonstration of respiratory meds, oximetry and breathing techniques. Instruction on use of nebulizers and MDIs and importance of monitoring MDI activations.   Pulmonary Rehab from 02/16/2018 in [facility] Cardiac and Pulmonary Rehab  Date  12/21/17  Educator  [facility]  Instruction Review Code  1- Verbalizes Understanding      General Nutrition Guidelines/Fats and Fiber: -Group instruction provided by verbal, written material, models and posters to present the general guidelines for heart healthy nutrition. Gives an explanation and review of dietary fats and fiber.   Pulmonary Rehab from 02/16/2018 in [facility] Cardiac and Pulmonary Rehab  Date  01/10/18  Educator  [facility]  Instruction Review Code  1- Verbalizes Understanding      Controlling Sodium/Reading Food Labels: -Group verbal and written material supporting the discussion of sodium use in heart healthy nutrition. Review and explanation with models, verbal and written materials for utilization of the food label.   Pulmonary Rehab from 02/16/2018 in [facility] Cardiac and Pulmonary Rehab  Date  01/17/18  Educator  [facility]  Instruction Review Code  1- Verbalizes Understanding      Exercise Physiology & General Exercise Guidelines: - Group verbal and written instruction with models to review the exercise physiology of the cardiovascular system and associated critical values. Provides general exercise guidelines with specific guidelines to those with heart or lung disease.    Aerobic Exercise  & Resistance Training: - Gives group verbal and written instruction on the various components of exercise. Focuses on aerobic and resistive training programs and the benefits of this  training and how to safely progress through these programs.   Pulmonary Rehab from 02/16/2018 in [facility] Cardiac and Pulmonary Rehab  Date  12/31/17  Educator  [facility]  Instruction Review Code  1- Verbalizes Understanding      Flexibility, Balance, Mind/Body Relaxation: Provides group verbal/written instruction on the benefits of flexibility and balance training, including mind/body exercise modes such as yoga, pilates and tai chi.  Demonstration and skill practice provided.   Pulmonary Rehab from 02/16/2018 in [facility] Cardiac and Pulmonary Rehab  Date  01/26/18  Educator  [facility]  Instruction Review Code  1- Verbalizes Understanding      Stress and Anxiety: - Provides group verbal and written instruction about the health risks of elevated stress and causes of high stress.  Discuss the correlation between heart/lung disease and anxiety and treatment options. Review healthy ways to manage with stress and anxiety.   Pulmonary Rehab from 02/16/2018 in [facility] Cardiac and Pulmonary Rehab  Date  02/16/18  Educator  [facility]  Instruction Review Code  1- Verbalizes Understanding      Depression: - Provides group verbal and written instruction on the correlation between heart/lung disease and depressed mood, treatment options, and the stigmas associated with seeking treatment.   Pulmonary Rehab from 02/16/2018 in [facility] Cardiac and Pulmonary Rehab  Date  02/02/18  Educator  [facility]  Instruction Review Code  1- Verbalizes Understanding      Exercise & Equipment Safety: - Individual verbal instruction and demonstration of equipment use and safety with use of the equipment.   Pulmonary Rehab from 02/16/2018 in [facility] Cardiac and Pulmonary Rehab  Date  12/21/17  Educator  [facility]  Instruction Review Code  1- Verbalizes Understanding      Infection  Prevention: - Provides verbal and written material to individual with discussion of infection control including proper hand washing and proper equipment cleaning during exercise session.   Pulmonary Rehab from 02/16/2018 in [facility] Cardiac and Pulmonary Rehab  Date  12/21/17  Educator  [facility]  Instruction Review Code  1- Verbalizes Understanding      Falls Prevention: - Provides verbal and written material to individual with discussion of falls prevention and safety.   Pulmonary Rehab from 02/16/2018 in [facility] Cardiac and Pulmonary Rehab  Date  12/21/17  Educator  [facility]  Instruction Review Code  1- Verbalizes Understanding      Diabetes: - Individual verbal and written instruction to review signs/symptoms of diabetes, desired ranges of glucose level fasting, after meals and with exercise. Advice that pre and post exercise glucose checks will be done for 3 sessions at entry of program.   Chronic Lung Diseases: - Group verbal and written instruction to review updates, respiratory medications, advancements in procedures and treatments. Discuss use of supplemental oxygen including available portable oxygen systems, continuous and intermittent flow rates, concentrators, personal use and safety guidelines. Review proper use of inhaler and spacers. Provide informative websites for self-education.    Energy Conservation: - Provide group verbal and written instruction for methods to conserve energy, plan and organize activities. Instruct on pacing techniques, use of adaptive equipment and posture/positioning to relieve shortness of breath.   Pulmonary Rehab from 02/16/2018 in [facility] Cardiac and Pulmonary Rehab  Date  02/09/18  Educator  [facility]  Instruction Review Code  1- Verbalizes Understanding      Triggers and Exacerbations: - Group verbal and written instruction to review types of environmental triggers and ways to prevent exacerbations. Discuss weather changes, air quality and the benefits  of nasal washing.  Review warning signs and symptoms to help prevent infections. Discuss techniques for effective airway clearance, coughing, and vibrations.   AED/CPR: - Group verbal and written instruction with the use of models to demonstrate the basic use of the AED with the basic ABC's of resuscitation.   Pulmonary Rehab from 02/16/2018 in [facility] Cardiac and Pulmonary Rehab  Date  02/11/18  Educator  [facility]  Instruction Review Code  1- Actuary and Physiology of the Lungs: - Group verbal and written instruction with the use of models to provide basic lung anatomy and physiology related to function, structure and complications of lung disease.   Anatomy & Physiology of the Heart: - Group verbal and written instruction and models provide basic cardiac anatomy and physiology, with the coronary electrical and arterial systems. Review of Valvular disease and Heart Failure   Pulmonary Rehab from 02/16/2018 in [facility] Cardiac and Pulmonary Rehab  Date  01/12/18  Educator  [facility]  Instruction Review Code  1- Verbalizes Understanding      Cardiac Medications: - Group verbal and written instruction to review commonly prescribed medications for heart disease. Reviews the medication, class of the drug, and side effects.   Pulmonary Rehab from 02/16/2018 in [facility] Cardiac and Pulmonary Rehab  Date  01/28/18  Educator  [facility]  Instruction Review Code  1- Verbalizes Understanding      Know Your Numbers and Risk Factors: -Group verbal and written instruction about important numbers in your health.  Discussion of what are risk factors and how they play a role in the disease process.  Review of Cholesterol, Blood Pressure, Diabetes, and BMI and the role they play in your overall health.   Sleep Hygiene: -Provides group verbal and written instruction about how sleep can affect your health.  Define sleep hygiene, discuss sleep cycles and impact of sleep habits. Review good sleep hygiene tips.     Other: -Provides group and verbal instruction on various topics (see comments)   Pulmonary Rehab from 02/16/2018 in [facility] Cardiac and Pulmonary Rehab  Date  01/05/18  Educator  [facility]  Instruction Review Code  1- Verbalizes Understanding [SLEEP]       Knowledge Questionnaire Score: Knowledge Questionnaire Score - 12/21/17 1444      Knowledge Questionnaire Score   Pre Score  15/18 reviewed with patient        Core Components/Risk Factors/Patient Goals at Admission: Personal Goals and Risk Factors at Admission - 12/21/17 1449      Core Components/Risk Factors/Patient Goals on Admission    Weight Management  Yes;Weight Maintenance    Intervention  Weight Management: Develop a combined nutrition and exercise program designed to reach desired caloric intake, while maintaining appropriate intake of nutrient and fiber, sodium and fats, and appropriate energy expenditure required for the weight goal.;Weight Management: Provide education and appropriate resources to help participant work on and attain dietary goals.;Weight Management/Obesity: Establish reasonable short term and long term weight goals.    Admit Weight  180 lb 12.8 oz (82 kg)    Goal Weight: Short Term  175 lb (79.4 kg)    Goal Weight: Long Term  180 lb (81.6 kg)    Expected Outcomes  Short Term: Continue to assess and modify interventions until short term weight is achieved;Long Term: Adherence to nutrition and physical activity/exercise program aimed toward attainment of established weight goal;Weight Maintenance: Understanding of the daily nutrition guidelines, which includes 25-35% calories from fat, 7% or less  cal from saturated fats, less than 227m cholesterol, less than 1.5gm of sodium, & 5 or more servings of fruits and vegetables daily;Understanding recommendations for meals to include 15-35% energy as protein, 25-35% energy from fat, 35-60% energy from carbohydrates, less than 2074mof dietary cholesterol, 20-35 gm of total  fiber daily;Understanding of distribution of calorie intake throughout the day with the consumption of 4-5 meals/snacks    Improve shortness of breath with ADL's  Yes    Intervention  Provide education, individualized exercise plan and daily activity instruction to help decrease symptoms of SOB with activities of daily living.    Expected Outcomes  Short Term: Achieves a reduction of symptoms when performing activities of daily living.    Lipids  Yes    Intervention  Provide education and support for participant on nutrition & aerobic/resistive exercise along with prescribed medications to achieve LDL <7058mHDL >96m53m  Expected Outcomes  Short Term: Participant states understanding of desired cholesterol values and is compliant with medications prescribed. Participant is following exercise prescription and nutrition guidelines.;Long Term: Cholesterol controlled with medications as prescribed, with individualized exercise RX and with personalized nutrition plan. Value goals: LDL < 70mg36mL > 40 mg.       Core Components/Risk Factors/Patient Goals Review:  Goals and Risk Factor Review    Row Name 01/17/18 1714 02/14/18 1201           Core Components/Risk Factors/Patient Goals Review   Personal Goals Review  Weight Management/Obesity;Improve shortness of breath with ADL's;Lipids  Weight Management/Obesity;Improve shortness of breath with ADL's;Lipids      Review  GarveAndrickbeen doing great in [facility] improving his levels on all exercises. His shortness of breath has got a little better doing things around the house. His blood pressure has been within normal limits.  GarveMelvinbeen continually improving with exercise. He continues to increase is levels on all of his equipment. He reports a one pound gain in the last month, but he is happy with his current weight. He has noticed a change in his breathing throughout the program. He is taking his cholesterol medicine and attempting to  watch what he is eating      Expected Outcomes  Short: attend [facility] regularly. Long: maintain exercise after [facility].  Short: continue to attend [facility] and challenge himself with exercise. Long: apply lifestyle changes after graduation, apply lipid control changes.          Core Components/Risk Factors/Patient Goals at Discharge (Final Review):  Goals and Risk Factor Review - 02/14/18 1201      Core Components/Risk Factors/Patient Goals Review   Personal Goals Review  Weight Management/Obesity;Improve shortness of breath with ADL's;Lipids    Review  GarveYeebeen continually improving with exercise. He continues to increase is levels on all of his equipment. He reports a one pound gain in the last month, but he is happy with his current weight. He has noticed a change in his breathing throughout the program. He is taking his cholesterol medicine and attempting to watch what he is eating    Expected Outcomes  Short: continue to attend [facility] and challenge himself with exercise. Long: apply lifestyle changes after graduation, apply lipid control changes.        ITP Comments: ITP Comments    Row Name 12/21/17 1420 12/27/17 0836 12/31/17 1246 01/24/18 0937 01/24/18 1226   ITP Comments  Medical Evaluation completed. Chart sent for review and changes to Dr. Mark Emily Filbert  Director of [place]. Diagnosis can be found in [facility] encounter 12/21/17  30 day review completed. ITP sent to Dr. Emily Filbert Director of [place]. Continue with ITP unless changes are made by physician.  Craig has informed us that he has a small aortic aneurysm that the doctors are going to keep an eye on. He states that he is not restricted from [place].  30 day review completed. ITP sent to Dr. Emily Filbert Director of [place]. Continue with ITP unless changes are made by physician.  Kavin states that his doctor checked his abdominal aneurysm and no changes have occured and it is ok to continue with exercise.   [place]  Name 02/21/18 0839           ITP Comments  30 day review completed. ITP sent to Dr. Emily Filbert Director of [place]. Continue with ITP unless changes are made by physician.          Comments: 30 day review

## 2018-02-28 DIAGNOSIS — J449 Chronic obstructive pulmonary disease, unspecified: Secondary | ICD-10-CM | POA: Diagnosis not present

## 2018-02-28 NOTE — Progress Notes (Signed)
Daily Session Note  Patient Details  Name: Samuel Moore MRN: [id number] Date of Birth: 07/02/1951 Referring Provider:     Pulmonary Rehab from 12/21/2017 in [facility] Cardiac and Pulmonary Rehab  Referring Provider  Elenore Rota MD [[facility]]      Encounter Date: 02/28/2018  Check In: Session Check In - 02/28/18 1126      Check-In   Location  [facility]-Cardiac & Pulmonary Rehab    Staff Present  Earlean Shawl, BS, ACSM CEP, Exercise Physiologist;Amanda Oletta Darter, BA, ACSM CEP, Exercise Physiologist    Supervising physician immediately available to respond to emergencies  [facility] immediately available ER MD    Physician(s)   Dr. Burlene Arnt and Corky Downs    Medication changes reported      No    Fall or balance concerns reported     No    Tobacco Cessation  No Change    Warm-up and Cool-down  Performed as group-led instruction    Resistance Training Performed  Yes    VAD Patient?  No      Pain Assessment   Currently in Pain?  No/denies          Social History   Tobacco Use  Smoking Status Former Smoker  . Packs/day: 2.00  . Years: 10.00  . Pack years: 20.00  . Types: Cigarettes  . Last attempt to quit: 12/14/2002  . Years since quitting: 15.2  Smokeless Tobacco Never Used    Goals Met:  Independence with exercise equipment Exercise tolerated well No report of cardiac concerns or symptoms Strength training completed today  Goals Unmet:  Not Applicable  Comments: Pt able to follow exercise prescription today without complaint.  Will continue to monitor for progression.   Dr. Emily Filbert is Medical Director for [place] and [facility].

## 2018-03-02 DIAGNOSIS — J449 Chronic obstructive pulmonary disease, unspecified: Secondary | ICD-10-CM

## 2018-03-02 NOTE — Progress Notes (Signed)
Daily Session Note  Patient Details  Name: Samuel Moore MRN: [id number] Date of Birth: 1951-10-11 Referring Provider:     Pulmonary Rehab from 12/21/2017 in [facility] Cardiac and Pulmonary Rehab  Referring Provider  Elenore Rota MD [[place]]      Encounter Date: 03/02/2018  Check In: Session Check In - 03/02/18 1116      Check-In   Location  [facility]-Cardiac & Pulmonary Rehab    Staff Present  Renita Papa, RN BSN;Jessica Luan Pulling, [place], RCEP, CCRP, Exercise Physiologist;Miya Luviano Flavia Shipper    Supervising physician immediately available to respond to emergencies  [facility] immediately available ER MD    Physician(s)  Dr. Joni Fears and Jimmye Norman    Medication changes reported      No    Fall or balance concerns reported     No    Tobacco Cessation  No Change    Warm-up and Cool-down  Performed as group-led instruction    Resistance Training Performed  Yes    VAD Patient?  No      Pain Assessment   Currently in Pain?  No/denies          Social History   Tobacco Use  Smoking Status Former Smoker  . Packs/day: 2.00  . Years: 10.00  . Pack years: 20.00  . Types: Cigarettes  . Last attempt to quit: 12/14/2002  . Years since quitting: 15.2  Smokeless Tobacco Never Used    Goals Met:  Independence with exercise equipment Exercise tolerated well No report of cardiac concerns or symptoms Strength training completed today  Goals Unmet:  Not Applicable  Comments: Pt able to follow exercise prescription today without complaint.  Will continue to monitor for progression.   Dr. Emily Filbert is Medical Director for [place] and [facility].

## 2018-03-04 ENCOUNTER — Encounter: Payer: Non-veteran care | Admitting: *Deleted

## 2018-03-04 DIAGNOSIS — J449 Chronic obstructive pulmonary disease, unspecified: Secondary | ICD-10-CM | POA: Diagnosis not present

## 2018-03-04 NOTE — Progress Notes (Signed)
Daily Session Note  Patient Details  Name: Samuel Moore MRN: [id number] Date of Birth: May 06, 1951 Referring Provider:     Pulmonary Rehab from 12/21/2017 in [facility] Cardiac and Pulmonary Rehab  Referring Provider  Elenore Rota MD [[place]]      Encounter Date: 03/04/2018  Check In: Session Check In - 03/04/18 1126      Check-In   Location  [facility]-Cardiac & Pulmonary Rehab    Staff Present  Alberteen Sam, MA, RCEP, CCRP, Exercise Physiologist;Meredith Sherryll Burger, RN BSN;Joseph Flavia Shipper    Supervising physician immediately available to respond to emergencies  [facility] immediately available ER MD    Physician(s)  Drs. Joni Fears and [place]    Medication changes reported      No    Fall or balance concerns reported     No    Warm-up and Cool-down  Performed as group-led Higher education careers adviser Performed  Yes    VAD Patient?  No      Pain Assessment   Currently in Pain?  No/denies          Social History   Tobacco Use  Smoking Status Former Smoker  . Packs/day: 2.00  . Years: 10.00  . Pack years: 20.00  . Types: Cigarettes  . Last attempt to quit: 12/14/2002  . Years since quitting: 15.2  Smokeless Tobacco Never Used    Goals Met:  Proper associated with RPD/PD & O2 Sat Independence with exercise equipment Using PLB without cueing & demonstrates good technique Exercise tolerated well No report of cardiac concerns or symptoms Strength training completed today  Goals Unmet:  Not Applicable  Comments: Pt able to follow exercise prescription today without complaint.  Will continue to monitor for progression.    Dr. Emily Filbert is Medical Director for [place] and [facility].

## 2018-03-09 ENCOUNTER — Encounter: Payer: Non-veteran care | Admitting: *Deleted

## 2018-03-09 DIAGNOSIS — J449 Chronic obstructive pulmonary disease, unspecified: Secondary | ICD-10-CM

## 2018-03-09 NOTE — Progress Notes (Signed)
Daily Session Note  Patient Details  Name: Samuel Moore MRN: [id number] Date of Birth: 1951/12/04 Referring Provider:     Pulmonary Rehab from 12/21/2017 in [facility] Cardiac and Pulmonary Rehab  Referring Provider  Elenore Rota MD [[place]]      Encounter Date: 03/09/2018  Check In: Session Check In - 03/09/18 1128      Check-In   Location  [facility]-Cardiac & Pulmonary Rehab    Staff Present  Renita Papa, RN BSN;Jessica Luan Pulling, MA, RCEP, CCRP, Exercise Physiologist;Joseph Flavia Shipper    Supervising physician immediately available to respond to emergencies  [facility] immediately available ER MD    Physician(s)  Dr. Mariea Clonts and Jimmye Norman     Medication changes reported      No    Fall or balance concerns reported     No    Warm-up and Cool-down  Performed as group-led instruction    Resistance Training Performed  Yes    VAD Patient?  No      Pain Assessment   Currently in Pain?  No/denies          Social History   Tobacco Use  Smoking Status Former Smoker  . Packs/day: 2.00  . Years: 10.00  . Pack years: 20.00  . Types: Cigarettes  . Last attempt to quit: 12/14/2002  . Years since quitting: 15.2  Smokeless Tobacco Never Used    Goals Met:  Proper associated with RPD/PD & O2 Sat Independence with exercise equipment Using PLB without cueing & demonstrates good technique Exercise tolerated well Strength training completed today  Goals Unmet:  Not Applicable  Comments: Pt able to follow exercise prescription today without complaint.  Will continue to monitor for progression.    Dr. Emily Filbert is Medical Director for [place] and [facility].

## 2018-03-11 ENCOUNTER — Encounter: Payer: Non-veteran care | Admitting: *Deleted

## 2018-03-11 DIAGNOSIS — J449 Chronic obstructive pulmonary disease, unspecified: Secondary | ICD-10-CM | POA: Diagnosis not present

## 2018-03-11 NOTE — Progress Notes (Signed)
Daily Session Note  Patient Details  Name: Samuel Moore MRN: [id number] Date of Birth: 22-Oct-1951 Referring Provider:     Pulmonary Rehab from 12/21/2017 in [facility] Cardiac and Pulmonary Rehab  Referring Provider  Elenore Rota MD [[facility]]      Encounter Date: 03/11/2018  Check In: Session Check In - 03/11/18 1136      Check-In   Location  [facility]-Cardiac & Pulmonary Rehab    Staff Present  Nyoka Cowden, RN, BSN, MA;Meredith Sherryll Burger, RN BSN;Jessica Luan Pulling, MA, RCEP, CCRP, Exercise Physiologist    Supervising physician immediately available to respond to emergencies  [facility] immediately available ER MD    Physician(s)  Dr. Reita Cliche and Jacqualine Code    Medication changes reported      No    Fall or balance concerns reported     No    Tobacco Cessation  No Change    Warm-up and Cool-down  Performed as group-led instruction    Resistance Training Performed  Yes    VAD Patient?  No      Pain Assessment   Currently in Pain?  No/denies          Social History   Tobacco Use  Smoking Status Former Smoker  . Packs/day: 2.00  . Years: 10.00  . Pack years: 20.00  . Types: Cigarettes  . Last attempt to quit: 12/14/2002  . Years since quitting: 15.2  Smokeless Tobacco Never Used    Goals Met:  Proper associated with RPD/PD & O2 Sat Independence with exercise equipment Using PLB without cueing & demonstrates good technique Exercise tolerated well Strength training completed today  Goals Unmet:  Not Applicable  Comments: Pt able to follow exercise prescription today without complaint.  Will continue to monitor for progression.    Dr. Emily Filbert is Medical Director for [place] and [facility].

## 2018-03-14 ENCOUNTER — Encounter: Payer: Non-veteran care | Attending: Internal Medicine

## 2018-03-14 DIAGNOSIS — J449 Chronic obstructive pulmonary disease, unspecified: Secondary | ICD-10-CM | POA: Diagnosis not present

## 2018-03-14 DIAGNOSIS — Z87891 Personal history of nicotine dependence: Secondary | ICD-10-CM | POA: Insufficient documentation

## 2018-03-14 DIAGNOSIS — Z8701 Personal history of pneumonia (recurrent): Secondary | ICD-10-CM | POA: Diagnosis not present

## 2018-03-14 DIAGNOSIS — C679 Malignant neoplasm of bladder, unspecified: Secondary | ICD-10-CM | POA: Insufficient documentation

## 2018-03-14 NOTE — Progress Notes (Signed)
Daily Session Note  Patient Details  Name: Samuel Moore MRN: [id number] Date of Birth: 09-02-1951 Referring Provider:     Pulmonary Rehab from 12/21/2017 in [facility] Cardiac and Pulmonary Rehab  Referring Provider  Elenore Rota MD [[place]]      Encounter Date: 03/14/2018  Check In: Session Check In - 03/14/18 1137      Check-In   Location  [facility]-Cardiac & Pulmonary Rehab    Staff Present  Nada Maclachlan, BA, ACSM CEP, Exercise Physiologist;Kelly Amedeo Plenty, BS, ACSM CEP, Exercise Physiologist;Khadijatou Borak Flavia Shipper    Supervising physician immediately available to respond to emergencies  [facility] immediately available ER MD    Physician(s)  Dr. Jimmye Norman and Corky Downs    Medication changes reported      No    Fall or balance concerns reported     No    Tobacco Cessation  No Change    Warm-up and Cool-down  Performed as group-led instruction    Resistance Training Performed  Yes    VAD Patient?  No      Pain Assessment   Currently in Pain?  No/denies          Social History   Tobacco Use  Smoking Status Former Smoker  . Packs/day: 2.00  . Years: 10.00  . Pack years: 20.00  . Types: Cigarettes  . Last attempt to quit: 12/14/2002  . Years since quitting: 15.2  Smokeless Tobacco Never Used    Goals Met:  Independence with exercise equipment Exercise tolerated well No report of cardiac concerns or symptoms Strength training completed today  Goals Unmet:  Not Applicable  Comments: Pt able to follow exercise prescription today without complaint.  Will continue to monitor for progression.   Dr. Emily Filbert is Medical Director for [place] and [facility].

## 2018-03-16 DIAGNOSIS — J449 Chronic obstructive pulmonary disease, unspecified: Secondary | ICD-10-CM | POA: Diagnosis not present

## 2018-03-16 NOTE — Progress Notes (Signed)
Daily Session Note  Patient Details  Name: Samuel Moore MRN: [id number] Date of Birth: [birth year] Referring Provider:     Pulmonary Rehab from 12/21/2017 in [facility] Cardiac and Pulmonary Rehab  Referring Provider  Elenore Rota MD [[place]]      Encounter Date: 03/16/2018  Check In: Session Check In - 03/16/18 1137      Check-In   Location  [facility]-Cardiac & Pulmonary Rehab    Staff Present  Justin Mend RCP,RRT,BSRT;Meredith Sherryll Burger, RN BSN;Jessica Luan Pulling, MA, RCEP, CCRP, Exercise Physiologist    Supervising physician immediately available to respond to emergencies  [facility] immediately available ER MD    Physician(s)  Dr. Mable Paris and Jimmye Norman    Medication changes reported      No    Fall or balance concerns reported     No    Tobacco Cessation  No Change    Warm-up and Cool-down  Performed as group-led instruction    Resistance Training Performed  Yes    VAD Patient?  No      Pain Assessment   Currently in Pain?  No/denies          Social History   Tobacco Use  Smoking Status Former Smoker  . Packs/day: 2.00  . Years: 10.00  . Pack years: 20.00  . Types: Cigarettes  . Last attempt to quit: 12/14/2002  . Years since quitting: 15.2  Smokeless Tobacco Never Used    Goals Met:  Independence with exercise equipment Exercise tolerated well No report of cardiac concerns or symptoms Strength training completed today  Goals Unmet:  Not Applicable  Comments: Pt able to follow exercise prescription today without complaint.  Will continue to monitor for progression.   Dr. Emily Filbert is Medical Director for [place] and [facility].

## 2018-03-21 DIAGNOSIS — J449 Chronic obstructive pulmonary disease, unspecified: Secondary | ICD-10-CM

## 2018-03-21 NOTE — Progress Notes (Signed)
Pulmonary Individual Treatment Plan  Patient Details  Name: ALFERD OBRYANT MRN: [id number] Date of Birth: 10/21/1951 Referring Provider:     Pulmonary Rehab from 12/21/2017 in [facility]  Referring Provider  Calif[place], Lucy Chris MD [[facility]]      Initial Encounter Date:    Pulmonary Rehab from 12/21/2017 in [facility]  Date  12/21/17  Referring Provider  Elenore Rota MD [[facility]]      Visit Diagnosis: Chronic obstructive pulmonary disease, unspecified COPD type ([place])  Patient's Home Medications on Admission: No current outpatient medications on file.  Past Medical History: Past Medical History:  Diagnosis Date  . Cancer ([place])   . COPD (chronic obstructive pulmonary disease) ([place])   . Hypertension     Tobacco Use: Social History   Tobacco Use  Smoking Status Former Smoker  . Packs/day: 2.00  . Years: 10.00  . Pack years: 20.00  . Types: Cigarettes  . Last attempt to quit: 12/14/2002  . Years since quitting: 15.2  Smokeless Tobacco Never Used    Labs: Recent Review Flowsheet Data    There is no flowsheet data to display.       Pulmonary Assessment Scores: Pulmonary Assessment Scores    Row Name 12/21/17 1442 01/31/18 1143 02/08/18 1504     ADL [facility]   ADL Phase  Entry  Mid  Entry   SOB Score total  48  54  -   Rest  1  1  -   Walk  2  2  -   Stairs  4  5  -   Bath  1  2  -   Dress  1  1  -   Shop  3  2  -     CAT Score   CAT Score  27  -  -     mMRC Score   mMRC Score  -  -  2      Pulmonary Function Assessment: Pulmonary Function Assessment - 12/21/17 1509      Initial Spirometry Results   FVC%  81 %    FEV1%  80 %    FEV1/FVC Ratio  73.13    Comments  Best of 2 attempts, good patient effort      Post Bronchodilator Spirometry Results   FVC%  81 %    FEV1%  80 %    FEV1/FVC Ratio  67.51    Comments  Best of 2 attempts, good patient effort      Breath   Bilateral Breath Sounds  Clear    Shortness of Breath  Fear of Shortness of Breath;Limiting activity;Yes       Exercise Target Goals:    Exercise Program Goal: Individual exercise prescription set using results from initial 6 min walk test and THRR while considering  patient's activity barriers and safety.    Exercise Prescription Goal: Initial exercise prescription builds to 30-45 minutes a day of aerobic activity, 2-3 days per week.  Home exercise guidelines will be given to patient during program as part of exercise prescription that the participant will acknowledge.  Activity Barriers & Risk Stratification: Activity Barriers & Cardiac Risk Stratification - 12/21/17 1555      Activity Barriers & Cardiac Risk Stratification   Activity Barriers  Deconditioning;Muscular Weakness;Shortness of Breath       6 Minute Walk: 6 Minute Walk    Row Name 12/21/17 1552  6 Minute Walk   Phase  Initial     Distance  1333 feet     Walk Time  6 minutes     # of Rest Breaks  0     MPH  2.52     METS  3.9     RPE  11     Perceived Dyspnea   2     VO2 Peak  13.67     Symptoms  No     Resting HR  90 bpm     Resting BP  132/74     Resting Oxygen Saturation   100 %     Exercise Oxygen Saturation  during 6 min walk  99 %     Max Ex. HR  138 bpm     Max Ex. BP  142/72     2 Minute Post BP  136/76       Interval HR   1 Minute HR  126     2 Minute HR  129     3 Minute HR  132     4 Minute HR  132     5 Minute HR  133     6 Minute HR  138     2 Minute Post HR  105     Interval Heart Rate?  Yes       Interval Oxygen   Interval Oxygen?  Yes     Baseline Oxygen Saturation %  100 %     1 Minute Oxygen Saturation %  100 %     1 Minute Liters of Oxygen  0 L Room Air     2 Minute Oxygen Saturation %  100 %     2 Minute Liters of Oxygen  0 L     3 Minute Oxygen Saturation %  99 %     3 Minute Liters of Oxygen  0 L     4 Minute Oxygen Saturation %  99 %     4 Minute Liters of Oxygen  0 L     5 Minute Oxygen  Saturation %  99 %     5 Minute Liters of Oxygen  0 L     6 Minute Oxygen Saturation %  99 %     6 Minute Liters of Oxygen  0 L     2 Minute Post Oxygen Saturation %  100 %     2 Minute Post Liters of Oxygen  0 L       Oxygen Initial Assessment: Oxygen Initial Assessment - 12/21/17 1448      Home Oxygen   Home Oxygen Device  None    Sleep Oxygen Prescription  CPAP    Liters per minute  0    Home Exercise Oxygen Prescription  None    Home at Rest Exercise Oxygen Prescription  None    Compliance with Home Oxygen Use  No    Comments  -- he has not been wearing      Initial 6 min Walk   Oxygen Used  None      Program Oxygen Prescription   Program Oxygen Prescription  None      Intervention   Short Term Goals  To learn and demonstrate proper use of respiratory medications;To learn and demonstrate proper pursed lip breathing techniques or other breathing techniques.;To learn and understand importance of monitoring SPO2 with pulse oximeter and demonstrate accurate use of the pulse oximeter.;To learn and understand importance  of maintaining oxygen saturations>88%    Long  Term Goals  Verbalizes importance of monitoring SPO2 with pulse oximeter and return demonstration;Maintenance of O2 saturations>88%;Exhibits proper breathing techniques, such as pursed lip breathing or other method taught during program session;Compliance with respiratory medication;Demonstrates proper use of MDI's       Oxygen Re-Evaluation: Oxygen Re-Evaluation    Row Name 12/24/17 1137 01/17/18 1716 02/14/18 1208 03/09/18 1234       Program Oxygen Prescription   Program Oxygen Prescription  None  None  None  None      Home Oxygen   Home Oxygen Device  None  None  None  None    Sleep Oxygen Prescription  CPAP  CPAP  CPAP  CPAP    Liters per minute  0  0  0  0    Home Exercise Oxygen Prescription  None  None  None  None    Home at Rest Exercise Oxygen Prescription  None  None  None  None    Compliance with  Home Oxygen Use  No  No  No  No      Goals/Expected Outcomes   Short Term Goals  To learn and demonstrate proper use of respiratory medications;To learn and demonstrate proper pursed lip breathing techniques or other breathing techniques.;To learn and understand importance of monitoring SPO2 with pulse oximeter and demonstrate accurate use of the pulse oximeter.;To learn and understand importance of maintaining oxygen saturations>88%  To learn and demonstrate proper use of respiratory medications;To learn and demonstrate proper pursed lip breathing techniques or other breathing techniques.;To learn and understand importance of monitoring SPO2 with pulse oximeter and demonstrate accurate use of the pulse oximeter.;To learn and understand importance of maintaining oxygen saturations>88%  To learn and demonstrate proper use of respiratory medications;To learn and demonstrate proper pursed lip breathing techniques or other breathing techniques.;To learn and understand importance of monitoring SPO2 with pulse oximeter and demonstrate accurate use of the pulse oximeter.;To learn and understand importance of maintaining oxygen saturations>88%  To learn and demonstrate proper use of respiratory medications;To learn and demonstrate proper pursed lip breathing techniques or other breathing techniques.;To learn and understand importance of monitoring SPO2 with pulse oximeter and demonstrate accurate use of the pulse oximeter.;To learn and understand importance of maintaining oxygen saturations>88%    Long  Term Goals  Verbalizes importance of monitoring SPO2 with pulse oximeter and return demonstration;Maintenance of O2 saturations>88%;Exhibits proper breathing techniques, such as pursed lip breathing or other method taught during program session;Compliance with respiratory medication;Demonstrates proper use of MDI's  Verbalizes importance of monitoring SPO2 with pulse oximeter and return demonstration;Maintenance of  O2 saturations>88%;Exhibits proper breathing techniques, such as pursed lip breathing or other method taught during program session;Compliance with respiratory medication;Demonstrates proper use of MDI's  Verbalizes importance of monitoring SPO2 with pulse oximeter and return demonstration;Maintenance of O2 saturations>88%;Exhibits proper breathing techniques, such as pursed lip breathing or other method taught during program session;Compliance with respiratory medication;Demonstrates proper use of MDI's  Verbalizes importance of monitoring SPO2 with pulse oximeter and return demonstration;Maintenance of O2 saturations>88%;Exhibits proper breathing techniques, such as pursed lip breathing or other method taught during program session;Compliance with respiratory medication;Demonstrates proper use of MDI's    Comments  Reviewed PLB technique with pt.  Talked about how it work and it's important to maintaining his exercise saturations.    Mehki stated that when he went to the hospital the CPAP pressure that they used was too much and he coudl not tolerate  it. He says his machine is not set that high and has been trying to wear it more.  Kennard is wearing his CPAP every other night for about 4-5 hours. He can tell a difference when he doesnt wear it. He wants to move up to every night but taking it a little bit at a time.   Omar is trying to get used to his CPAP still. He states he uses it every other night. He checks his oxygen every once in awhile. He uses PLB when he gets short of breath.    Goals/Expected Outcomes  Short: Become more profiecient at using PLB.   Long: Become independent at using PLB.  Short: wear CPAP more often. Long: Wear CPAP everynight for sleep  Short: continue wearing CPAP longer each night Long: Wear CPAP every night.   Short: check oxygen more frequently with pulse oximeter. Long: check oxygen saturation with pulse oximeter independently       Oxygen Discharge (Final Oxygen  Re-Evaluation): Oxygen Re-Evaluation - 03/09/18 1234      Program Oxygen Prescription   Program Oxygen Prescription  None      Home Oxygen   Home Oxygen Device  None    Sleep Oxygen Prescription  CPAP    Liters per minute  0    Home Exercise Oxygen Prescription  None    Home at Rest Exercise Oxygen Prescription  None    Compliance with Home Oxygen Use  No      Goals/Expected Outcomes   Short Term Goals  To learn and demonstrate proper use of respiratory medications;To learn and demonstrate proper pursed lip breathing techniques or other breathing techniques.;To learn and understand importance of monitoring SPO2 with pulse oximeter and demonstrate accurate use of the pulse oximeter.;To learn and understand importance of maintaining oxygen saturations>88%    Long  Term Goals  Verbalizes importance of monitoring SPO2 with pulse oximeter and return demonstration;Maintenance of O2 saturations>88%;Exhibits proper breathing techniques, such as pursed lip breathing or other method taught during program session;Compliance with respiratory medication;Demonstrates proper use of MDI's    Comments  Lang is trying to get used to his CPAP still. He states he uses it every other night. He checks his oxygen every once in awhile. He uses PLB when he gets short of breath.    Goals/Expected Outcomes  Short: check oxygen more frequently with pulse oximeter. Long: check oxygen saturation with pulse oximeter independently       Initial Exercise Prescription: Initial Exercise Prescription - 12/21/17 1500      Date of Initial Exercise RX and Referring Provider   Date  12/21/17    Referring Provider  [facility]      Treadmill   MPH  2.4    Grade  0.5    Minutes  15    METs  3      NuStep   Level  4    SPM  80    Minutes  15    METs  3      Recumbant Elliptical   Level  2    RPM  50    Minutes  15    METs  3      Prescription Details   Frequency (times per week)  3     Duration  Progress to 45 minutes of aerobic exercise without signs/symptoms of physical distress      Intensity   THRR 40-80% of Max Heartrate  115-140  Ratings of Perceived Exertion  11-13    Perceived Dyspnea  0-4      Progression   Progression  Continue to progress workloads to maintain intensity without signs/symptoms of physical distress.      Resistance Training   Training Prescription  Yes    Weight  4 lbs    Reps  10-15       Perform Capillary Blood Glucose checks as needed.  Exercise Prescription Changes: Exercise Prescription Changes    Row Name 12/21/17 1500 12/29/17 0700 01/12/18 1200 01/25/18 1400 02/07/18 1600     Response to Exercise   Blood Pressure (Admit)  132/74  138/68  122/62  122/70  112/60   Blood Pressure (Exercise)  142/72  138/70  -  -  -   Blood Pressure (Exit)  136/76  126/60  114/62  112/64  116/70   Heart Rate (Admit)  90 bpm  99 bpm  101 bpm  112 bpm  97 bpm   Heart Rate (Exercise)  138 bpm  133 bpm  136 bpm  132 bpm  142 bpm   Heart Rate (Exit)  105 bpm  100 bpm  107 bpm  92 bpm  96 bpm   Oxygen Saturation (Admit)  100 %  99 %  100 %  100 %  99 %   Oxygen Saturation (Exercise)  99 %  97 %  99 %  94 %  95 %   Oxygen Saturation (Exit)  100 %  99 %  99 %  98 %  98 %   Rating of Perceived Exertion (Exercise)  11  13  11  13  13    Perceived Dyspnea (Exercise)  2  2  1  2  1    Symptoms  none  none  none  none  none   Comments  walk test results  first full day of exercise  -  -  -   Duration  -  Continue with 45 min of aerobic exercise without signs/symptoms of physical distress.  Continue with 45 min of aerobic exercise without signs/symptoms of physical distress.  Continue with 45 min of aerobic exercise without signs/symptoms of physical distress.  Continue with 45 min of aerobic exercise without signs/symptoms of physical distress.   Intensity  -  THRR unchanged  THRR unchanged  THRR unchanged  THRR unchanged     Progression   Progression  -   Continue to progress workloads to maintain intensity without signs/symptoms of physical distress.  Continue to progress workloads to maintain intensity without signs/symptoms of physical distress.  Continue to progress workloads to maintain intensity without signs/symptoms of physical distress.  Continue to progress workloads to maintain intensity without signs/symptoms of physical distress.   Average METs  -  2.2  3.17  3.32  3.31     Resistance Training   Training Prescription  -  Yes  Yes  Yes  Yes   Weight  -  4 lbs  4 lbs  4 lbs  5 lbs   Reps  -  10-15  10-15  10-15  10-15     Interval Training   Interval Training  -  No  No  No  No     Treadmill   MPH  -  2.4  2.4  2.5  2.7   Grade  -  0.5  0.5  1  1.5   Minutes  -  15  15  15  15    METs  -  3  3  3.26  3.63     NuStep   Level  -  4  4  4  4    SPM  -  59  99  100  100   Minutes  -  15  15  15  15    METs  -  2.1  4.2  4.4  4.2     Recumbant Elliptical   Level  -  2  2  2  2    RPM  -  33  65  63  61   Minutes  -  15  15  15  15    METs  -  1.5  2.3  2.3  2.1     Home Exercise Plan   Plans to continue exercise at  -  -  Home (comment) walking  Home (comment) walking  Home (comment) walking   Frequency  -  -  Add 1 additional day to program exercise sessions.  Add 1 additional day to program exercise sessions.  Add 1 additional day to program exercise sessions.   Initial Home Exercises Provided  -  -  01/12/18  01/12/18  01/12/18   Row Name 02/22/18 1400 03/09/18 1400           Response to Exercise   Blood Pressure (Admit)  122/82  128/66      Blood Pressure (Exit)  110/64  132/70      Heart Rate (Admit)  86 bpm  99 bpm      Heart Rate (Exercise)  116 bpm  121 bpm      Heart Rate (Exit)  86 bpm  97 bpm      Oxygen Saturation (Admit)  100 %  98 %      Oxygen Saturation (Exercise)  95 %  94 %      Oxygen Saturation (Exit)  97 %  98 %      Rating of Perceived Exertion (Exercise)  13  13      Perceived Dyspnea (Exercise)   1  1      Symptoms  none  none      Duration  Continue with 45 min of aerobic exercise without signs/symptoms of physical distress.  Continue with 45 min of aerobic exercise without signs/symptoms of physical distress.      Intensity  THRR unchanged  THRR unchanged        Progression   Progression  Continue to progress workloads to maintain intensity without signs/symptoms of physical distress.  Continue to progress workloads to maintain intensity without signs/symptoms of physical distress.      Average METs  3.08  3.44        Resistance Training   Training Prescription  Yes  Yes      Weight  5 lbs  10 lbs      Reps  10-15  10-15        Interval Training   Interval Training  No  No        Treadmill   MPH  2.7  3      Grade  1.5  1.5      Minutes  15  15      METs  3.63  3.9        NuStep   Level  5  5      SPM  82  80      Minutes  15  15      METs  3.5  3.6        Recumbant Elliptical   Level  4  4      RPM  42  51      Minutes  15  15      METs  2.1  2.8        Home Exercise Plan   Plans to continue exercise at  Home (comment) walking  Home (comment) walking      Frequency  Add 2 additional days to program exercise sessions.  Add 2 additional days to program exercise sessions.      Initial Home Exercises Provided  01/12/18  01/12/18         Exercise Comments: Exercise Comments    Row Name 12/24/17 1136           Exercise Comments  First full day of exercise!  Patient was oriented to gym and equipment including functions, settings, policies, and procedures.  Patient's individual exercise prescription and treatment plan were reviewed.  All starting workloads were established based on the results of the 6 minute walk test done at initial orientation visit.  The plan for exercise progression was also introduced and progression will be customized based on patient's performance and goals.          Exercise Goals and Review: Exercise Goals    Row Name 12/21/17 1558               Exercise Goals   Increase Physical Activity  Yes       Intervention  Provide advice, education, support and counseling about physical activity/exercise needs.;Develop an individualized exercise prescription for aerobic and resistive training based on initial evaluation findings, risk stratification, comorbidities and participant's personal goals.       Expected Outcomes  Achievement of increased cardiorespiratory fitness and enhanced flexibility, muscular endurance and strength shown through measurements of functional capacity and personal statement of participant.       Increase Strength and Stamina  Yes       Intervention  Provide advice, education, support and counseling about physical activity/exercise needs.;Develop an individualized exercise prescription for aerobic and resistive training based on initial evaluation findings, risk stratification, comorbidities and participant's personal goals.       Expected Outcomes  Achievement of increased cardiorespiratory fitness and enhanced flexibility, muscular endurance and strength shown through measurements of functional capacity and personal statement of participant.       Able to understand and use rate of perceived exertion (RPE) scale  Yes       Intervention  Provide education and explanation on how to use RPE scale       Expected Outcomes  Short Term: Able to use RPE daily in rehab to express subjective intensity level;Long Term:  Able to use RPE to guide intensity level when exercising independently       Able to understand and use Dyspnea scale  Yes       Intervention  Provide education and explanation on how to use Dyspnea scale       Expected Outcomes  Short Term: Able to use Dyspnea scale daily in rehab to express subjective sense of shortness of breath during exertion;Long Term: Able to use Dyspnea scale to guide intensity level when exercising independently       Knowledge and understanding of Target Heart Rate Range (THRR)   Yes       Intervention  Provide education and explanation of THRR including how the numbers were predicted and where  they are located for reference       Expected Outcomes  Short Term: Able to state/look up THRR;Long Term: Able to use THRR to govern intensity when exercising independently;Short Term: Able to use daily as guideline for intensity in rehab       Able to check pulse independently  Yes       Intervention  Provide education and demonstration on how to check pulse in carotid and radial arteries.;Review the importance of being able to check your own pulse for safety during independent exercise       Expected Outcomes  Short Term: Able to explain why pulse checking is important during independent exercise;Long Term: Able to check pulse independently and accurately       Understanding of Exercise Prescription  Yes       Intervention  Provide education, explanation, and written materials on patient's individual exercise prescription       Expected Outcomes  Short Term: Able to explain program exercise prescription;Long Term: Able to explain home exercise prescription to exercise independently          Exercise Goals Re-Evaluation : Exercise Goals Re-Evaluation    Row Name 12/24/17 1137 12/29/17 0752 01/12/18 1228 01/25/18 1433 02/07/18 1627     Exercise Goal Re-Evaluation   Exercise Goals Review  Able to understand and use Dyspnea scale;Understanding of Exercise Prescription;Knowledge and understanding of Target Heart Rate Range (THRR);Able to understand and use rate of perceived exertion (RPE) scale  Increase Physical Activity;Increase Strength and Stamina;Understanding of Exercise Prescription  Increase Physical Activity;Able to understand and use Dyspnea scale;Understanding of Exercise Prescription;Increase Strength and Stamina;Knowledge and understanding of Target Heart Rate Range (THRR);Able to understand and use rate of perceived exertion (RPE) scale;Able to check pulse independently   Increase Physical Activity;Understanding of Exercise Prescription;Increase Strength and Stamina  Increase Physical Activity;Understanding of Exercise Prescription;Increase Strength and Stamina   Comments  Reviewed RPE scale, THR and program prescription with pt today.  Pt voiced understanding and was given a copy of goals to take home.   Patrick Jupiter has completed his first full day of exercise.  He was able to do the full 45 min with minimal rest between stations.  We will continue to monitor his progression.   Patrick Jupiter is off to a good start in rehab.  He has already noticed that his strength and stamina are starting to get better.  He is enjoying coming to exercise.  Currently, he is not doing home exericse, but has been able to increase his activity levels at home.  Reviewed home exercise with pt today.  Pt plans to walk at home for exercise.  Reviewed THR, pulse, RPE, sign and symptoms, and when to call 911 or MD.  Also discussed weather considerations and indoor options.  Pt voiced understanding.  Patrick Jupiter has been doing well in rehab.  He has worked his way up to 4.4 METs on the NuStep.  We will work on increasing workloads especially on recumbent elliptical.  We will continue to monitor his progression.   Patrick Jupiter continues to do well in rehab. He is now up to 2.7 mph on the treadmill.  We will continue to monitor his progress.    Expected Outcomes  Short: Use RPE daily to regulate intensity.  Long: Follow program prescription in THR.  Short: Continue to attend [facility] regularly.  Long: Continue to follow program prescription.   Short: Add in at least one extra day a week at home for exercise.  Long: Continue to  increase physcial activity.   Short: Increase workload on REL and speed on treadmill. Long: Continue to exercise more at home.   Short: Increase workload on REL.  Long: Continue to exercise independently   [place] Name 02/22/18 1443 03/09/18 1233           Exercise Goal Re-Evaluation   Exercise Goals Review   Increase Physical Activity;Understanding of Exercise Prescription;Increase Strength and Stamina  Increase Physical Activity;Increase Strength and [place] has been doing well in rehab.   He is now up to level 4 on the REL and level 5 on the NuStep.  We will continue to monitor his progression.   Patrick Jupiter has been doing yard work and trying to walk [employer] he can. He is not interested in joining a gym after the program is done.      Expected Outcomes  Short: Continue to work on increasing workloads, encourage an increase in weights.  Long: Continue to exercise at home on off days.   Short: stay active whenever possible. Long: maintain exercise at home independently         Discharge Exercise Prescription (Final Exercise Prescription Changes): Exercise Prescription Changes - 03/09/18 1400      Response to Exercise   Blood Pressure (Admit)  128/66    Blood Pressure (Exit)  132/70    Heart Rate (Admit)  99 bpm    Heart Rate (Exercise)  121 bpm    Heart Rate (Exit)  97 bpm    Oxygen Saturation (Admit)  98 %    Oxygen Saturation (Exercise)  94 %    Oxygen Saturation (Exit)  98 %    Rating of Perceived Exertion (Exercise)  13    Perceived Dyspnea (Exercise)  1    Symptoms  none    Duration  Continue with 45 min of aerobic exercise without signs/symptoms of physical distress.    Intensity  THRR unchanged      Progression   Progression  Continue to progress workloads to maintain intensity without signs/symptoms of physical distress.    Average METs  3.44      Resistance Training   Training Prescription  Yes    Weight  10 lbs    Reps  10-15      Interval Training   Interval Training  No      Treadmill   MPH  3    Grade  1.5    Minutes  15    METs  3.9      NuStep   Level  5    SPM  80    Minutes  15    METs  3.6      Recumbant Elliptical   Level  4    RPM  51    Minutes  15    METs  2.8      Home Exercise Plan   Plans to continue exercise at  Home  (comment) walking    Frequency  Add 2 additional days to program exercise sessions.    Initial Home Exercises Provided  01/12/18       Nutrition:  Target Goals: Understanding of nutrition guidelines, daily intake of sodium <1580m, cholesterol <2029m calories 30% from fat and 7% or less from saturated fats, daily to have 5 or more servings of fruits and vegetables.  Biometrics: Pre Biometrics - 12/21/17 1600      Pre Biometrics   Height  6' 1.2" (1.859 m)  Weight  180 lb 12.8 oz (82 kg)    Waist Circumference  39.5 inches    Hip Circumference  39 inches    Waist to Hip Ratio  1.01 %    BMI (Calculated)  23.73        Nutrition Therapy Plan and Nutrition Goals: Nutrition Therapy & Goals - 01/12/18 1511      Personal Nutrition Goals   Nutrition Goal  Plan to eat a vegetable and/or a fruit with each meal. If canned, choose low-sodium vegetables and low-sugar fruits    Personal Goal #2  Switch to using 2% milk instead of whole milk.    Personal Goal #3  Decrease sugar from drinks -- this might help with appetite for solid foods. Try diet Dr. Malachi Bonds, and can try decreasing sugar used in tea gradually    Comments  Mr. Weathington reports improving appetite, but not up to normal yet. Discussed some strategies to help appetite as well as breathing. Encouraged him to eat foods that taste good to him, but make some healthy changes as able.        Nutrition Assessments:   Nutrition Goals Re-Evaluation: Nutrition Goals Re-Evaluation    [place] Name 01/17/18 1718 02/14/18 1205 03/09/18 1225         Goals   Current Weight  180 lb (81.6 kg)  181 lb (82.1 kg)  184 lb (83.5 kg)     Nutrition Goal  Eat less sugar and eat more vegetables.  Eat more vegetables and less sugary foods. Be mindful of cholestrol foods  Hulen wants to be around the 180-109lbs for his weight.     Comment  Swain has met with the dietician and has gave him some good insight on the things he should be eating.  Chiron  still has the suggestions from the dietician, it is hard for him to make the changes. He has cut down on his Dr. Malachi Bonds consumption and eats less hot dogs.   Alexzavier states that he is eating what he wants in moderation. He does not seemed concerned with changing his diet.     Expected Outcome  Short: decrease sugar intake from drinks. Long: try to drink diet sodas or maintain cutting out sugary drinks.  Short. decrease sugar intake and high cholesterol foods. Long: apply all changes recommended by dietician.   Short: maintain weight. Long continue to exercise indeoendently to maintain weight.        Nutrition Goals Discharge (Final Nutrition Goals Re-Evaluation): Nutrition Goals Re-Evaluation - 03/09/18 1225      Goals   Current Weight  184 lb (83.5 kg)    Nutrition Goal  Caeson wants to be around the 180-109lbs for his weight.    Comment  Aniket states that he is eating what he wants in moderation. He does not seemed concerned with changing his diet.    Expected Outcome  Short: maintain weight. Long continue to exercise indeoendently to maintain weight.       Psychosocial: Target Goals: Acknowledge presence or absence of significant depression and/or stress, maximize coping skills, provide positive support system. Participant is able to verbalize types and ability to use techniques and skills needed for reducing stress and depression.   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 He is really tired and sometimes he cannot get get out of bed. His COPD does not get to him unless it flares up.      Family Dynamics   Good Support System?  Yes    Comments  His daughter and his  wife are good support system.      Barriers   Psychosocial barriers to participate in program  The patient should benefit from training in stress management and relaxation.      Screening Interventions   Interventions  Yes;Program counselor consult;Provide feedback about the scores to participant;Encouraged to exercise;To provide support and resources with identified psychosocial needs    Expected Outcomes  Short Term goal: Utilizing psychosocial counselor, staff and physician to assist with identification of specific Stressors or current issues interfering with healing process. Setting desired goal for each stressor or current issue identified.;Long Term Goal: Stressors or current issues are controlled or eliminated.;Long Term goal: The participant improves quality of Life and PHQ9 Scores as seen by post scores and/or verbalization of changes;Short Term goal: Identification and review with participant of any Quality of Life or Depression concerns found by scoring the questionnaire.       Quality of Life Scores:  Scores of 19 and below usually indicate a poorer quality of life in these areas.  A difference of  2-3 points is a clinically meaningful difference.  A difference of 2-3 points in the total score of the Quality of Life Index has been associated with significant improvement in overall quality of life, self-image, physical symptoms, and general health in studies assessing change in quality of life.  PHQ-9: Recent Review Flowsheet Data    Depression screen [place] 2/9 01/31/2018 12/21/2017   Decreased Interest 1 2   Down, Depressed, Hopeless 1 1   PHQ - 2 Score 2 3   Altered sleeping 2 3   Tired, decreased energy 2 2   Change in appetite 1 0   Feeling bad or failure about yourself  2 1   Trouble concentrating 1 2   Moving slowly or fidgety/restless 0 0   Suicidal thoughts 1  0   PHQ-9 Score 11 11   Difficult doing work/chores Somewhat difficult Somewhat difficult     Interpretation of  Total Score  Total Score Depression Severity:  1-4 = Minimal depression, 5-9 = Mild depression, 10-14 = Moderate depression, 15-19 = Moderately severe depression, 20-27 = Severe depression   Psychosocial Evaluation and Intervention: Psychosocial Evaluation - 01/24/18 1219      Psychosocial Evaluation & Interventions   Interventions  Encouraged to exercise with the program and follow exercise prescription;Stress management education;Relaxation education    Comments  Counselor met with Mr. Lowdermilk Gill) today for initial psychosocial evaluation.  He is a 67 year old who has been diagnosed with COPD.  Coolidge has a great support system as a newlywed of 2 months.  He has multiple health issues having battled lung cancer several years ago and bladder cancer just recently.  He has chronic sleep problems with only 4-5 hours/night typically and has a goal to get at least 6.  He has a good appetite and is generally in a positive mood.  Joanna denies a history of depression or anxiety or any current symptoms.  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  He has goals to  breathe better and increase his energy.  Staff will follow with Inri throughout the course of this program.      Expected Outcomes  Hezakiah will benefit from consistent exercise to achieve his stated goals.  The educational and psychoeducational components of this program will be helpful in understanding and coping more positively.      Continue Psychosocial Services   Follow up required by staff       Psychosocial Re-Evaluation: Psychosocial Re-Evaluation    [place] Name 01/17/18 1721 01/31/18 1228 03/09/18 1223         Psychosocial Re-Evaluation   Current issues with  Current Stress Concerns;Current Depression;Current Sleep Concerns  Current Stress Concerns;Current Depression;Current Sleep Concerns  Current Stress Concerns;Current Depression;Current Sleep Concerns      Comments  Robb states that his stress levels have not changed too much since the start of the program. He is coming to the program regularly and has been improving. He always has a good attitude and even jokes a little in class.  Kenith is having a hard time with his VA check. He states they are trying to go from 60 percent to 0 percent. He relies on that income to live and he is trying to fight for his check. He states that his bladder cancer diagnosis evidently is not a valid diagnosis for his check. He is dong all he can at the  moment and will know what the outcome will be in about 60 days.  Ara still does not have answers on his income. That is the most of his stress concerns at the moment. He says he has been sleeping better but has not made any changes attributing to that.     Expected Outcomes  Short: attend [facility] to reduce stress. Long: maintain an exercise routine to decrease or eliminate stress.  Short: fight for his VA check. Long: maintain a positive outlook no matter the outcome of his VA check.  Short: get information on his income check. Long: maintain a positive outlook no matter the outcome of his income.     Interventions  Encouraged to attend [facility] for the exercise  Encouraged to attend [facility] for the exercise  Encouraged to attend [facility] for the exercise     Continue Psychosocial Services   Follow up required by staff  Follow up required by staff  Follow up required by staff        Psychosocial Discharge (Final Psychosocial Re-Evaluation): Psychosocial Re-Evaluation - 03/09/18 1223      Psychosocial Re-Evaluation   Current issues with  Current Stress Concerns;Current Depression;Current Sleep Concerns    Comments  Gaberial still does not have answers on his income. That is the most of his stress concerns at the moment. He says he has been sleeping better but has not made any changes attributing to that.    Expected  Outcomes  Short: get information on his income check. Long: maintain a positive outlook no matter the outcome of his income.    Interventions  Encouraged to attend [facility] for the exercise    Continue Psychosocial Services   Follow up required by staff       Education: Education Goals: Education classes will be provided on a weekly basis, covering required topics. Participant will state understanding/return demonstration of topics presented.  Learning Barriers/Preferences: Learning Barriers/Preferences - 12/21/17 1444      Learning Barriers/Preferences   Learning Barriers  Sight glasses    Learning Preferences  None  Education Topics:  Initial Evaluation Education: - Verbal, written and demonstration of respiratory meds, oximetry and breathing techniques. Instruction on use of nebulizers and MDIs and importance of monitoring MDI activations.   Pulmonary Rehab from 03/14/2018 in [facility] Cardiac and Pulmonary Rehab  Date  12/21/17  Educator  [facility]  Instruction Review Code  1- Verbalizes Understanding      General Nutrition Guidelines/Fats and Fiber: -Group instruction provided by verbal, written material, models and posters to present the general guidelines for heart healthy nutrition. Gives an explanation and review of dietary fats and fiber.   Pulmonary Rehab from 03/14/2018 in [facility] Cardiac and Pulmonary Rehab  Date  01/10/18  Educator  [facility]  Instruction Review Code  1- Verbalizes Understanding      Controlling Sodium/Reading Food Labels: -Group verbal and written material supporting the discussion of sodium use in heart healthy nutrition. Review and explanation with models, verbal and written materials for utilization of the food label.   Pulmonary Rehab from 03/14/2018 in [facility] Cardiac and Pulmonary Rehab  Date  03/14/18  Educator  [facility]  Instruction Review Code  1- Verbalizes Understanding      Exercise Physiology & General Exercise Guidelines: - Group verbal  and written instruction with models to review the exercise physiology of the cardiovascular system and associated critical values. Provides general exercise guidelines with specific guidelines to those with heart or lung disease.    Aerobic Exercise & Resistance Training: - Gives group verbal and written instruction on the various components of exercise. Focuses on aerobic and resistive training programs and the benefits of this training and how to safely progress through these programs.   Pulmonary Rehab from 03/14/2018 in [facility] Cardiac and Pulmonary Rehab  Date  12/31/17  Educator  [facility]  Instruction Review Code  1- Verbalizes Understanding      Flexibility, Balance, Mind/Body Relaxation: Provides group verbal/written instruction on the benefits of flexibility and balance training, including mind/body exercise modes such as yoga, pilates and tai chi.  Demonstration and skill practice provided.   Pulmonary Rehab from 03/14/2018 in [facility] Cardiac and Pulmonary Rehab  Date  01/26/18  Educator  [facility]  Instruction Review Code  1- Verbalizes Understanding      Stress and Anxiety: - Provides group verbal and written instruction about the health risks of elevated stress and causes of high stress.  Discuss the correlation between heart/lung disease and anxiety and treatment options. Review healthy ways to manage with stress and anxiety.   Pulmonary Rehab from 03/14/2018 in [facility] Cardiac and Pulmonary Rehab  Date  02/16/18  Educator  [facility]  Instruction Review Code  1- Verbalizes Understanding      Depression: - Provides group verbal and written instruction on the correlation between heart/lung disease and depressed mood, treatment options, and the stigmas associated with seeking treatment.   Pulmonary Rehab from 03/14/2018 in [facility] Cardiac and Pulmonary Rehab  Date  02/02/18  Educator  [facility]  Instruction Review Code  1- Verbalizes Understanding      Exercise & Equipment Safety: - Individual verbal  instruction and demonstration of equipment use and safety with use of the equipment.   Pulmonary Rehab from 03/14/2018 in [facility] Cardiac and Pulmonary Rehab  Date  12/21/17  Educator  [facility]  Instruction Review Code  1- Verbalizes Understanding      Infection Prevention: - Provides verbal and written material to individual with discussion of infection control including proper hand washing and proper equipment cleaning during exercise session.  Pulmonary Rehab from 03/14/2018 in [facility] Cardiac and Pulmonary Rehab  Date  12/21/17  Educator  [facility]  Instruction Review Code  1- Verbalizes Understanding      Falls Prevention: - Provides verbal and written material to individual with discussion of falls prevention and safety.   Pulmonary Rehab from 03/14/2018 in [facility] Cardiac and Pulmonary Rehab  Date  12/21/17  Educator  [facility]  Instruction Review Code  1- Verbalizes Understanding      Diabetes: - Individual verbal and written instruction to review signs/symptoms of diabetes, desired ranges of glucose level fasting, after meals and with exercise. Advice that pre and post exercise glucose checks will be done for 3 sessions at entry of program.   Chronic Lung Diseases: - Group verbal and written instruction to review updates, respiratory medications, advancements in procedures and treatments. Discuss use of supplemental oxygen including available portable oxygen systems, continuous and intermittent flow rates, concentrators, personal use and safety guidelines. Review proper use of inhaler and spacers. Provide informative websites for self-education.    Pulmonary Rehab from 03/14/2018 in [facility] Cardiac and Pulmonary Rehab  Date  03/09/18  Educator  [facility]  Instruction Review Code  1- Verbalizes Understanding      Energy Conservation: - Provide group verbal and written instruction for methods to conserve energy, plan and organize activities. Instruct on pacing techniques, use of adaptive equipment and  posture/positioning to relieve shortness of breath.   Pulmonary Rehab from 03/14/2018 in [facility] Cardiac and Pulmonary Rehab  Date  02/09/18  Educator  [facility]  Instruction Review Code  1- Verbalizes Understanding      Triggers and Exacerbations: - Group verbal and written instruction to review types of environmental triggers and ways to prevent exacerbations. Discuss weather changes, air quality and the benefits of nasal washing. Review warning signs and symptoms to help prevent infections. Discuss techniques for effective airway clearance, coughing, and vibrations.   AED/CPR: - Group verbal and written instruction with the use of models to demonstrate the basic use of the AED with the basic ABC's of resuscitation.   Pulmonary Rehab from 03/14/2018 in [facility] Cardiac and Pulmonary Rehab  Date  02/11/18  Educator  [facility]  Instruction Review Code  1- Actuary and Physiology of the Lungs: - Group verbal and written instruction with the use of models to provide basic lung anatomy and physiology related to function, structure and complications of lung disease.   Anatomy & Physiology of the Heart: - Group verbal and written instruction and models provide basic cardiac anatomy and physiology, with the coronary electrical and arterial systems. Review of Valvular disease and Heart Failure   Pulmonary Rehab from 03/14/2018 in [facility] Cardiac and Pulmonary Rehab  Date  01/12/18  Educator  [facility]  Instruction Review Code  1- Verbalizes Understanding      Cardiac Medications: - Group verbal and written instruction to review commonly prescribed medications for heart disease. Reviews the medication, class of the drug, and side effects.   Pulmonary Rehab from 03/14/2018 in [facility] Cardiac and Pulmonary Rehab  Date  01/28/18  Educator  [facility]  Instruction Review Code  1- Verbalizes Understanding      Know Your Numbers and Risk Factors: -Group verbal and written instruction about important numbers  in your health.  Discussion of what are risk factors and how they play a role in the disease process.  Review of Cholesterol, Blood Pressure, Diabetes, and BMI and the role they play in your overall  health.   Sleep Hygiene: -Provides group verbal and written instruction about how sleep can affect your health.  Define sleep hygiene, discuss sleep cycles and impact of sleep habits. Review good sleep hygiene tips.    Other: -Provides group and verbal instruction on various topics (see comments)   Pulmonary Rehab from 03/14/2018 in [facility] Cardiac and Pulmonary Rehab  Date  01/05/18  Educator  [facility]  Instruction Review Code  1- Verbalizes Understanding [SLEEP]       Knowledge Questionnaire Score: Knowledge Questionnaire Score - 12/21/17 1444      Knowledge Questionnaire Score   Pre Score  15/18 reviewed with patient        Core Components/Risk Factors/Patient Goals at Admission: Personal Goals and Risk Factors at Admission - 12/21/17 1449      Core Components/Risk Factors/Patient Goals on Admission    Weight Management  Yes;Weight Maintenance    Intervention  Weight Management: Develop a combined nutrition and exercise program designed to reach desired caloric intake, while maintaining appropriate intake of nutrient and fiber, sodium and fats, and appropriate energy expenditure required for the weight goal.;Weight Management: Provide education and appropriate resources to help participant work on and attain dietary goals.;Weight Management/Obesity: Establish reasonable short term and long term weight goals.    Admit Weight  180 lb 12.8 oz (82 kg)    Goal Weight: Short Term  175 lb (79.4 kg)    Goal Weight: Long Term  180 lb (81.6 kg)    Expected Outcomes  Short Term: Continue to assess and modify interventions until short term weight is achieved;Long Term: Adherence to nutrition and physical activity/exercise program aimed toward attainment of established weight goal;Weight Maintenance:  Understanding of the daily nutrition guidelines, which includes 25-35% calories from fat, 7% or less cal from saturated fats, less than 259m cholesterol, less than 1.5gm of sodium, & 5 or more servings of fruits and vegetables daily;Understanding recommendations for meals to include 15-35% energy as protein, 25-35% energy from fat, 35-60% energy from carbohydrates, less than 202mof dietary cholesterol, 20-35 gm of total fiber daily;Understanding of distribution of calorie intake throughout the day with the consumption of 4-5 meals/snacks    Improve shortness of breath with ADL's  Yes    Intervention  Provide education, individualized exercise plan and daily activity instruction to help decrease symptoms of SOB with activities of daily living.    Expected Outcomes  Short Term: Achieves a reduction of symptoms when performing activities of daily living.    Lipids  Yes    Intervention  Provide education and support for participant on nutrition & aerobic/resistive exercise along with prescribed medications to achieve LDL <7027mHDL >93m31m  Expected Outcomes  Short Term: Participant states understanding of desired cholesterol values and is compliant with medications prescribed. Participant is following exercise prescription and nutrition guidelines.;Long Term: Cholesterol controlled with medications as prescribed, with individualized exercise RX and with personalized nutrition plan. Value goals: LDL < 70mg22mL > 40 mg.       Core Components/Risk Factors/Patient Goals Review:  Goals and Risk Factor Review    [place] Name 01/17/18 1714 02/14/18 1201 03/09/18 1237         Core Components/Risk Factors/Patient Goals Review   Personal Goals Review  Weight Management/Obesity;Improve shortness of breath with ADL's;Lipids  Weight Management/Obesity;Improve shortness of breath with ADL's;Lipids  Weight Management/Obesity;Improve shortness of breath with ADL's;Lipids     Review  GarveOaklanbeen doing great in  [place]ung[place] improving his  levels on all exercises. His shortness of breath has got a little better doing things around the house. His blood pressure has been within normal limits.  Vasco has been continually improving with exercise. He continues to increase is levels on all of his equipment. He reports a one pound gain in the last month, but he is happy with his current weight. He has noticed a change in his breathing throughout the program. He is taking his cholesterol medicine and attempting to watch what he is eating  Fotios has been feeling well at home and does not get short of breath when doing his dailey activities. He is comfortable with his weight. He is not sure what his cholestorol is but knows he has been tested somewhat recently.     Expected Outcomes  Short: attend [facility] regularly. Long: maintain exercise after [facility].  Short: continue to attend [facility] and challenge himself with exercise. Long: apply lifestyle changes after graduation, apply lipid control changes.   Short: Learn what his Cholestorol levels are. Long: maintain medication and lipid levels.        Core Components/Risk Factors/Patient Goals at Discharge (Final Review):  Goals and Risk Factor Review - 03/09/18 1237      Core Components/Risk Factors/Patient Goals Review   Personal Goals Review  Weight Management/Obesity;Improve shortness of breath with ADL's;Lipids    Review  Kristian has been feeling well at home and does not get short of breath when doing his dailey activities. He is comfortable with his weight. He is not sure what his cholestorol is but knows he has been tested somewhat recently.    Expected Outcomes  Short: Learn what his Cholestorol levels are. Long: maintain medication and lipid levels.       ITP Comments: ITP Comments    Row Name 12/21/17 1420 12/27/17 0836 12/31/17 1246 01/24/18 0937 01/24/18 1226   ITP Comments  Medical Evaluation completed. Chart sent for review and changes to Dr.  Emily Filbert Director of [place]. Diagnosis can be found in [facility] encounter 12/21/17  30 day review completed. ITP sent to Dr. Emily Filbert Director of [place]. Continue with ITP unless changes are made by physician.  Crews has informed us that he has a small aortic aneurysm that the doctors are going to keep an eye on. He states that he is not restricted from [place].  30 day review completed. ITP sent to Dr. Emily Filbert Director of [place]. Continue with ITP unless changes are made by physician.  Kashten states that his doctor checked his abdominal aneurysm and no changes have occured and it is ok to continue with exercise.   [place] Name 02/21/18 0839 03/21/18 0902         ITP Comments  30 day review completed. ITP sent to Dr. Emily Filbert Director of [place]. Continue with ITP unless changes are made by physician.   30 day review completed. ITP sent to Dr. Emily Filbert Director of [place]. Continue with ITP unless changes are made by physician         Comments: 30 day review

## 2018-03-23 ENCOUNTER — Encounter: Payer: Non-veteran care | Admitting: *Deleted

## 2018-03-23 DIAGNOSIS — J449 Chronic obstructive pulmonary disease, unspecified: Secondary | ICD-10-CM

## 2018-03-23 NOTE — Progress Notes (Signed)
Daily Session Note  Patient Details  Name: Samuel Moore MRN: [id number] Date of Birth: 25-Aug-1951 Referring Provider:     Pulmonary Rehab from 12/21/2017 in [facility] Cardiac and Pulmonary Rehab  Referring Provider  Elenore Rota MD [[facility]]      Encounter Date: 03/23/2018  Check In: Session Check In - 03/23/18 1116      Check-In   Location  [facility]-Cardiac & Pulmonary Rehab    Staff Present  Renita Papa, RN BSN;Jessica Luan [place], [place], RCEP, CCRP, Exercise Physiologist;Joseph Flavia Shipper    Supervising physician immediately available to respond to emergencies  [facility] immediately available ER MD    Physician(s)  Dr. Jimmye Norman and Clearnce Hasten    Medication changes reported      No    Fall or balance concerns reported     No    Tobacco Cessation  No Change    Warm-up and Cool-down  Performed as group-led instruction    Resistance Training Performed  Yes    VAD Patient?  No      Pain Assessment   Currently in Pain?  No/denies          Social History   Tobacco Use  Smoking Status Former Smoker  . Packs/day: 2.00  . Years: 10.00  . Pack years: 20.00  . Types: Cigarettes  . Last attempt to quit: 12/14/2002  . Years since quitting: 15.2  Smokeless Tobacco Never Used    Goals Met:  Proper associated with RPD/PD & O2 Sat Independence with exercise equipment Using PLB without cueing & demonstrates good technique Exercise tolerated well Strength training completed today  Goals Unmet:  Not Applicable  Comments: Pt able to follow exercise prescription today without complaint.  Will continue to monitor for progression.    Dr. Emily Filbert is Medical Director for [place] and [facility].

## 2018-03-25 VITALS — Ht 73.2 in | Wt 190.0 lb

## 2018-03-25 DIAGNOSIS — J449 Chronic obstructive pulmonary disease, unspecified: Secondary | ICD-10-CM

## 2018-03-25 NOTE — Progress Notes (Signed)
Daily Session Note  Patient Details  Name: Samuel Moore MRN: [id number] Date of Birth: 07-19-1951 Referring Provider:     Pulmonary Rehab from 12/21/2017 in [facility] Cardiac and Pulmonary Rehab  Referring Provider  Elenore Rota MD [[facility]]      Encounter Date: 03/25/2018  Check In: Session Check In - 03/25/18 1123      Check-In   Location  [facility]-Cardiac & Pulmonary Rehab    Staff Present  Alberteen Sam, MA, RCEP, CCRP, Exercise Physiologist;Iran Kievit Alcus Dad, RN BSN    Supervising physician immediately available to respond to emergencies  [facility] immediately available ER MD    Physician(s)  Dr. Alfred Levins and [facility]    Medication changes reported      No    Fall or balance concerns reported     No    Tobacco Cessation  No Change    Warm-up and Cool-down  Performed as group-led instruction    Resistance Training Performed  Yes    VAD Patient?  No      Pain Assessment   Currently in Pain?  No/denies          Social History   Tobacco Use  Smoking Status Former Smoker  . Packs/day: 2.00  . Years: 10.00  . Pack years: 20.00  . Types: Cigarettes  . Last attempt to quit: 12/14/2002  . Years since quitting: 15.2  Smokeless Tobacco Never Used    Goals Met:  Independence with exercise equipment Exercise tolerated well No report of cardiac concerns or symptoms Strength training completed today  Goals Unmet:  Not Applicable  Comments: Pt able to follow exercise prescription today without complaint.  Will continue to monitor for progression. Tok Name 12/21/17 1552 03/25/18 1220       6 Minute Walk   Phase  Initial  Discharge    Distance  1333 feet  1660 feet    Distance % Change  -  19.7 %    Distance Feet Change  -  327 ft    Walk Time  6 minutes  6 minutes    # of Rest Breaks  0  0    MPH  2.52  3.14    METS  3.9  4.31    RPE  11  11    Perceived Dyspnea   2  1    VO2 Peak  13.67  15.07    Symptoms  No  No     Resting HR  90 bpm  85 bpm    Resting BP  132/74  122/66    Resting Oxygen Saturation   100 %  100 %    Exercise Oxygen Saturation  during 6 min walk  99 %  94 %    Max Ex. HR  138 bpm  120 bpm    Max Ex. BP  142/72  154/74    2 Minute Post BP  136/76  132/64      Interval HR   1 Minute HR  126  98    2 Minute HR  129  110    3 Minute HR  132  115    4 Minute HR  132  114    5 Minute HR  133  120    6 Minute HR  138  110    2 Minute Post HR  105  76    Interval Heart Rate?  Yes  Yes  Interval Oxygen   Interval Oxygen?  Yes  Yes    Baseline Oxygen Saturation %  100 %  100 %    1 Minute Oxygen Saturation %  100 %  100 %    1 Minute Liters of Oxygen  0 L Room Air  0 L Room Air    2 Minute Oxygen Saturation %  100 %  96 %    2 Minute Liters of Oxygen  0 L  0 L    3 Minute Oxygen Saturation %  99 %  94 %    3 Minute Liters of Oxygen  0 L  0 L    4 Minute Oxygen Saturation %  99 %  99 %    4 Minute Liters of Oxygen  0 L  0 L    5 Minute Oxygen Saturation %  99 %  99 %    5 Minute Liters of Oxygen  0 L  0 L    6 Minute Oxygen Saturation %  99 %  98 %    6 Minute Liters of Oxygen  0 L  0 L    2 Minute Post Oxygen Saturation %  100 %  99 %    2 Minute Post Liters of Oxygen  0 L  0 L        Dr. Emily Filbert is Medical Director for [place] and [facility].

## 2018-03-28 DIAGNOSIS — J449 Chronic obstructive pulmonary disease, unspecified: Secondary | ICD-10-CM

## 2018-03-28 NOTE — Progress Notes (Signed)
Daily Session Note  Patient Details  Name: Samuel Moore MRN: [id number] Date of Birth: 26-Jul-1951 Referring Provider:     Pulmonary Rehab from 12/21/2017 in [facility] Cardiac and Pulmonary Rehab  Referring Provider  Elenore Rota MD [[facility]]      Encounter Date: 03/28/2018  Check In: Session Check In - 03/28/18 1121      Check-In   Location  [facility]-Cardiac & Pulmonary Rehab    Staff Present  Earlean Shawl, BS, ACSM CEP, Exercise Physiologist;Amanda Oletta Darter, BA, ACSM CEP, Exercise Physiologist;Yashar Inclan Flavia Shipper    Supervising physician immediately available to respond to emergencies  [facility] immediately available ER MD    Physician(s)  Dr. Joni Fears and Burlene Arnt    Medication changes reported      No    Fall or balance concerns reported     No    Tobacco Cessation  No Change    Warm-up and Cool-down  Performed as group-led instruction    Resistance Training Performed  Yes    VAD Patient?  No      Pain Assessment   Currently in Pain?  No/denies          Social History   Tobacco Use  Smoking Status Former Smoker  . Packs/day: 2.00  . Years: 10.00  . Pack years: 20.00  . Types: Cigarettes  . Last attempt to quit: 12/14/2002  . Years since quitting: 15.2  Smokeless Tobacco Never Used    Goals Met:  Independence with exercise equipment Exercise tolerated well No report of cardiac concerns or symptoms Strength training completed today  Goals Unmet:  Not Applicable  Comments: Pt able to follow exercise prescription today without complaint.  Will continue to monitor for progression.   Dr. Emily Filbert is Medical Director for [place] and [facility].

## 2018-03-30 DIAGNOSIS — J449 Chronic obstructive pulmonary disease, unspecified: Secondary | ICD-10-CM | POA: Diagnosis not present

## 2018-03-30 NOTE — Progress Notes (Signed)
Discharge Progress Report  Patient Details  Name: Samuel Moore MRN: [id number] Date of Birth: 30-Dec-1950 Referring Provider:     Pulmonary Rehab from 12/21/2017 in [facility] Cardiac and Pulmonary Rehab  Referring Provider  Elenore Rota MD [[facility]]       Number of Visits: 36/36  Reason for Discharge:  Patient reached a stable level of exercise. Patient independent in their exercise. Patient has met program and personal goals.  Smoking History:  Social History   Tobacco Use  Smoking Status Former Smoker  . Packs/day: 2.00  . Years: 10.00  . Pack years: 20.00  . Types: Cigarettes  . Last attempt to quit: 12/14/2002  . Years since quitting: 15.3  Smokeless Tobacco Never Used    Diagnosis:  Chronic obstructive pulmonary disease, unspecified COPD type ([place])  ADL UCSD: Pulmonary Assessment Scores    Row Name 12/21/17 1442 01/31/18 1143 02/08/18 1504     ADL UCSD   ADL Phase  Entry  Mid  Entry   SOB Score total  48  54  -   Rest  1  1  -   Walk  2  2  -   Stairs  4  5  -   Bath  1  2  -   Dress  1  1  -   Shop  3  2  -     CAT Score   CAT Score  27  -  -     mMRC Score   mMRC Score  -  -  2   Row Name 03/23/18 1244 03/30/18 1121       ADL UCSD   ADL Phase  Exit  Exit    SOB Score total  46  -    Rest  2  -    Walk  3  -    Stairs  4  -    Bath  2  -    Dress  1  -    Shop  1  -      CAT Score   CAT Score  20  -      mMRC Score   mMRC Score  -  1       Initial Exercise Prescription: Initial Exercise Prescription - 12/21/17 1500      Date of Initial Exercise RX and Referring Provider   Date  12/21/17    Referring Provider  [facility]      Treadmill   MPH  2.4    Grade  0.5    Minutes  15    METs  3      NuStep   Level  4    SPM  80    Minutes  15    METs  3      Recumbant Elliptical   Level  2    RPM  50    Minutes  15    METs  3      Prescription Details   Frequency (times per week)  3    Duration  Progress  to 45 minutes of aerobic exercise without signs/symptoms of physical distress      Intensity   THRR 40-80% of Max Heartrate  115-140    Ratings of Perceived Exertion  11-13    Perceived Dyspnea  0-4      Progression   Progression  Continue to progress workloads to maintain intensity without signs/symptoms of physical distress.  Resistance Training   Training Prescription  Yes    Weight  4 lbs    Reps  10-15       Discharge Exercise Prescription (Final Exercise Prescription Changes): Exercise Prescription Changes - 03/21/18 1600      Response to Exercise   Blood Pressure (Admit)  126/68    Blood Pressure (Exit)  132/64    Heart Rate (Admit)  96 bpm    Heart Rate (Exercise)  134 bpm    Heart Rate (Exit)  94 bpm    Oxygen Saturation (Admit)  100 %    Oxygen Saturation (Exercise)  100 %    Oxygen Saturation (Exit)  98 %    Rating of Perceived Exertion (Exercise)  13    Perceived Dyspnea (Exercise)  1    Symptoms  none    Duration  Continue with 45 min of aerobic exercise without signs/symptoms of physical distress.    Intensity  THRR unchanged      Progression   Progression  Continue to progress workloads to maintain intensity without signs/symptoms of physical distress.    Average METs  3.54      Resistance Training   Training Prescription  Yes    Weight  8 lbs    Reps  10-15      Treadmill   MPH  3    Grade  1.5    Minutes  15    METs  3.92      NuStep   Level  5    SPM  86    Minutes  15    METs  4.1      Recumbant Elliptical   Level  4    RPM  54    Minutes  15    METs  2.6      Home Exercise Plan   Plans to continue exercise at  Home (comment) walking    Frequency  Add 2 additional days to program exercise sessions.    Initial Home Exercises Provided  01/12/18       Functional Capacity: 6 Minute Walk    Row Name 12/21/17 1552 03/25/18 1220       6 Minute Walk   Phase  Initial  Discharge    Distance  1333 feet  1660 feet    Distance %  Change  -  19.7 %    Distance Feet Change  -  327 ft    Walk Time  6 minutes  6 minutes    # of Rest Breaks  0  0    MPH  2.52  3.14    METS  3.9  4.31    RPE  11  11    Perceived Dyspnea   2  1    VO2 Peak  13.67  15.07    Symptoms  No  No    Resting HR  90 bpm  85 bpm    Resting BP  132/74  122/66    Resting Oxygen Saturation   100 %  100 %    Exercise Oxygen Saturation  during 6 min walk  99 %  94 %    Max Ex. HR  138 bpm  120 bpm    Max Ex. BP  142/72  154/74    2 Minute Post BP  136/76  132/64      Interval HR   1 Minute HR  126  98    2 Minute HR  129  110  3 Minute HR  132  115    4 Minute HR  132  114    5 Minute HR  133  120    6 Minute HR  138  110    2 Minute Post HR  105  76    Interval Heart Rate?  Yes  Yes      Interval Oxygen   Interval Oxygen?  Yes  Yes    Baseline Oxygen Saturation %  100 %  100 %    1 Minute Oxygen Saturation %  100 %  100 %    1 Minute Liters of Oxygen  0 L Room Air  0 L Room Air    2 Minute Oxygen Saturation %  100 %  96 %    2 Minute Liters of Oxygen  0 L  0 L    3 Minute Oxygen Saturation %  99 %  94 %    3 Minute Liters of Oxygen  0 L  0 L    4 Minute Oxygen Saturation %  99 %  99 %    4 Minute Liters of Oxygen  0 L  0 L    5 Minute Oxygen Saturation %  99 %  99 %    5 Minute Liters of Oxygen  0 L  0 L    6 Minute Oxygen Saturation %  99 %  98 %    6 Minute Liters of Oxygen  0 L  0 L    2 Minute Post Oxygen Saturation %  100 %  99 %    2 Minute Post Liters of Oxygen  0 L  0 L       Psychological, QOL, Others - Outcomes: PHQ 2/9: Depression screen [facility] 2/9 03/23/2018 01/31/2018 12/21/2017  Decreased Interest 1 1 2   Down, Depressed, Hopeless 1 1 1   PHQ - 2 Score 2 2 3   Altered sleeping 2 2 3   Tired, decreased energy 2 2 2   Change in appetite 1 1 0  Feeling bad or failure about yourself  0 2 1  Trouble concentrating 1 1 2   Moving slowly or fidgety/restless 0 0 0  Suicidal thoughts 1 1 0  PHQ-9 Score 9 11 11   Difficult  doing work/chores Not difficult at all Somewhat difficult Somewhat difficult    Quality of Life:   Personal Goals: Goals established at orientation with interventions provided to work toward goal. Personal Goals and Risk Factors at Admission - 12/21/17 1449      Core Components/Risk Factors/Patient Goals on Admission    Weight Management  Yes;Weight Maintenance    Intervention  Weight Management: Develop a combined nutrition and exercise program designed to reach desired caloric intake, while maintaining appropriate intake of nutrient and fiber, sodium and fats, and appropriate energy expenditure required for the weight goal.;Weight Management: Provide education and appropriate resources to help participant work on and attain dietary goals.;Weight Management/Obesity: Establish reasonable short term and long term weight goals.    Admit Weight  180 lb 12.8 oz (82 kg)    Goal Weight: Short Term  175 lb (79.4 kg)    Goal Weight: Long Term  180 lb (81.6 kg)    Expected Outcomes  Short Term: Continue to assess and modify interventions until short term weight is achieved;Long Term: Adherence to nutrition and physical activity/exercise program aimed toward attainment of established weight goal;Weight Maintenance: Understanding of the daily nutrition guidelines, which includes 25-35% calories from fat, 7% or  less cal from saturated fats, less than 239m cholesterol, less than 1.5gm of sodium, & 5 or more servings of fruits and vegetables daily;Understanding recommendations for meals to include 15-35% energy as protein, 25-35% energy from fat, 35-60% energy from carbohydrates, less than 2041mof dietary cholesterol, 20-35 gm of total fiber daily;Understanding of distribution of calorie intake throughout the day with the consumption of 4-5 meals/snacks    Improve shortness of breath with ADL's  Yes    Intervention  Provide education, individualized exercise plan and daily activity instruction to help decrease  symptoms of SOB with activities of daily living.    Expected Outcomes  Short Term: Achieves a reduction of symptoms when performing activities of daily living.    Lipids  Yes    Intervention  Provide education and support for participant on nutrition & aerobic/resistive exercise along with prescribed medications to achieve LDL <7070mHDL >4m65m  Expected Outcomes  Short Term: Participant states understanding of desired cholesterol values and is compliant with medications prescribed. Participant is following exercise prescription and nutrition guidelines.;Long Term: Cholesterol controlled with medications as prescribed, with individualized exercise RX and with personalized nutrition plan. Value goals: LDL < 70mg41mL > 40 mg.        Personal Goals Discharge: Goals and Risk Factor Review    Row Name 01/17/18 1714 02/14/18 1201 03/09/18 1237 03/28/18 1202       Core Components/Risk Factors/Patient Goals Review   Personal Goals Review  Weight Management/Obesity;Improve shortness of breath with ADL's;Lipids  Weight Management/Obesity;Improve shortness of breath with ADL's;Lipids  Weight Management/Obesity;Improve shortness of breath with ADL's;Lipids  Weight Management/Obesity;Improve shortness of breath with ADL's;Lipids    Review  Samuel [facility] doing [facility] improving his levels on all exercises. His shortness of breath has got a little better doing things around the house. His blood pressure has been within normal limits.  Samuel Moore continually improving with exercise. He continues to increase is levels on all of his equipment. He reports a one pound gain in the last month, but he is happy with his current weight. He has noticed a change in his breathing throughout the program. He is taking his cholesterol medicine and attempting to watch what he is eating  Samuel Moore feeling well at home and does not get short of breath when doing his dailey activities. He is  comfortable with his weight. He is not sure what his cholestorol is but knows he has been tested somewhat recently.  Samuel Moore that he does not know specificially what his cholestorol numbers are but that he recently had lab work and was told everything was within good ranges. He currently takes his cholesterol meds as perscribed. He is still comfortable with maintaining his current weight.     Expected Outcomes  Short: attend [facility] regularly. Long: maintain exercise after [facility].  Short: continue to attend [facility] and challenge himself with exercise. Long: apply lifestyle changes after graduation, apply lipid control changes.   Short: Learn what his Cholestorol levels are. Long: maintain medication and lipid levels.  Short: Continue taking cholestorol medications. Long: Maintain weight and lipid levels.        Exercise Goals and Review: Exercise Goals    Row Name 12/21/17 1558             Exercise Goals   Increase Physical Activity  Yes       Intervention  Provide advice, education, support and counseling about physical activity/exercise  needs.;Develop an individualized exercise prescription for aerobic and resistive training based on initial evaluation findings, risk stratification, comorbidities and participant's personal goals.       Expected Outcomes  Achievement of increased cardiorespiratory fitness and enhanced flexibility, muscular endurance and strength shown through measurements of functional capacity and personal statement of participant.       Increase Strength and Stamina  Yes       Intervention  Provide advice, education, support and counseling about physical activity/exercise needs.;Develop an individualized exercise prescription for aerobic and resistive training based on initial evaluation findings, risk stratification, comorbidities and participant's personal goals.       Expected Outcomes  Achievement of increased cardiorespiratory fitness and enhanced flexibility,  muscular endurance and strength shown through measurements of functional capacity and personal statement of participant.       Able to understand and use rate of perceived exertion (RPE) scale  Yes       Intervention  Provide education and explanation on how to use RPE scale       Expected Outcomes  Short Term: Able to use RPE daily in rehab to express subjective intensity level;Long Term:  Able to use RPE to guide intensity level when exercising independently       Able to understand and use Dyspnea scale  Yes       Intervention  Provide education and explanation on how to use Dyspnea scale       Expected Outcomes  Short Term: Able to use Dyspnea scale daily in rehab to express subjective sense of shortness of breath during exertion;Long Term: Able to use Dyspnea scale to guide intensity level when exercising independently       Knowledge and understanding of Target Heart Rate Range (THRR)  Yes       Intervention  Provide education and explanation of THRR including how the numbers were predicted and where they are located for reference       Expected Outcomes  Short Term: Able to state/look up THRR;Long Term: Able to use THRR to govern intensity when exercising independently;Short Term: Able to use daily as guideline for intensity in rehab       Able to check pulse independently  Yes       Intervention  Provide education and demonstration on how to check pulse in carotid and radial arteries.;Review the importance of being able to check your own pulse for safety during independent exercise       Expected Outcomes  Short Term: Able to explain why pulse checking is important during independent exercise;Long Term: Able to check pulse independently and accurately       Understanding of Exercise Prescription  Yes       Intervention  Provide education, explanation, and written materials on patient's individual exercise prescription       Expected Outcomes  Short Term: Able to explain program exercise  prescription;Long Term: Able to explain home exercise prescription to exercise independently          Nutrition & Weight - Outcomes: Pre Biometrics - 12/21/17 1600      Pre Biometrics   Height  6' 1.2" (1.859 m)    Weight  180 lb 12.8 oz (82 kg)    Waist Circumference  39.5 inches    Hip Circumference  39 inches    Waist to Hip Ratio  1.01 %    BMI (Calculated)  23.73      Post Biometrics - 03/25/18 1222  Post  Biometrics   Height  6' 1.2" (1.859 m)    Weight  190 lb (86.2 kg)    Waist Circumference  38 inches    Hip Circumference  38 inches    Waist to Hip Ratio  1 %    BMI (Calculated)  24.94       Nutrition: Nutrition Therapy & Goals - 01/12/18 1511      Personal Nutrition Goals   Nutrition Goal  Plan to eat a vegetable and/or a fruit with each meal. If canned, choose low-sodium vegetables and low-sugar fruits    Personal Goal #2  Switch to using 2% milk instead of whole milk.    Personal Goal #3  Decrease sugar from drinks -- this might help with appetite for solid foods. Try diet Dr. Malachi Bonds, and can try decreasing sugar used in tea gradually    Comments  Mr. Doolin reports improving appetite, but not up to normal yet. Discussed some strategies to help appetite as well as breathing. Encouraged him to eat foods that taste good to him, but make some healthy changes as able.        Nutrition Discharge: Nutrition Assessments - 03/23/18 1246      MEDFICTS Scores   Post Score  56       Education Questionnaire Score: Knowledge Questionnaire Score - 03/23/18 1245      Knowledge Questionnaire Score   Pre Score  15/18    Post Score  18/18 reviewed with patient       Goals reviewed with patient; copy given to patient.

## 2018-03-30 NOTE — Patient Instructions (Signed)
Discharge Patient Instructions  Patient Details  Name: Samuel Moore MRN: [id number] Date of Birth: 1950-12-23 Referring Provider:  [facility]   Number of Visits: 36/36  Reason for Discharge:  Patient reached a stable level of exercise. Patient independent in their exercise. Patient has met program and personal goals.  Smoking History:  Social History   Tobacco Use  Smoking Status Former Smoker  . Packs/day: 2.00  . Years: 10.00  . Pack years: 20.00  . Types: Cigarettes  . Last attempt to quit: 12/14/2002  . Years since quitting: 15.3  Smokeless Tobacco Never Used    Diagnosis:  Chronic obstructive pulmonary disease, unspecified COPD type ([place])  Initial Exercise Prescription: Initial Exercise Prescription - 12/21/17 1500      Date of Initial Exercise RX and Referring Provider   Date  12/21/17    Referring Provider  Samuel Moore      Treadmill   MPH  2.4    Grade  0.5    Minutes  15    METs  3      NuStep   Level  4    SPM  80    Minutes  15    METs  3      Recumbant Elliptical   Level  2    RPM  50    Minutes  15    METs  3      Prescription Details   Frequency (times per week)  3    Duration  Progress to 45 minutes of aerobic exercise without signs/symptoms of physical distress      Intensity   THRR 40-80% of Max Heartrate  115-140    Ratings of Perceived Exertion  11-13    Perceived Dyspnea  0-4      Progression   Progression  Continue to progress workloads to maintain intensity without signs/symptoms of physical distress.      Resistance Training   Training Prescription  Yes    Weight  4 lbs    Reps  10-15       Discharge Exercise Prescription (Final Exercise Prescription Changes): Exercise Prescription Changes - 03/21/18 1600      Response to Exercise   Blood Pressure (Admit)  126/68    Blood Pressure (Exit)  132/64    Heart Rate (Admit)  96 bpm    Heart Rate (Exercise)  134 bpm    Heart Rate (Exit)   94 bpm    Oxygen Saturation (Admit)  100 %    Oxygen Saturation (Exercise)  100 %    Oxygen Saturation (Exit)  98 %    Rating of Perceived Exertion (Exercise)  13    Perceived Dyspnea (Exercise)  1    Symptoms  none    Duration  Continue with 45 min of aerobic exercise without signs/symptoms of physical distress.    Intensity  THRR unchanged      Progression   Progression  Continue to progress workloads to maintain intensity without signs/symptoms of physical distress.    Average METs  3.54      Resistance Training   Training Prescription  Yes    Weight  8 lbs    Reps  10-15      Treadmill   MPH  3    Grade  1.5    Minutes  15    METs  3.92      NuStep   Level  5    SPM  86    Minutes  15    METs  4.1      Recumbant Elliptical   Level  4    RPM  54    Minutes  15    METs  2.6      Home Exercise Plan   Plans to continue exercise at  Home (comment) walking    Frequency  Add 2 additional days to program exercise sessions.    Initial Home Exercises Provided  01/12/18       Functional Capacity: 6 Minute Walk    Row Name 12/21/17 1552 03/25/18 1220       6 Minute Walk   Phase  Initial  Discharge    Distance  1333 feet  1660 feet    Distance % Change  -  19.7 %    Distance Feet Change  -  327 ft    Walk Time  6 minutes  6 minutes    # of Rest Breaks  0  0    MPH  2.52  3.14    METS  3.9  4.31    RPE  11  11    Perceived Dyspnea   2  1    VO2 Peak  13.67  15.07    Symptoms  No  No    Resting HR  90 bpm  85 bpm    Resting BP  132/74  122/66    Resting Oxygen Saturation   100 %  100 %    Exercise Oxygen Saturation  during 6 min walk  99 %  94 %    Max Ex. HR  138 bpm  120 bpm    Max Ex. BP  142/72  154/74    2 Minute Post BP  136/76  132/64      Interval HR   1 Minute HR  126  98    2 Minute HR  129  110    3 Minute HR  132  115    4 Minute HR  132  114    5 Minute HR  133  120    6 Minute HR  138  110    2 Minute Post HR  105  76    Interval Heart  Rate?  Yes  Yes      Interval Oxygen   Interval Oxygen?  Yes  Yes    Baseline Oxygen Saturation %  100 %  100 %    1 Minute Oxygen Saturation %  100 %  100 %    1 Minute Liters of Oxygen  0 L Room Air  0 L Room Air    2 Minute Oxygen Saturation %  100 %  96 %    2 Minute Liters of Oxygen  0 L  0 L    3 Minute Oxygen Saturation %  99 %  94 %    3 Minute Liters of Oxygen  0 L  0 L    4 Minute Oxygen Saturation %  99 %  99 %    4 Minute Liters of Oxygen  0 L  0 L    5 Minute Oxygen Saturation %  99 %  99 %    5 Minute Liters of Oxygen  0 L  0 L    6 Minute Oxygen Saturation %  99 %  98 %    6 Minute Liters of Oxygen  0 L  0 L    2  Minute Post Oxygen Saturation %  100 %  99 %    2 Minute Post Liters of Oxygen  0 L  0 L       Quality of Life:   Personal Goals: Goals established at orientation with interventions provided to work toward goal. Personal Goals and Risk Factors at Admission - 12/21/17 1449      Core Components/Risk Factors/Patient Goals on Admission    Weight Management  Yes;Weight Maintenance    Intervention  Weight Management: Develop a combined nutrition and exercise program designed to reach desired caloric intake, while maintaining appropriate intake of nutrient and fiber, sodium and fats, and appropriate energy expenditure required for the weight goal.;Weight Management: Provide education and appropriate resources to help participant work on and attain dietary goals.;Weight Management/Obesity: Establish reasonable short term and long term weight goals.    Admit Weight  180 lb 12.8 oz (82 kg)    Goal Weight: Short Term  175 lb (79.4 kg)    Goal Weight: Long Term  180 lb (81.6 kg)    Expected Outcomes  Short Term: Continue to assess and modify interventions until short term weight is achieved;Long Term: Adherence to nutrition and physical activity/exercise program aimed toward attainment of established weight goal;Weight Maintenance: Understanding of the daily nutrition  guidelines, which includes 25-35% calories from fat, 7% or less cal from saturated fats, less than 233m cholesterol, less than 1.5gm of sodium, & 5 or more servings of fruits and vegetables daily;Understanding recommendations for meals to include 15-35% energy as protein, 25-35% energy from fat, 35-60% energy from carbohydrates, less than 2075mof dietary cholesterol, 20-35 gm of total fiber daily;Understanding of distribution of calorie intake throughout the day with the consumption of 4-5 meals/snacks    Improve shortness of breath with ADL's  Yes    Intervention  Provide education, individualized exercise plan and daily activity instruction to help decrease symptoms of SOB with activities of daily living.    Expected Outcomes  Short Term: Achieves a reduction of symptoms when performing activities of daily living.    Lipids  Yes    Intervention  Provide education and support for participant on nutrition & aerobic/resistive exercise along with prescribed medications to achieve LDL <7083mHDL >73m87m  Expected Outcomes  Short Term: Participant states understanding of desired cholesterol values and is compliant with medications prescribed. Participant is following exercise prescription and nutrition guidelines.;Long Term: Cholesterol controlled with medications as prescribed, with individualized exercise RX and with personalized nutrition plan. Value goals: LDL < 70mg39mL > 40 mg.        Personal Goals Discharge: Goals and Risk Factor Review - 03/28/18 1202      Core Components/Risk Factors/Patient Goals Review   Personal Goals Review  Weight Management/Obesity;Improve shortness of breath with ADL's;Lipids    Review  GarveLanied that he does not know specificially what his cholestorol numbers are but that he recently had lab work and was told everything was within good ranges. He currently takes his cholesterol meds as perscribed. He is still comfortable with maintaining his current weight.      Expected Outcomes  Short: Continue taking cholestorol medications. Long: Maintain weight and lipid levels.        Exercise Goals and Review: Exercise Goals    Row Name 12/21/17 1558             Exercise Goals   Increase Physical Activity  Yes       Intervention  Provide advice,  education, support and counseling about physical activity/exercise needs.;Develop an individualized exercise prescription for aerobic and resistive training based on initial evaluation findings, risk stratification, comorbidities and participant's personal goals.       Expected Outcomes  Achievement of increased cardiorespiratory fitness and enhanced flexibility, muscular endurance and strength shown through measurements of functional capacity and personal statement of participant.       Increase Strength and Stamina  Yes       Intervention  Provide advice, education, support and counseling about physical activity/exercise needs.;Develop an individualized exercise prescription for aerobic and resistive training based on initial evaluation findings, risk stratification, comorbidities and participant's personal goals.       Expected Outcomes  Achievement of increased cardiorespiratory fitness and enhanced flexibility, muscular endurance and strength shown through measurements of functional capacity and personal statement of participant.       Able to understand and use rate of perceived exertion (RPE) scale  Yes       Intervention  Provide education and explanation on how to use RPE scale       Expected Outcomes  Short Term: Able to use RPE daily in rehab to express subjective intensity level;Long Term:  Able to use RPE to guide intensity level when exercising independently       Able to understand and use Dyspnea scale  Yes       Intervention  Provide education and explanation on how to use Dyspnea scale       Expected Outcomes  Short Term: Able to use Dyspnea scale daily in rehab to express subjective sense of  shortness of breath during exertion;Long Term: Able to use Dyspnea scale to guide intensity level when exercising independently       Knowledge and understanding of Target Heart Rate Range (THRR)  Yes       Intervention  Provide education and explanation of THRR including how the numbers were predicted and where they are located for reference       Expected Outcomes  Short Term: Able to state/look up THRR;Long Term: Able to use THRR to govern intensity when exercising independently;Short Term: Able to use daily as guideline for intensity in rehab       Able to check pulse independently  Yes       Intervention  Provide education and demonstration on how to check pulse in carotid and radial arteries.;Review the importance of being able to check your own pulse for safety during independent exercise       Expected Outcomes  Short Term: Able to explain why pulse checking is important during independent exercise;Long Term: Able to check pulse independently and accurately       Understanding of Exercise Prescription  Yes       Intervention  Provide education, explanation, and written materials on patient's individual exercise prescription       Expected Outcomes  Short Term: Able to explain program exercise prescription;Long Term: Able to explain home exercise prescription to exercise independently          Nutrition & Weight - Outcomes: Pre Biometrics - 12/21/17 1600      Pre Biometrics   Height  6' 1.2" (1.859 m)    Weight  180 lb 12.8 oz (82 kg)    Waist Circumference  39.5 inches    Hip Circumference  39 inches    Waist to Hip Ratio  1.01 %    BMI (Calculated)  23.73      Post Biometrics - 03/25/18  [place]   Height  6' 1.2" (1.859 m)    Weight  190 lb (86.2 kg)    Waist Circumference  38 inches    Hip Circumference  38 inches    Waist to Hip Ratio  1 %    BMI (Calculated)  24.94       Nutrition: Nutrition Therapy & Goals - 01/12/18 1511      Personal Nutrition  Goals   Nutrition Goal  Plan to eat a vegetable and/or a fruit with each meal. If canned, choose low-sodium vegetables and low-sugar fruits    Personal Goal #2  Switch to using 2% milk instead of whole milk.    Personal Goal #3  Decrease sugar from drinks -- this might help with appetite for solid foods. Try diet Dr. Malachi Bonds, and can try decreasing sugar used in tea gradually    Comments  Mr. Kagel reports improving appetite, but not up to normal yet. Discussed some strategies to help appetite as well as breathing. Encouraged him to eat foods that taste good to him, but make some healthy changes as able.        Nutrition Discharge: Nutrition Assessments - 03/23/18 1246      MEDFICTS Scores   Post Score  56       Education Questionnaire Score: Knowledge Questionnaire Score - 03/23/18 1245      Knowledge Questionnaire Score   Pre Score  15/18    Post Score  18/18 reviewed with patient       Goals reviewed with patient; copy given to patient.

## 2018-03-30 NOTE — Progress Notes (Signed)
Pulmonary Individual Treatment Plan  Patient Details  Name: Samuel Moore MRN: [id number] Date of Birth: 1951-08-11 Referring Provider:     Pulmonary Rehab from 12/21/2017 in [facility] Cardiac and Pulmonary Rehab  Referring Provider  Califano, Lucy Chris MD [[facility]]      Initial Encounter Date:    Pulmonary Rehab from 12/21/2017 in [facility] Cardiac and Pulmonary Rehab  Date  12/21/17  Referring Provider  Elenore Rota MD [[facility]]      Visit Diagnosis: Chronic obstructive pulmonary disease, unspecified COPD type ([place])  Patient's Home Medications on Admission: No current outpatient medications on file.  Past Medical History: Past Medical History:  Diagnosis Date  . Cancer ([place])   . COPD (chronic obstructive pulmonary disease) ([place])   . Hypertension     Tobacco Use: Social History   Tobacco Use  Smoking Status Former Smoker  . Packs/day: 2.00  . Years: 10.00  . Pack years: 20.00  . Types: Cigarettes  . Last attempt to quit: 12/14/2002  . Years since quitting: 15.3  Smokeless Tobacco Never Used    Labs: Recent Review Flowsheet Data    There is no flowsheet data to display.       Pulmonary Assessment Scores: Pulmonary Assessment Scores    Row Name 12/21/17 1442 01/31/18 1143 02/08/18 1504     ADL [facility]   ADL Phase  Entry  Mid  Entry   SOB Score total  48  54  -   Rest  1  1  -   Walk  2  2  -   Stairs  4  5  -   Bath  1  2  -   Dress  1  1  -   Shop  3  2  -     CAT Score   CAT Score  27  -  -     mMRC Score   mMRC Score  -  -  2   Row Name 03/23/18 1244 03/30/18 1121       ADL [facility]   ADL Phase  Exit  Exit    SOB Score total  46  -    Rest  2  -    Walk  3  -    Stairs  4  -    Bath  2  -    Dress  1  -    Shop  1  -      CAT Score   CAT Score  20  -      mMRC Score   mMRC Score  -  1       Pulmonary Function Assessment: Pulmonary Function Assessment - 12/21/17 1509      Initial Spirometry Results   FVC%  81 %    FEV1%  80 %    FEV1/FVC  Ratio  73.13    Comments  Best of 2 attempts, good patient effort      Post Bronchodilator Spirometry Results   FVC%  81 %    FEV1%  80 %    FEV1/FVC Ratio  67.51    Comments  Best of 2 attempts, good patient effort      Breath   Bilateral Breath Sounds  Clear    Shortness of Breath  Fear of Shortness of Breath;Limiting activity;Yes       Exercise Target Goals:    Exercise Program Goal: Individual exercise prescription set using results from initial 6 min walk test and  THRR while considering  patient's activity barriers and safety.    Exercise Prescription Goal: Initial exercise prescription builds to 30-45 minutes a day of aerobic activity, 2-3 days per week.  Home exercise guidelines will be given to patient during program as part of exercise prescription that the participant will acknowledge.  Activity Barriers & Risk Stratification: Activity Barriers & Cardiac Risk Stratification - 12/21/17 1555      Activity Barriers & Cardiac Risk Stratification   Activity Barriers  Deconditioning;Muscular Weakness;Shortness of Breath       6 Minute Walk: 6 Minute Walk    Row Name 12/21/17 1552 03/25/18 1220       6 Minute Walk   Phase  Initial  Discharge    Distance  1333 feet  1660 feet    Distance % Change  -  19.7 %    Distance Feet Change  -  327 ft    Walk Time  6 minutes  6 minutes    # of Rest Breaks  0  0    MPH  2.52  3.14    METS  3.9  4.31    RPE  11  11    Perceived Dyspnea   2  1    VO2 Peak  13.67  15.07    Symptoms  No  No    Resting HR  90 bpm  85 bpm    Resting BP  132/74  122/66    Resting Oxygen Saturation   100 %  100 %    Exercise Oxygen Saturation  during 6 min walk  99 %  94 %    Max Ex. HR  138 bpm  120 bpm    Max Ex. BP  142/72  154/74    2 Minute Post BP  136/76  132/64      Interval HR   1 Minute HR  126  98    2 Minute HR  129  110    3 Minute HR  132  115    4 Minute HR  132  114    5 Minute HR  133  120    6 Minute HR  138  110     2 Minute Post HR  105  76    Interval Heart Rate?  Yes  Yes      Interval Oxygen   Interval Oxygen?  Yes  Yes    Baseline Oxygen Saturation %  100 %  100 %    1 Minute Oxygen Saturation %  100 %  100 %    1 Minute Liters of Oxygen  0 L Room Air  0 L Room Air    2 Minute Oxygen Saturation %  100 %  96 %    2 Minute Liters of Oxygen  0 L  0 L    3 Minute Oxygen Saturation %  99 %  94 %    3 Minute Liters of Oxygen  0 L  0 L    4 Minute Oxygen Saturation %  99 %  99 %    4 Minute Liters of Oxygen  0 L  0 L    5 Minute Oxygen Saturation %  99 %  99 %    5 Minute Liters of Oxygen  0 L  0 L    6 Minute Oxygen Saturation %  99 %  98 %    6 Minute Liters of Oxygen  0 L  0 L  2 Minute Post Oxygen Saturation %  100 %  99 %    2 Minute Post Liters of Oxygen  0 L  0 L      Oxygen Initial Assessment: Oxygen Initial Assessment - 12/21/17 1448      Home Oxygen   Home Oxygen Device  None    Sleep Oxygen Prescription  CPAP    Liters per minute  0    Home Exercise Oxygen Prescription  None    Home at Rest Exercise Oxygen Prescription  None    Compliance with Home Oxygen Use  No    Comments  -- he has not been wearing      Initial 6 min Walk   Oxygen Used  None      Program Oxygen Prescription   Program Oxygen Prescription  None      Intervention   Short Term Goals  To learn and demonstrate proper use of respiratory medications;To learn and demonstrate proper pursed lip breathing techniques or other breathing techniques.;To learn and understand importance of monitoring SPO2 with pulse oximeter and demonstrate accurate use of the pulse oximeter.;To learn and understand importance of maintaining oxygen saturations>88%    Long  Term Goals  Verbalizes importance of monitoring SPO2 with pulse oximeter and return demonstration;Maintenance of O2 saturations>88%;Exhibits proper breathing techniques, such as pursed lip breathing or other method taught during program session;Compliance with  respiratory medication;Demonstrates proper use of MDI's       Oxygen Re-Evaluation: Oxygen Re-Evaluation    Row Name 12/24/17 1137 01/17/18 1716 02/14/18 1208 03/09/18 1234 03/28/18 1211     Program Oxygen Prescription   Program Oxygen Prescription  None  None  None  None  None     Home Oxygen   Home Oxygen Device  None  None  None  None  None   Sleep Oxygen Prescription  CPAP  CPAP  CPAP  CPAP  CPAP   Liters per minute  0  0  0  0  0   Home Exercise Oxygen Prescription  None  None  None  None  None   Home at Rest Exercise Oxygen Prescription  None  None  None  None  None   Compliance with Home Oxygen Use  No  No  No  No  No     Goals/Expected Outcomes   Short Term Goals  To learn and demonstrate proper use of respiratory medications;To learn and demonstrate proper pursed lip breathing techniques or other breathing techniques.;To learn and understand importance of monitoring SPO2 with pulse oximeter and demonstrate accurate use of the pulse oximeter.;To learn and understand importance of maintaining oxygen saturations>88%  To learn and demonstrate proper use of respiratory medications;To learn and demonstrate proper pursed lip breathing techniques or other breathing techniques.;To learn and understand importance of monitoring SPO2 with pulse oximeter and demonstrate accurate use of the pulse oximeter.;To learn and understand importance of maintaining oxygen saturations>88%  To learn and demonstrate proper use of respiratory medications;To learn and demonstrate proper pursed lip breathing techniques or other breathing techniques.;To learn and understand importance of monitoring SPO2 with pulse oximeter and demonstrate accurate use of the pulse oximeter.;To learn and understand importance of maintaining oxygen saturations>88%  To learn and demonstrate proper use of respiratory medications;To learn and demonstrate proper pursed lip breathing techniques or other breathing techniques.;To learn and  understand importance of monitoring SPO2 with pulse oximeter and demonstrate accurate use of the pulse oximeter.;To learn and understand importance of maintaining oxygen saturations>88%  To learn and demonstrate proper use of respiratory medications;To learn and demonstrate proper pursed lip breathing techniques or other breathing techniques.;To learn and understand importance of monitoring SPO2 with pulse oximeter and demonstrate accurate use of the pulse oximeter.;To learn and understand importance of maintaining oxygen saturations>88%   Long  Term Goals  Verbalizes importance of monitoring SPO2 with pulse oximeter and return demonstration;Maintenance of O2 saturations>88%;Exhibits proper breathing techniques, such as pursed lip breathing or other method taught during program session;Compliance with respiratory medication;Demonstrates proper use of MDI's  Verbalizes importance of monitoring SPO2 with pulse oximeter and return demonstration;Maintenance of O2 saturations>88%;Exhibits proper breathing techniques, such as pursed lip breathing or other method taught during program session;Compliance with respiratory medication;Demonstrates proper use of MDI's  Verbalizes importance of monitoring SPO2 with pulse oximeter and return demonstration;Maintenance of O2 saturations>88%;Exhibits proper breathing techniques, such as pursed lip breathing or other method taught during program session;Compliance with respiratory medication;Demonstrates proper use of MDI's  Verbalizes importance of monitoring SPO2 with pulse oximeter and return demonstration;Maintenance of O2 saturations>88%;Exhibits proper breathing techniques, such as pursed lip breathing or other method taught during program session;Compliance with respiratory medication;Demonstrates proper use of MDI's  Verbalizes importance of monitoring SPO2 with pulse oximeter and return demonstration;Maintenance of O2 saturations>88%;Exhibits proper breathing techniques,  such as pursed lip breathing or other method taught during program session;Compliance with respiratory medication;Demonstrates proper use of MDI's   Comments  Reviewed PLB technique with pt.  Talked about how it work and it's important to maintaining his exercise saturations.    Marlos stated that when he went to the hospital the CPAP pressure that they used was too much and he coudl not tolerate it. He says his machine is not set that high and has been trying to wear it more.  Delano is wearing his CPAP every other night for about 4-5 hours. He can tell a difference when he doesnt wear it. He wants to move up to every night but taking it a little bit at a time.   Aengus is trying to get used to his CPAP still. He states he uses it every other night. He checks his oxygen every once in awhile. He uses PLB when he gets short of breath.  Gurney is trying to use his CPAP machine at home but does not use it as perscribed. He uses it for a few hours each night and then takes it off. He sleeps on an incline to help him breath better at night. He does use PBL during the day and states that his SOB seems to be under control during ADL's.    Goals/Expected Outcomes  Short: Become more profiecient at using PLB.   Long: Become independent at using PLB.  Short: wear CPAP more often. Long: Wear CPAP everynight for sleep  Short: continue wearing CPAP longer each night Long: Wear CPAP every night.   Short: check oxygen more frequently with pulse oximeter. Long: check oxygen saturation with pulse oximeter independently  Short: state using CPAP as perscribed. Long: continue to improve shortness of breath as he continues to use breathing techniques and complience with perscribed medications       Oxygen Discharge (Final Oxygen Re-Evaluation): Oxygen Re-Evaluation - 03/28/18 1211      Program Oxygen Prescription   Program Oxygen Prescription  None      Home Oxygen   Home Oxygen Device  None    Sleep Oxygen Prescription   CPAP    Liters per minute  0  Home Exercise Oxygen Prescription  None    Home at Rest Exercise Oxygen Prescription  None    Compliance with Home Oxygen Use  No      Goals/Expected Outcomes   Short Term Goals  To learn and demonstrate proper use of respiratory medications;To learn and demonstrate proper pursed lip breathing techniques or other breathing techniques.;To learn and understand importance of monitoring SPO2 with pulse oximeter and demonstrate accurate use of the pulse oximeter.;To learn and understand importance of maintaining oxygen saturations>88%    Long  Term Goals  Verbalizes importance of monitoring SPO2 with pulse oximeter and return demonstration;Maintenance of O2 saturations>88%;Exhibits proper breathing techniques, such as pursed lip breathing or other method taught during program session;Compliance with respiratory medication;Demonstrates proper use of MDI's    Comments  Andon is trying to use his CPAP machine at home but does not use it as perscribed. He uses it for a few hours each night and then takes it off. He sleeps on an incline to help him breath better at night. He does use PBL during the day and states that his SOB seems to be under control during ADL's.     Goals/Expected Outcomes  Short: state using CPAP as perscribed. Long: continue to improve shortness of breath as he continues to use breathing techniques and complience with perscribed medications        Initial Exercise Prescription: Initial Exercise Prescription - 12/21/17 1500      Date of Initial Exercise RX and Referring Provider   Date  12/21/17    Referring Provider  [facility]len[facility]      Treadmill   MPH  2.4    Grade  0.5    Minutes  15    METs  3      NuStep   Level  4    SPM  80    Minutes  15    METs  3      Recumbant Elliptical   Level  2    RPM  50    Minutes  15    METs  3      Prescription Details   Frequency (times per week)  3    Duration  Progress to 45  minutes of aerobic exercise without signs/symptoms of physical distress      Intensity   THRR 40-80% of Max Heartrate  115-140    Ratings of Perceived Exertion  11-13    Perceived Dyspnea  0-4      Progression   Progression  Continue to progress workloads to maintain intensity without signs/symptoms of physical distress.      Resistance Training   Training Prescription  Yes    Weight  4 lbs    Reps  10-15       Perform Capillary Blood Glucose checks as needed.  Exercise Prescription Changes: Exercise Prescription Changes    Row Name 12/21/17 1500 12/29/17 0700 01/12/18 1200 01/25/18 1400 02/07/18 1600     Response to Exercise   Blood Pressure (Admit)  132/74  138/68  122/62  122/70  112/60   Blood Pressure (Exercise)  142/72  138/70  -  -  -   Blood Pressure (Exit)  136/76  126/60  114/62  112/64  116/70   Heart Rate (Admit)  90 bpm  99 bpm  101 bpm  112 bpm  97 bpm   Heart Rate (Exercise)  138 bpm  133 bpm  136 bpm  132 bpm  142 bpm   Heart Rate (Exit)  105 bpm  100 bpm  107 bpm  92 bpm  96 bpm   Oxygen Saturation (Admit)  100 %  99 %  100 %  100 %  99 %   Oxygen Saturation (Exercise)  99 %  97 %  99 %  94 %  95 %   Oxygen Saturation (Exit)  100 %  99 %  99 %  98 %  98 %   Rating of Perceived Exertion (Exercise)  11  13  11  13  13    Perceived Dyspnea (Exercise)  2  2  1  2  1    Symptoms  none  none  none  none  none   Comments  walk test results  first full day of exercise  -  -  -   Duration  -  Continue with 45 min of aerobic exercise without signs/symptoms of physical distress.  Continue with 45 min of aerobic exercise without signs/symptoms of physical distress.  Continue with 45 min of aerobic exercise without signs/symptoms of physical distress.  Continue with 45 min of aerobic exercise without signs/symptoms of physical distress.   Intensity  -  THRR unchanged  THRR unchanged  THRR unchanged  THRR unchanged     Progression   Progression  -  Continue to progress  workloads to maintain intensity without signs/symptoms of physical distress.  Continue to progress workloads to maintain intensity without signs/symptoms of physical distress.  Continue to progress workloads to maintain intensity without signs/symptoms of physical distress.  Continue to progress workloads to maintain intensity without signs/symptoms of physical distress.   Average METs  -  2.2  3.17  3.32  3.31     Resistance Training   Training Prescription  -  Yes  Yes  Yes  Yes   Weight  -  4 lbs  4 lbs  4 lbs  5 lbs   Reps  -  10-15  10-15  10-15  10-15     Interval Training   Interval Training  -  No  No  No  No     Treadmill   MPH  -  2.4  2.4  2.5  2.7   Grade  -  0.5  0.5  1  1.5   Minutes  -  15  15  15  15    METs  -  3  3  3.26  3.63     NuStep   Level  -  4  4  4  4    SPM  -  59  99  100  100   Minutes  -  15  15  15  15    METs  -  2.1  4.2  4.4  4.2     Recumbant Elliptical   Level  -  2  2  2  2    RPM  -  33  65  63  61   Minutes  -  15  15  15  15    METs  -  1.5  2.3  2.3  2.1     Home Exercise Plan   Plans to continue exercise at  -  -  Home (comment) walking  Home (comment) walking  Home (comment) walking   Frequency  -  -  Add 1 additional day to program exercise sessions.  Add 1 additional day to program exercise sessions.  Add 1 additional day to  program exercise sessions.   Initial Home Exercises Provided  -  -  01/12/18  01/12/18  01/12/18   Row Name 02/22/18 1400 03/09/18 1400 03/21/18 1600         Response to Exercise   Blood Pressure (Admit)  122/82  128/66  126/68     Blood Pressure (Exit)  110/64  132/70  132/64     Heart Rate (Admit)  86 bpm  99 bpm  96 bpm     Heart Rate (Exercise)  116 bpm  121 bpm  134 bpm     Heart Rate (Exit)  86 bpm  97 bpm  94 bpm     Oxygen Saturation (Admit)  100 %  98 %  100 %     Oxygen Saturation (Exercise)  95 %  94 %  100 %     Oxygen Saturation (Exit)  97 %  98 %  98 %     Rating of Perceived Exertion (Exercise)   13  13  13      Perceived Dyspnea (Exercise)  1  1  1      Symptoms  none  none  none     Duration  Continue with 45 min of aerobic exercise without signs/symptoms of physical distress.  Continue with 45 min of aerobic exercise without signs/symptoms of physical distress.  Continue with 45 min of aerobic exercise without signs/symptoms of physical distress.     Intensity  THRR unchanged  THRR unchanged  THRR unchanged       Progression   Progression  Continue to progress workloads to maintain intensity without signs/symptoms of physical distress.  Continue to progress workloads to maintain intensity without signs/symptoms of physical distress.  Continue to progress workloads to maintain intensity without signs/symptoms of physical distress.     Average METs  3.08  3.44  3.54       Resistance Training   Training Prescription  Yes  Yes  Yes     Weight  5 lbs  10 lbs  8 lbs     Reps  10-15  10-15  10-15       Interval Training   Interval Training  No  No  -       Treadmill   MPH  2.7  3  3      Grade  1.5  1.5  1.5     Minutes  15  15  15      METs  3.63  3.9  3.92       NuStep   Level  5  5  5      SPM  82  80  86     Minutes  15  15  15      METs  3.5  3.6  4.1       Recumbant Elliptical   Level  4  4  4      RPM  42  51  54     Minutes  15  15  15      METs  2.1  2.8  2.6       Home Exercise Plan   Plans to continue exercise at  Home (comment) walking  Home (comment) walking  Home (comment) walking     Frequency  Add 2 additional days to program exercise sessions.  Add 2 additional days to program exercise sessions.  Add 2 additional days to program exercise sessions.     Initial Home Exercises Provided  01/12/18  01/12/18  01/12/18        Exercise Comments: Exercise Comments    Row Name 12/24/17 1136 03/30/18 1120         Exercise Comments  First full day of exercise!  Patient was oriented to gym and equipment including functions, settings, policies, and procedures.   Patient's individual exercise prescription and treatment plan were reviewed.  All starting workloads were established based on the results of the 6 minute walk test done at initial orientation visit.  The plan for exercise progression was also introduced and progression will be customized based on patient's performance and goals.  Tyreece graduated today from  rehab with 36 sessions completed.  Details of the patient's exercise prescription and what He needs to do in order to continue the prescription and progress were discussed with patient.  Patient was given a copy of prescription and goals.  Patient verbalized understanding.  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         Exercise Goals and Review: Exercise Goals    Row Name 12/21/17 1558             Exercise Goals   Increase Physical Activity  Yes       Intervention  Provide advice, education, support and counseling about physical activity/exercise needs.;Develop an individualized exercise prescription for aerobic and resistive training based on initial evaluation findings, risk stratification, comorbidities and participant's personal goals.       Expected Outcomes  Achievement of increased cardiorespiratory fitness and enhanced flexibility, muscular endurance and strength shown through measurements of functional capacity and personal statement of participant.       Increase Strength and Stamina  Yes       Intervention  Provide advice, education, support and counseling about physical activity/exercise needs.;Develop an individualized exercise prescription for aerobic and resistive training based on initial evaluation findings, risk stratification, comorbidities and participant's personal goals.       Expected Outcomes  Achievement of increased cardiorespiratory fitness and enhanced flexibility, muscular endurance and strength shown through measurements of functional capacity and personal statement of  participant.       Able to understand and use rate of perceived exertion (RPE) scale  Yes       Intervention  Provide education and explanation on how to use RPE scale       Expected Outcomes  Short Term: Able to use RPE daily in rehab to express subjective intensity level;Long Term:  Able to use RPE to guide intensity level when exercising independently       Able to understand and use Dyspnea scale  Yes       Intervention  Provide education and explanation on how to use Dyspnea scale       Expected Outcomes  Short Term: Able to use Dyspnea scale daily in rehab to express subjective sense of shortness of breath during exertion;Long Term: Able to use Dyspnea scale to guide intensity level when exercising independently       Knowledge and understanding of Target Heart Rate Range (THRR)  Yes       Intervention  Provide education and explanation of THRR including how the numbers were predicted and where they are located for reference       Expected Outcomes  Short Term: Able to state/look up THRR;Long Term: Able to use THRR to govern intensity when exercising independently;Short Term: Able to use daily as guideline for intensity in rehab  Able to check pulse independently  Yes       Intervention  Provide education and demonstration on how to check pulse in carotid and radial arteries.;Review the importance of being able to check your own pulse for safety during independent exercise       Expected Outcomes  Short Term: Able to explain why pulse checking is important during independent exercise;Long Term: Able to check pulse independently and accurately       Understanding of Exercise Prescription  Yes       Intervention  Provide education, explanation, and written materials on patient's individual exercise prescription       Expected Outcomes  Short Term: Able to explain program exercise prescription;Long Term: Able to explain home exercise prescription to exercise independently          Exercise  Goals Re-Evaluation : Exercise Goals Re-Evaluation    Row Name 12/24/17 1137 12/29/17 0752 01/12/18 1228 01/25/18 1433 02/07/18 1627     Exercise Goal Re-Evaluation   Exercise Goals Review  Able to understand and use Dyspnea scale;Understanding of Exercise Prescription;Knowledge and understanding of Target Heart Rate Range (THRR);Able to understand and use rate of perceived exertion (RPE) scale  Increase Physical Activity;Increase Strength and Stamina;Understanding of Exercise Prescription  Increase Physical Activity;Able to understand and use Dyspnea scale;Understanding of Exercise Prescription;Increase Strength and Stamina;Knowledge and understanding of Target Heart Rate Range (THRR);Able to understand and use rate of perceived exertion (RPE) scale;Able to check pulse independently  Increase Physical Activity;Understanding of Exercise Prescription;Increase Strength and Stamina  Increase Physical Activity;Understanding of Exercise Prescription;Increase Strength and Stamina   Comments  Reviewed RPE scale, THR and program prescription with pt today.  Pt voiced understanding and was given a copy of goals to take home.   Patrick Jupiter has completed his first full day of exercise.  He was able to do the full 45 min with minimal rest between stations.  We will continue to monitor his progression.   Patrick Jupiter is off to a good start in rehab.  He has already noticed that his strength and stamina are starting to get better.  He is enjoying coming to exercise.  Currently, he is not doing home exericse, but has been able to increase his activity levels at home.  Reviewed home exercise with pt today.  Pt plans to walk at home for exercise.  Reviewed THR, pulse, RPE, sign and symptoms, and when to call 911 or MD.  Also discussed weather considerations and indoor options.  Pt voiced understanding.  Patrick Jupiter has been doing well in rehab.  He has worked his way up to 4.4 METs on the NuStep.  We will work on increasing workloads especially  on recumbent elliptical.  We will continue to monitor his progression.   Patrick Jupiter continues to do well in rehab. He is now up to 2.7 mph on the treadmill.  We will continue to monitor his progress.    Expected Outcomes  Short: Use RPE daily to regulate intensity.  Long: Follow program prescription in THR.  Short: Continue to attend [facility] regularly.  Long: Continue to follow program prescription.   Short: Add in at least one extra day a week at home for exercise.  Long: Continue to increase physcial activity.   Short: Increase workload on REL and speed on treadmill. Long: Continue to exercise more at home.   Short: Increase workload on REL.  Long: Continue to exercise independently   Bonner Name 02/22/18 1443 03/09/18 1233 03/21/18 1618  Exercise Goal Re-Evaluation   Exercise Goals Review  Increase Physical Activity;Understanding of Exercise Prescription;Increase Strength and Stamina  Increase Physical Activity;Increase Strength and Stamina  Increase Physical Activity;Increase Strength and Stamina;Understanding of Exercise Prescription     Comments  Patrick Jupiter has been doing well in rehab.   He is now up to level 4 on the REL and level 5 on the NuStep.  We will continue to monitor his progression.   Patrick Jupiter has been doing yard work and trying to walk [employer] he can. He is not interested in joining a gym after the program is done.  Patrick Jupiter continues to do well in rehab.  He is nearing graduation!  He is up to 3.0 mph and 1.5% grade on treadmill.  He is now up to 2.6 METs on recumbent ellipitcal.  We will continue to monitor his progression.     Expected Outcomes  Short: Continue to work on increasing workloads, encourage an increase in weights.  Long: Continue to exercise at home on off days.   Short: stay active whenever possible. Long: maintain exercise at home independently  Short: Graduation!!  Long: Continue to exercise more at home.         Discharge Exercise Prescription (Final Exercise Prescription  Changes): Exercise Prescription Changes - 03/21/18 1600      Response to Exercise   Blood Pressure (Admit)  126/68    Blood Pressure (Exit)  132/64    Heart Rate (Admit)  96 bpm    Heart Rate (Exercise)  134 bpm    Heart Rate (Exit)  94 bpm    Oxygen Saturation (Admit)  100 %    Oxygen Saturation (Exercise)  100 %    Oxygen Saturation (Exit)  98 %    Rating of Perceived Exertion (Exercise)  13    Perceived Dyspnea (Exercise)  1    Symptoms  none    Duration  Continue with 45 min of aerobic exercise without signs/symptoms of physical distress.    Intensity  THRR unchanged      Progression   Progression  Continue to progress workloads to maintain intensity without signs/symptoms of physical distress.    Average METs  3.54      Resistance Training   Training Prescription  Yes    Weight  8 lbs    Reps  10-15      Treadmill   MPH  3    Grade  1.5    Minutes  15    METs  3.92      NuStep   Level  5    SPM  86    Minutes  15    METs  4.1      Recumbant Elliptical   Level  4    RPM  54    Minutes  15    METs  2.6      Home Exercise Plan   Plans to continue exercise at  Home (comment) walking    Frequency  Add 2 additional days to program exercise sessions.    Initial Home Exercises Provided  01/12/18       Nutrition:  Target Goals: Understanding of nutrition guidelines, daily intake of sodium <1528m, cholesterol <2056m calories 30% from fat and 7% or less from saturated fats, daily to have 5 or more servings of fruits and vegetables.  Biometrics: Pre Biometrics - 12/21/17 1600      Pre Biometrics   Height  6' 1.2" (1.859 m)    Weight  180 lb 12.8 oz (82 kg)    Waist Circumference  39.5 inches    Hip Circumference  39 inches    Waist to Hip Ratio  1.01 %    BMI (Calculated)  23.73      Post Biometrics - 03/25/18 1222       Post  Biometrics   Height  6' 1.2" (1.859 m)    Weight  190 lb (86.2 kg)    Waist Circumference  38 inches    Hip Circumference   38 inches    Waist to Hip Ratio  1 %    BMI (Calculated)  24.94       Nutrition Therapy Plan and Nutrition Goals: Nutrition Therapy & Goals - 01/12/18 1511      Personal Nutrition Goals   Nutrition Goal  Plan to eat a vegetable and/or a fruit with each meal. If canned, choose low-sodium vegetables and low-sugar fruits    Personal Goal #2  Switch to using 2% milk instead of whole milk.    Personal Goal #3  Decrease sugar from drinks -- this might help with appetite for solid foods. Try diet Dr. Malachi Bonds, and can try decreasing sugar used in tea gradually    Comments  Mr. Vana reports improving appetite, but not up to normal yet. Discussed some strategies to help appetite as well as breathing. Encouraged him to eat foods that taste good to him, but make some healthy changes as able.        Nutrition Assessments: Nutrition Assessments - 03/23/18 1246      MEDFICTS Scores   Post Score  56       Nutrition Goals Re-Evaluation: Nutrition Goals Re-Evaluation    Row Name 01/17/18 1718 02/14/18 1205 03/09/18 1225 03/28/18 1206       Goals   Current Weight  180 lb (81.6 kg)  181 lb (82.1 kg)  184 lb (83.5 kg)  189 lb (85.7 kg)    Nutrition Goal  Eat less sugar and eat more vegetables.  Eat more vegetables and less sugary foods. Be mindful of cholestrol foods  Stanford wants to be around the 180-109lbs for his weight.  -    Comment  Lonnel has met with the dietician and has gave him some good insight on the things he should be eating.  Donovon still has the suggestions from the dietician, it is hard for him to make the changes. He has cut down on his Dr. Malachi Bonds consumption and eats less hot dogs.   Chief states that he is eating what he wants in moderation. He does not seemed concerned with changing his diet.  Sundeep states that he is eating what he wants in moderation. He does not seemed concerned with changing his diet. Rasheem is maintining his weight between 180-190 lbs.     Expected Outcome   Short: decrease sugar intake from drinks. Long: try to drink diet sodas or maintain cutting out sugary drinks.  Short. decrease sugar intake and high cholesterol foods. Long: apply all changes recommended by dietician.   Short: maintain weight. Long continue to exercise indeoendently to maintain weight.  Short: maintain weight. Long continue to exercise indeoendently to maintain weight.      Personal Goal #2 Re-Evaluation   Personal Goal #2  -  -  -  Luby stated he is still drinking whole milk but only drinks about 1 glass a day.       Personal Goal #3 Re-Evaluation   Personal Goal #  3  -  -  -  Cornelious has decreased the number of Dr. Samson Frederic he is drinking each day, down to 2 from 3-4 a day.        Nutrition Goals Discharge (Final Nutrition Goals Re-Evaluation): Nutrition Goals Re-Evaluation - 03/28/18 1206      Goals   Current Weight  189 lb (85.7 kg)    Comment  Silvester states that he is eating what he wants in moderation. He does not seemed concerned with changing his diet. Omario is maintining his weight between 180-190 lbs.     Expected Outcome  Short: maintain weight. Long continue to exercise indeoendently to maintain weight.      Personal Goal #2 Re-Evaluation   Personal Goal #2  Gaynor stated he is still drinking whole milk but only drinks about 1 glass a day.       Personal Goal #3 Re-Evaluation   Personal Goal #3  Abhishek has decreased the number of Dr. Samson Frederic he is drinking each day, down to 2 from 3-4 a day.        Psychosocial: Target Goals: Acknowledge presence or absence of significant depression and/or stress, maximize coping skills, provide positive support system. Participant is able to verbalize types and ability to use techniques and skills needed for reducing stress and depression.   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 He is really tired and sometimes he cannot get get out of bed. His COPD does not get to him unless it flares up.      Family Dynamics   Good Support System?  Yes    Comments  His daughter and his wife are good support system.      Barriers   Psychosocial barriers to participate in program  The patient should benefit from training in stress management and relaxation.      Screening Interventions   Interventions  Yes;Program counselor consult;Provide feedback about the scores to participant;Encouraged to exercise;To provide support and resources with identified psychosocial needs    Expected Outcomes  Short Term goal: Utilizing psychosocial counselor, staff and physician to assist with identification of specific Stressors or current issues interfering with healing process. Setting desired goal for each stressor or current issue identified.;Long Term Goal: Stressors or current issues are controlled or eliminated.;Long Term goal: The participant improves quality of Life and PHQ9 Scores as seen by post scores and/or verbalization of changes;Short Term goal: Identification and review with participant of any Quality of Life or Depression concerns found by scoring the questionnaire.       Quality of Life Scores:  Scores of 19 and below usually indicate a poorer quality of life in these areas.  A difference of  2-3 points is a clinically meaningful difference.  A difference of 2-3 points in the total score of the Quality of Life Index has been associated with significant improvement in overall quality of life, self-image, physical symptoms, and general health in studies assessing change in quality of life.  PHQ-9: Recent Review Flowsheet Data    Depression screen [facility] 2/9 03/23/2018 01/31/2018 12/21/2017    Decreased Interest 1 1 2    Down, Depressed, Hopeless 1 1 1  PHQ - 2 Score 2 2 3    Altered sleeping 2 2 3    Tired, decreased energy 2 2 2    Change in appetite 1 1 0   Feeling bad or failure about yourself  0 2 1   Trouble concentrating 1 1 2    Moving slowly or fidgety/restless 0 0 0   Suicidal thoughts 1  1  0   PHQ-9 Score 9 11 11    Difficult doing work/chores Not difficult at all Somewhat difficult Somewhat difficult     Interpretation of Total Score  Total Score Depression Severity:  1-4 = Minimal depression, 5-9 = Mild depression, 10-14 = Moderate depression, 15-19 = Moderately severe depression, 20-27 = Severe depression   Psychosocial Evaluation and Intervention: Psychosocial Evaluation - 01/24/18 1219      Psychosocial Evaluation & Interventions   Interventions  Encouraged to exercise with the program and follow exercise prescription;Stress management education;Relaxation education    Comments  Counselor met with Mr. Fournet Mester) today for initial psychosocial evaluation.  He is a 67 year old who has been diagnosed with COPD.  Treyvin has a great support system as a newlywed of 2 months.  He has multiple health issues having battled lung cancer several years ago and bladder cancer just recently.  He has chronic sleep problems with only 4-5 hours/night typically and has a goal to get at least 6.  He has a good appetite and is generally in a positive mood.  Cashmere denies a history of depression or anxiety or any current symptoms.  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  He has goals to breathe better and increase his energy.  Staff will follow with Anders throughout the course of this program.      Expected Outcomes  Leor will benefit from consistent exercise to achieve his stated goals.  The educational and psychoeducational components of this program will be helpful in understanding and coping more positively.       Continue Psychosocial Services   Follow up required by staff       Psychosocial Re-Evaluation: Psychosocial Re-Evaluation    Slater Name 01/17/18 1721 01/31/18 1228 03/09/18 1223 03/28/18 1219       Psychosocial Re-Evaluation   Current issues with  Current Stress Concerns;Current Depression;Current Sleep Concerns  Current Stress Concerns;Current Depression;Current Sleep Concerns  Current Stress Concerns;Current Depression;Current Sleep Concerns  Current Stress Concerns;Current Depression;Current Sleep Concerns    Comments  Danniel states that his stress levels have not changed too much since the start of the program. He is coming to the program regularly and has been improving. He always has a good attitude and even jokes a little in class.  Linzie is having a hard time with his VA check. He states they are trying to go from 60 percent to 0 percent. He relies on that income to live and he is trying to fight for his check. He states that his bladder cancer diagnosis evidently is not a valid diagnosis for his check. He is dong all he can at the  moment and will know what the outcome will be in about 60 days.  Pinchus still does not have answers on his income. That is the most of his stress concerns at the moment. He says he has been sleeping better but has not made any changes attributing to that.  Dj has faxed the information needed to the [place] to  work on his income situation but is still waiting to hear back from them as to his status. He is sleeping better then he used to but about the same as the last review and reported he is able to do more around the house.     Expected Outcomes  Short: attend [facility] to reduce stress. Long: maintain an exercise routine to decrease or eliminate stress.  Short: fight for his VA check. Long: maintain a positive outlook no matter the outcome of his VA check.  Short: get information on his income check. Long: maintain a positive outlook no matter the outcome of his  income.  Short: get information on his income check. Long: maintain a positive outlook no matter the outcome of his income.    Interventions  Encouraged to attend [facility] for the exercise  Encouraged to attend [facility] for the exercise  Encouraged to attend [facility] for the exercise  Encouraged to attend [facility] for the exercise    Continue Psychosocial Services   Follow up required by staff  Follow up required by staff  Follow up required by staff  Follow up required by staff       Psychosocial Discharge (Final Psychosocial Re-Evaluation): Psychosocial Re-Evaluation - 03/28/18 1219      Psychosocial Re-Evaluation   Current issues with  Current Stress Concerns;Current Depression;Current Sleep Concerns    Comments  Kermitt has faxed the information needed to the [place] to work on his income situation but is still waiting to hear back from them as to his status. He is sleeping better then he used to but about the same as the last review and reported he is able to do more around the house.     Expected Outcomes  Short: get information on his income check. Long: maintain a positive outlook no matter the outcome of his income.    Interventions  Encouraged to attend [facility] for the exercise    Continue Psychosocial Services   Follow up required by staff       Education: Education Goals: Education classes will be provided on a weekly basis, covering required topics. Participant will state understanding/return demonstration of topics presented.  Learning Barriers/Preferences: Learning Barriers/Preferences - 12/21/17 1444      Learning Barriers/Preferences   Learning Barriers  Sight glasses    Learning Preferences  None       Education Topics:  Initial Evaluation Education: - Verbal, written and demonstration of respiratory meds, oximetry and breathing techniques. Instruction on use of nebulizers and MDIs and  importance of monitoring MDI activations.   Pulmonary Rehab from 03/30/2018 in [facility] Cardiac and Pulmonary Rehab  Date  12/21/17  Educator  [facility]  Instruction Review Code  1- Verbalizes Understanding      General Nutrition Guidelines/Fats and Fiber: -Group instruction provided by verbal, written material, models and posters to present the general guidelines for heart healthy nutrition. Gives an explanation and review of dietary fats and fiber.   Pulmonary Rehab from 03/30/2018 in [facility] Cardiac and Pulmonary Rehab  Date  01/10/18  Educator  CR  Instruction Review Code  1- Verbalizes Understanding      Controlling Sodium/Reading Food Labels: -Group verbal and written material supporting the discussion of sodium use in heart healthy nutrition. Review and explanation with models, verbal and written materials for utilization of the food label.   Pulmonary Rehab from 03/30/2018 in [facility] Cardiac and Pulmonary Rehab  Date  03/14/18  Educator  CR  Instruction Review  Code  1- Verbalizes Understanding      Exercise Physiology & General Exercise Guidelines: - Group verbal and written instruction with models to review the exercise physiology of the cardiovascular system and associated critical values. Provides general exercise guidelines with specific guidelines to those with heart or lung disease.    Pulmonary Rehab from 03/30/2018 in [facility] Cardiac and Pulmonary Rehab  Date  03/23/18  Educator  [facility]  Instruction Review Code  1- Verbalizes Understanding      Aerobic Exercise & Resistance Training: - Gives group verbal and written instruction on the various components of exercise. Focuses on aerobic and resistive training programs and the benefits of this training and how to safely progress through these programs.   Pulmonary Rehab from 03/30/2018 in [facility] Cardiac and Pulmonary Rehab  Date  12/31/17  Educator  [facility]  Instruction Review Code  1- Verbalizes Understanding      Flexibility, Balance,  Mind/Body Relaxation: Provides group verbal/written instruction on the benefits of flexibility and balance training, including mind/body exercise modes such as yoga, pilates and tai chi.  Demonstration and skill practice provided.   Pulmonary Rehab from 03/30/2018 in [facility] Cardiac and Pulmonary Rehab  Date  01/26/18  Educator  AS  Instruction Review Code  1- Verbalizes Understanding      Stress and Anxiety: - Provides group verbal and written instruction about the health risks of elevated stress and causes of high stress.  Discuss the correlation between heart/lung disease and anxiety and treatment options. Review healthy ways to manage with stress and anxiety.   Pulmonary Rehab from 03/30/2018 in [facility] Cardiac and Pulmonary Rehab  Date  02/16/18  Educator  [facility]  Instruction Review Code  1- Verbalizes Understanding      Depression: - Provides group verbal and written instruction on the correlation between heart/lung disease and depressed mood, treatment options, and the stigmas associated with seeking treatment.   Pulmonary Rehab from 03/30/2018 in [facility] Cardiac and Pulmonary Rehab  Date  02/02/18  Educator  [facility]  Instruction Review Code  1- Verbalizes Understanding      Exercise & Equipment Safety: - Individual verbal instruction and demonstration of equipment use and safety with use of the equipment.   Pulmonary Rehab from 03/30/2018 in [facility] Cardiac and Pulmonary Rehab  Date  12/21/17  Educator  [facility]  Instruction Review Code  1- Verbalizes Understanding      Infection Prevention: - Provides verbal and written material to individual with discussion of infection control including proper hand washing and proper equipment cleaning during exercise session.   Pulmonary Rehab from 03/30/2018 in [facility] Cardiac and Pulmonary Rehab  Date  12/21/17  Educator  [facility]  Instruction Review Code  1- Verbalizes Understanding      Falls Prevention: - Provides verbal and written material to individual with  discussion of falls prevention and safety.   Pulmonary Rehab from 03/30/2018 in [facility] Cardiac and Pulmonary Rehab  Date  12/21/17  Educator  [facility]  Instruction Review Code  1- Verbalizes Understanding      Diabetes: - Individual verbal and written instruction to review signs/symptoms of diabetes, desired ranges of glucose level fasting, after meals and with exercise. Advice that pre and post exercise glucose checks will be done for 3 sessions at entry of program.   Chronic Lung Diseases: - Group verbal and written instruction to review updates, respiratory medications, advancements in procedures and treatments. Discuss use of supplemental oxygen including available portable oxygen systems, continuous and intermittent flow  rates, concentrators, personal use and safety guidelines. Review proper use of inhaler and spacers. Provide informative websites for self-education.    Pulmonary Rehab from 03/30/2018 in [facility] Cardiac and Pulmonary Rehab  Date  03/09/18  Educator  [facility]  Instruction Review Code  1- Verbalizes Understanding      Energy Conservation: - Provide group verbal and written instruction for methods to conserve energy, plan and organize activities. Instruct on pacing techniques, use of adaptive equipment and posture/positioning to relieve shortness of breath.   Pulmonary Rehab from 03/30/2018 in [facility] Cardiac and Pulmonary Rehab  Date  02/09/18  Educator  [facility]  Instruction Review Code  1- Verbalizes Understanding      Triggers and Exacerbations: - Group verbal and written instruction to review types of environmental triggers and ways to prevent exacerbations. Discuss weather changes, air quality and the benefits of nasal washing. Review warning signs and symptoms to help prevent infections. Discuss techniques for effective airway clearance, coughing, and vibrations.   AED/CPR: - Group verbal and written instruction with the use of models to demonstrate the basic use of the AED with the  basic ABC's of resuscitation.   Pulmonary Rehab from 03/30/2018 in [facility] Cardiac and Pulmonary Rehab  Date  02/11/18  Educator  [facility]  Instruction Review Code  1- Actuary and Physiology of the Lungs: - Group verbal and written instruction with the use of models to provide basic lung anatomy and physiology related to function, structure and complications of lung disease.   Anatomy & Physiology of the Heart: - Group verbal and written instruction and models provide basic cardiac anatomy and physiology, with the coronary electrical and arterial systems. Review of Valvular disease and Heart Failure   Pulmonary Rehab from 03/30/2018 in [facility] Cardiac and Pulmonary Rehab  Date  01/12/18  Educator  [facility]  Instruction Review Code  1- Verbalizes Understanding      Cardiac Medications: - Group verbal and written instruction to review commonly prescribed medications for heart disease. Reviews the medication, class of the drug, and side effects.   Pulmonary Rehab from 03/30/2018 in [facility] Cardiac and Pulmonary Rehab  Date  01/28/18  Educator  [facility]  Instruction Review Code  1- Verbalizes Understanding      Know Your Numbers and Risk Factors: -Group verbal and written instruction about important numbers in your health.  Discussion of what are risk factors and how they play a role in the disease process.  Review of Cholesterol, Blood Pressure, Diabetes, and BMI and the role they play in your overall health.   Pulmonary Rehab from 03/30/2018 in [facility] Cardiac and Pulmonary Rehab  Date  03/25/18  Educator  [facility]  Instruction Review Code  1- Verbalizes Understanding      Sleep Hygiene: -Provides group verbal and written instruction about how sleep can affect your health.  Define sleep hygiene, discuss sleep cycles and impact of sleep habits. Review good sleep hygiene tips.    Pulmonary Rehab from 03/30/2018 in [facility] Cardiac and Pulmonary Rehab  Date  03/30/18  Educator  [facility]    Instruction Review Code  1- Verbalizes Understanding      Other: -Provides group and verbal instruction on various topics (see comments)   Pulmonary Rehab from 03/30/2018 in [facility] Cardiac and Pulmonary Rehab  Date  01/05/18  Educator  [facility]  Instruction Review Code  1- Verbalizes Understanding [SLEEP]       Knowledge Questionnaire Score: Knowledge Questionnaire Score - 03/23/18 1245  Knowledge Questionnaire Score   Pre Score  15/18    Post Score  18/18 reviewed with patient        Core Components/Risk Factors/Patient Goals at Admission: Personal Goals and Risk Factors at Admission - 12/21/17 1449      Core Components/Risk Factors/Patient Goals on Admission    Weight Management  Yes;Weight Maintenance    Intervention  Weight Management: Develop a combined nutrition and exercise program designed to reach desired caloric intake, while maintaining appropriate intake of nutrient and fiber, sodium and fats, and appropriate energy expenditure required for the weight goal.;Weight Management: Provide education and appropriate resources to help participant work on and attain dietary goals.;Weight Management/Obesity: Establish reasonable short term and long term weight goals.    Admit Weight  180 lb 12.8 oz (82 kg)    Goal Weight: Short Term  175 lb (79.4 kg)    Goal Weight: Long Term  180 lb (81.6 kg)    Expected Outcomes  Short Term: Continue to assess and modify interventions until short term weight is achieved;Long Term: Adherence to nutrition and physical activity/exercise program aimed toward attainment of established weight goal;Weight Maintenance: Understanding of the daily nutrition guidelines, which includes 25-35% calories from fat, 7% or less cal from saturated fats, less than 274m cholesterol, less than 1.5gm of sodium, & 5 or more servings of fruits and vegetables daily;Understanding recommendations for meals to include 15-35% energy as protein, 25-35% energy from fat, 35-60%  energy from carbohydrates, less than 201mof dietary cholesterol, 20-35 gm of total fiber daily;Understanding of distribution of calorie intake throughout the day with the consumption of 4-5 meals/snacks    Improve shortness of breath with ADL's  Yes    Intervention  Provide education, individualized exercise plan and daily activity instruction to help decrease symptoms of SOB with activities of daily living.    Expected Outcomes  Short Term: Achieves a reduction of symptoms when performing activities of daily living.    Lipids  Yes    Intervention  Provide education and support for participant on nutrition & aerobic/resistive exercise along with prescribed medications to achieve LDL <7049mHDL >19m30m  Expected Outcomes  Short Term: Participant states understanding of desired cholesterol values and is compliant with medications prescribed. Participant is following exercise prescription and nutrition guidelines.;Long Term: Cholesterol controlled with medications as prescribed, with individualized exercise RX and with personalized nutrition plan. Value goals: LDL < 70mg2mL > 40 mg.       Core Components/Risk Factors/Patient Goals Review:  Goals and Risk Factor Review    Row Name 01/17/18 1714 02/14/18 1201 03/09/18 1237 03/28/18 1202       Core Components/Risk Factors/Patient Goals Review   Personal Goals Review  Weight Management/Obesity;Improve shortness of breath with ADL's;Lipids  Weight Management/Obesity;Improve shortness of breath with ADL's;Lipids  Weight Management/Obesity;Improve shortness of breath with ADL's;Lipids  Weight Management/Obesity;Improve shortness of breath with ADL's;Lipids    Review  [facility] [facility] levels on all exercises. His shortness of breath has got a little better doing things around the house. His blood pressure has been within normal limits.  GarveValeriabeen continually improving with exercise. He continues to  increase is levels on all of his equipment. He reports a one pound gain in the last month, but he is happy with his current weight. He has noticed a change in his breathing throughout the program. He is taking his cholesterol medicine and attempting to watch  what he is eating  Kairi has been feeling well at home and does not get short of breath when doing his dailey activities. He is comfortable with his weight. He is not sure what his cholestorol is but knows he has been tested somewhat recently.  Raquan stated that he does not know specificially what his cholestorol numbers are but that he recently had lab work and was told everything was within good ranges. He currently takes his cholesterol meds as perscribed. He is still comfortable with maintaining his current weight.     Expected Outcomes  Short: attend [facility] regularly. Long: maintain exercise after [facility].  Short: continue to attend [facility] and challenge himself with exercise. Long: apply lifestyle changes after graduation, apply lipid control changes.   Short: Learn what his Cholestorol levels are. Long: maintain medication and lipid levels.  Short: Continue taking cholestorol medications. Long: Maintain weight and lipid levels.        Core Components/Risk Factors/Patient Goals at Discharge (Final Review):  Goals and Risk Factor Review - 03/28/18 1202      Core Components/Risk Factors/Patient Goals Review   Personal Goals Review  Weight Management/Obesity;Improve shortness of breath with ADL's;Lipids    Review  Hyder stated that he does not know specificially what his cholestorol numbers are but that he recently had lab work and was told everything was within good ranges. He currently takes his cholesterol meds as perscribed. He is still comfortable with maintaining his current weight.     Expected Outcomes  Short: Continue taking cholestorol medications. Long: Maintain weight and lipid levels.        ITP Comments: ITP Comments      Row Name 12/21/17 1420 12/27/17 0836 12/31/17 1246 01/24/18 0937 01/24/18 1226   ITP Comments  Medical Evaluation completed. Chart sent for review and changes to Dr. Emily Filbert Director of [place]. Diagnosis can be found in [facility] encounter 12/21/17  30 day review completed. ITP sent to Dr. Emily Filbert Director of [place]. Continue with ITP unless changes are made by physician.  Mort has informed us that he has a small aortic aneurysm that the doctors are going to keep an eye on. He states that he is not restricted from [place].  30 day review completed. ITP sent to Dr. Emily Filbert Director of [place]. Continue with ITP unless changes are made by physician.  Nicco states that his doctor checked his abdominal aneurysm and no changes have occured and it is ok to continue with exercise.   Bono Name 02/21/18 0839 03/21/18 0902 03/30/18 1121       ITP Comments  30 day review completed. ITP sent to Dr. Emily Filbert Director of [place]. Continue with ITP unless changes are made by physician.   30 day review completed. ITP sent to Dr. Emily Filbert Director of [place]. Continue with ITP unless changes are made by physician  Discharge ITP sent and signed by Dr. Sabra Heck.  Discharge Summary routed to PCP and Pulmonologist        Comments: Discharge ITP

## 2018-03-30 NOTE — Progress Notes (Signed)
Daily Session Note  Patient Details  Name: Samuel Moore MRN: [id number] Date of Birth: 04-15-1951 Referring Provider:     Pulmonary Rehab from 12/21/2017 in [facility] Cardiac and Pulmonary Rehab  Referring Provider  Samuel Rota MD [[place]]      Encounter Date: 03/30/2018  Check In: Session Check In - 03/30/18 1118      Check-In   Location  [facility]-Cardiac & Pulmonary Rehab    Staff Present  Justin Mend RCP,RRT,BSRT;Meredith Sherryll Burger, RN BSN;Jessica Luan Pulling, MA, RCEP, CCRP, Exercise Physiologist    Supervising physician immediately available to respond to emergencies  [facility] immediately available ER MD    Physician(s)  Dr. Owens Shark and Jimmye Norman    Medication changes reported      No    Fall or balance concerns reported     No    Tobacco Cessation  No Change    Warm-up and Cool-down  Performed as group-led instruction    Resistance Training Performed  Yes    VAD Patient?  No      Pain Assessment   Currently in Pain?  No/denies          Social History   Tobacco Use  Smoking Status Former Smoker  . Packs/day: 2.00  . Years: 10.00  . Pack years: 20.00  . Types: Cigarettes  . Last attempt to quit: 12/14/2002  . Years since quitting: 15.3  Smokeless Tobacco Never Used    Goals Met:  Independence with exercise equipment Exercise tolerated well No report of cardiac concerns or symptoms Strength training completed today  Goals Unmet:  Not Applicable  Comments:  Samuel Moore graduated today from  rehab with 36 sessions completed.  Details of the patient's exercise prescription and what He needs to do in order to continue the prescription and progress were discussed with patient.  Patient was given a copy of prescription and goals.  Patient verbalized understanding.  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   Dr. Emily Filbert is Medical Director for [place] and [facility].

## 2019-09-18 ENCOUNTER — Emergency Department: Payer: No Typology Code available for payment source

## 2019-09-18 ENCOUNTER — Emergency Department
Admission: EM | Admit: 2019-09-18 | Discharge: 2019-09-18 | Disposition: A | Discharge status: Home / Self Care | Payer: No Typology Code available for payment source | Attending: Emergency Medicine | Admitting: Emergency Medicine

## 2019-09-18 ENCOUNTER — Other Ambulatory Visit: Payer: Self-pay

## 2019-09-18 ENCOUNTER — Encounter: Payer: Self-pay | Admitting: Emergency Medicine

## 2019-09-18 DIAGNOSIS — I10 Essential (primary) hypertension: Secondary | ICD-10-CM | POA: Diagnosis not present

## 2019-09-18 DIAGNOSIS — J449 Chronic obstructive pulmonary disease, unspecified: Secondary | ICD-10-CM | POA: Diagnosis not present

## 2019-09-18 DIAGNOSIS — Z20828 Contact with and (suspected) exposure to other viral communicable diseases: Secondary | ICD-10-CM | POA: Insufficient documentation

## 2019-09-18 DIAGNOSIS — Z87891 Personal history of nicotine dependence: Secondary | ICD-10-CM | POA: Diagnosis not present

## 2019-09-18 DIAGNOSIS — R079 Chest pain, unspecified: Secondary | ICD-10-CM

## 2019-09-18 DIAGNOSIS — Z8546 Personal history of malignant neoplasm of prostate: Secondary | ICD-10-CM | POA: Insufficient documentation

## 2019-09-18 DIAGNOSIS — R0789 Other chest pain: Secondary | ICD-10-CM | POA: Diagnosis present

## 2019-09-18 DIAGNOSIS — J189 Pneumonia, unspecified organism: Secondary | ICD-10-CM | POA: Insufficient documentation

## 2019-09-18 LAB — CBC
HCT: 31.6 % — ABNORMAL LOW (ref 39.0–52.0)
Hemoglobin: 9.7 g/dL — ABNORMAL LOW (ref 13.0–17.0)
MCH: 27.8 pg (ref 26.0–34.0)
MCHC: 30.7 g/dL (ref 30.0–36.0)
MCV: 90.5 fL (ref 80.0–100.0)
Platelets: 119 10*3/uL — ABNORMAL LOW (ref 150–400)
RBC: 3.49 MIL/uL — ABNORMAL LOW (ref 4.22–5.81)
RDW: 16.7 % — ABNORMAL HIGH (ref 11.5–15.5)
WBC: 6.7 10*3/uL (ref 4.0–10.5)
nRBC: 0 % (ref 0.0–0.2)

## 2019-09-18 LAB — PROCALCITONIN: Procalcitonin: <0.1 ng/mL

## 2019-09-18 LAB — SARS CORONAVIRUS 2 BY RT PCR (HOSPITAL ORDER, PERFORMED IN ~~LOC~~ HOSPITAL LAB): SARS Coronavirus 2: NEGATIVE

## 2019-09-18 LAB — BASIC METABOLIC PANEL
Anion gap: 7 (ref 5–15)
BUN: 16 mg/dL (ref 8–23)
CO2: 21 mmol/L — ABNORMAL LOW (ref 22–32)
Calcium: 8 mg/dL — ABNORMAL LOW (ref 8.9–10.3)
Chloride: 108 mmol/L (ref 98–111)
Creatinine, Ser: 1.11 mg/dL (ref 0.61–1.24)
GFR calc Af Amer: >60 mL/min (ref >60)
GFR calc non Af Amer: >60 mL/min (ref >60)
Glucose, Bld: 122 mg/dL — ABNORMAL HIGH (ref 70–99)
Potassium: 3.6 mmol/L (ref 3.5–5.1)
Sodium: 136 mmol/L (ref 135–145)

## 2019-09-18 LAB — TROPONIN I (HIGH SENSITIVITY)
Troponin I (High Sensitivity): 3 ng/L (ref <18)
Troponin I (High Sensitivity): 2 ng/L (ref <18)

## 2019-09-18 LAB — INFLUENZA PANEL BY PCR (TYPE A & B)
Influenza A By PCR: NEGATIVE
Influenza B By PCR: NEGATIVE

## 2019-09-18 IMAGING — DX DG CHEST 1V PORT
1 series · 1 of 1 positions shown · non-contrast
Comparison: [DATE]

CLINICAL DATA: Chest pain, chills, and weakness

EXAM:
PORTABLE CHEST 1 VIEW

[chest ap]
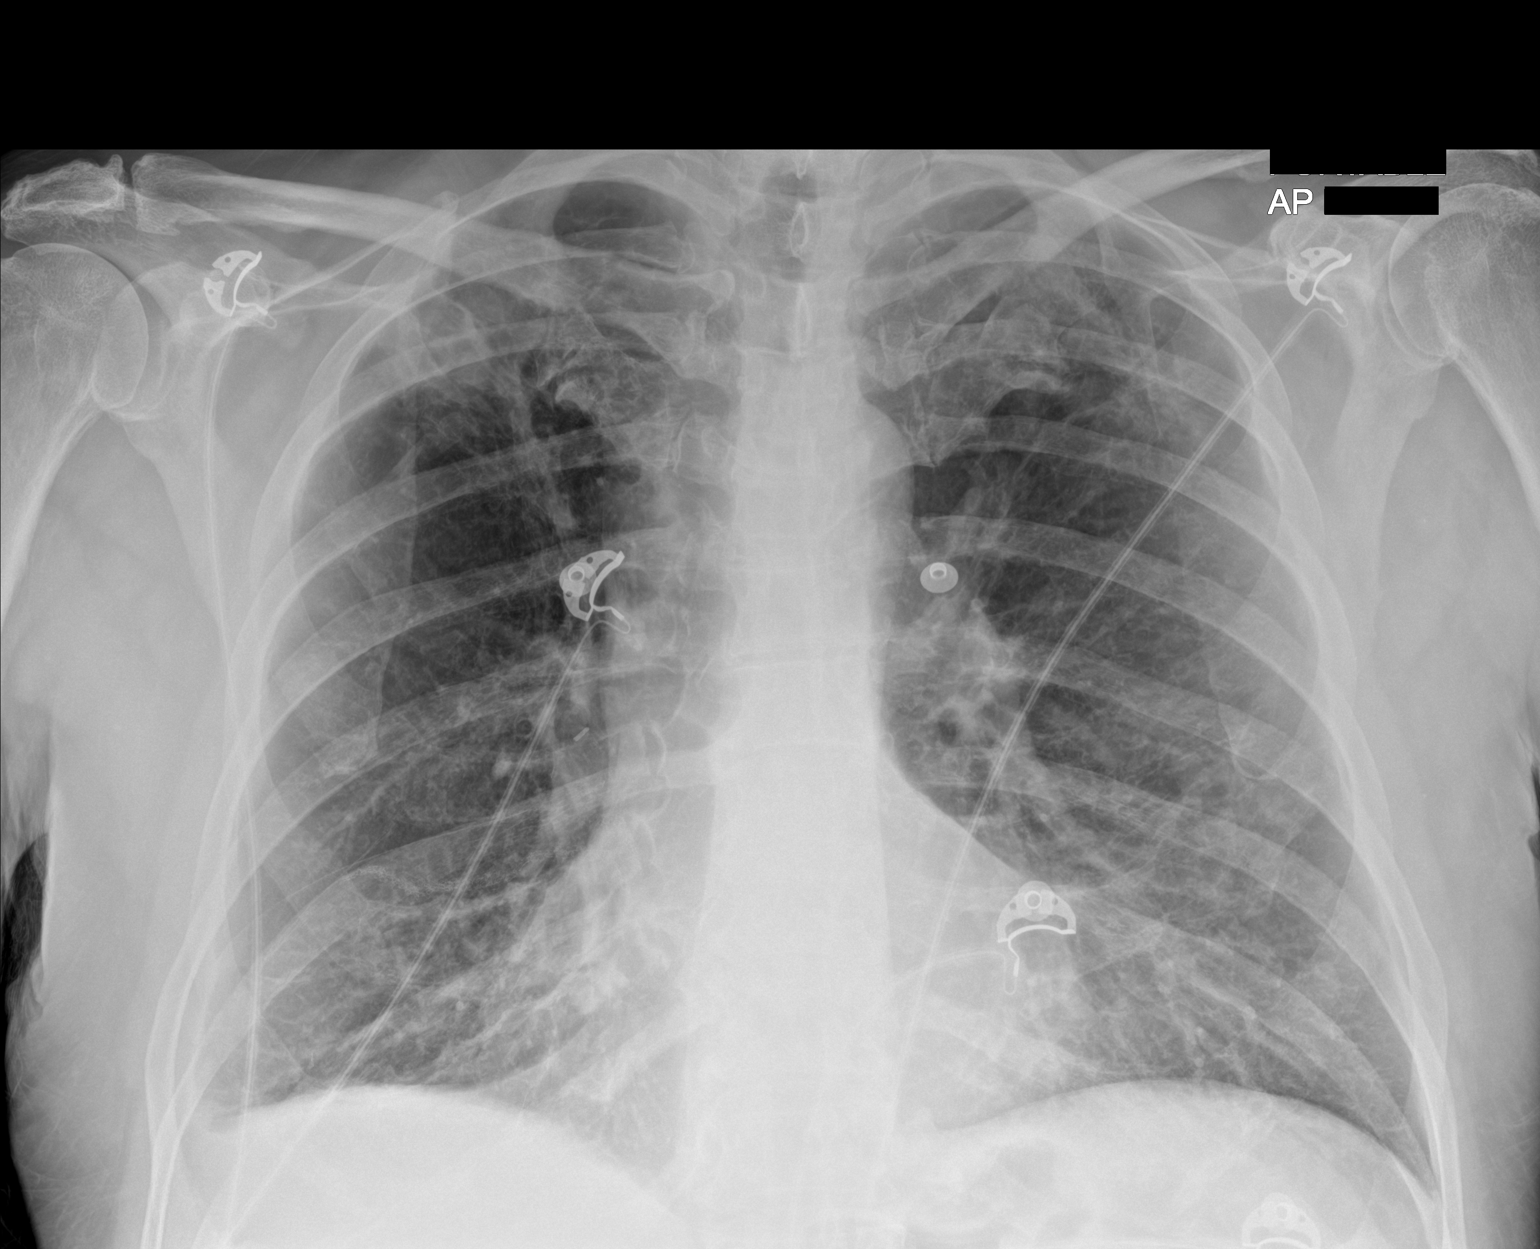

[1 of 1 positions shown; findings below may reference images not displayed]

FINDINGS: Patchy bilateral pulmonary opacity especially in the apical lungs.
There is hyperinflation and generalized apical lucency. Normal heart
size and mediastinal contours. No edema or pneumothorax. Blunting of
the lateral right costophrenic sulcus which is likely scarring in
the setting of prior right lung surgery.
IMPRESSION: 1. Patchy bilateral pulmonary opacity primarily concerning for
atypical pneumonia. Please correlate with [KD] status.
Especially if negative micro, recommend chest radiograph follow-up.
2. COPD and right lung surgery.

## 2019-09-18 MED ORDER — METHYLPREDNISOLONE SODIUM SUCC 125 MG IJ SOLR
125.0000 mg | Freq: Once | INTRAMUSCULAR | Status: AC
  Administered 2019-09-18: 125 mg via INTRAVENOUS
  Filled 2019-09-18: qty 2

## 2019-09-18 MED ORDER — PREDNISONE 20 MG PO TABS: 60.0000 mg | ORAL_TABLET | Freq: Every day | ORAL | 0 refills | Status: AC

## 2019-09-18 MED ORDER — DOXYCYCLINE HYCLATE 100 MG PO CAPS: 100.0000 mg | ORAL_CAPSULE | Freq: Two times a day (BID) | ORAL | 0 refills | Status: AC

## 2019-09-18 MED ORDER — DOXYCYCLINE HYCLATE 100 MG PO TABS
100.0000 mg | ORAL_TABLET | Freq: Once | ORAL | Status: AC
  Administered 2019-09-18: 06:00:00 100 mg via ORAL
  Filled 2019-09-18: qty 1

## 2019-09-18 MED ORDER — SODIUM CHLORIDE 0.9 % IV SOLN
1.0000 g | Freq: Once | INTRAVENOUS | Status: AC
  Administered 2019-09-18: 1 g via INTRAVENOUS
  Filled 2019-09-18: qty 10

## 2019-09-18 MED ORDER — AMOXICILLIN-POT CLAVULANATE 875-125 MG PO TABS: 1.0000 | ORAL_TABLET | Freq: Two times a day (BID) | ORAL | 0 refills | Status: AC

## 2019-09-18 NOTE — ED Provider Notes (Signed)
[facility] Emergency Department Provider Note  ____________________________________________  Time seen: Approximately 4:26 AM  I have reviewed the triage vital signs and the nursing notes.   HISTORY  Chief Complaint Chest Pain   HPI Samuel Moore is a 68 y.o. male with history of prostate cancer in remission, COPD, hypertension who presents for evaluation of chest pain.  Patient reports that he started not feeling well yesterday.  Was extremely fatigued.  Woke up this morning with chest pain and shortness of breath.  Has had a dry cough.  No fever.  No known exposures to COVID.  No loss of taste or smell, no sore throat.  Patient has had body aches and chills.  No vomiting or diarrhea.  Describes his pain as pressure in the center of his chest, currently 7 out of 10.  No wheezing.  Received full dose of aspirin and 1 nitro per EMS with some improvement of his pain.  No history of coronary artery disease.  No personal family history of blood clots, no recent travel immobilization, no leg pain or swelling, no hemoptysis, no exogenous hormones.   Past Medical History:  Diagnosis Date  . Cancer ([place])   . COPD (chronic obstructive pulmonary disease) ([place])   . Hypertension      Past Surgical History:  Procedure Laterality Date  . BLADDER REMOVAL    . LOBECTOMY    . prostate     removed    Prior to Admission medications   Medication Sig Start Date End Date Taking? Authorizing Provider  amoxicillin-clavulanate (AUGMENTIN) 875-125 MG tablet Take 1 tablet by mouth 2 (two) times daily for 10 days. 09/18/19 09/28/19  Rudene Re, MD  doxycycline (VIBRAMYCIN) 100 MG capsule Take 1 capsule (100 mg total) by mouth 2 (two) times daily for 10 days. 09/18/19 09/28/19  Rudene Re, MD  predniSONE (DELTASONE) 20 MG tablet Take 3 tablets (60 mg total) by mouth daily for 4 days. 09/18/19 09/22/19  Rudene Re, MD    Allergies Patient has no known  allergies.  No family history on file.  Social History Social History   Tobacco Use  . Smoking status: Former Smoker    Packs/day: 2.00    Years: 10.00    Pack years: 20.00    Types: Cigarettes    Quit date: 12/14/2002    Years since quitting: 16.7  . Smokeless tobacco: Never Used  Substance Use Topics  . Alcohol use: No  . Drug use: No    Review of Systems  Constitutional: Negative for fever. + chills, fatigue Eyes: Negative for visual changes. ENT: Negative for sore throat. Neck: No neck pain  Cardiovascular: + chest pressure Respiratory: + shortness of breath, cough Gastrointestinal: Negative for abdominal pain, vomiting or diarrhea. Genitourinary: Negative for dysuria. Musculoskeletal: Negative for back pain. Skin: Negative for rash. Neurological: Negative for headaches, weakness or numbness. Psych: No SI or HI  ____________________________________________   PHYSICAL EXAM:  VITAL SIGNS: ED Triage Vitals  Enc Vitals Group     BP 09/18/19 0346 129/73     Pulse Rate 09/18/19 0346 87     Resp 09/18/19 0346 (!) 26     Temp 09/18/19 0346 98 F (36.7 C)     Temp Source 09/18/19 0346 Oral     SpO2 09/18/19 0346 96 %     Weight 09/18/19 0349 215 lb (97.5 kg)     Height 09/18/19 0349 6' (1.829 m)     Head Circumference --  Peak Flow --      Pain Score 09/18/19 0347 8     Pain Loc --      Pain Edu? --      Excl. in [place]? --     Constitutional: Alert and oriented. Well appearing and in no apparent distress. HEENT:      Head: Normocephalic and atraumatic.         Eyes: Conjunctivae are normal. Sclera is non-icteric.       Mouth/Throat: Mucous membranes are moist.       Neck: Supple with no signs of meningismus. Cardiovascular: Regular rate and rhythm. No murmurs, gallops, or rubs. 2+ symmetrical distal pulses are present in all extremities. No JVD. Respiratory: Slight increased work of breathing, tachypneic, normal sats, lungs are clear to auscultation  bilaterally with no wheezing, crackles, rhonchi  Gastrointestinal: Soft, non tender, and non distended with positive bowel sounds. No rebound or guarding. Musculoskeletal: Nontender with normal range of motion in all extremities. No edema, cyanosis, or erythema of extremities. Neurologic: Normal speech and language. Face is symmetric. Moving all extremities. No gross focal neurologic deficits are appreciated. Skin: Skin is warm, dry and intact. No rash noted. Psychiatric: Mood and affect are normal. Speech and behavior are normal.  ____________________________________________   LABS (all labs ordered are listed, but only abnormal results are displayed)  Labs Reviewed  BASIC METABOLIC PANEL - Abnormal; Notable for the following components:      Result Value   CO2 21 (*)    Glucose, Bld 122 (*)    Calcium 8.0 (*)    All other components within normal limits  CBC - Abnormal; Notable for the following components:   RBC 3.49 (*)    Hemoglobin 9.7 (*)    HCT 31.6 (*)    RDW 16.7 (*)    Platelets 119 (*)    All other components within normal limits  SARS CORONAVIRUS 2 (HOSPITAL ORDER, [place] [facility])  INFLUENZA PANEL BY PCR (TYPE A & B)  PROCALCITONIN  TROPONIN I (HIGH SENSITIVITY)  TROPONIN I (HIGH SENSITIVITY)   ____________________________________________  EKG  ED ECG REPORT I, Rudene Re, the attending physician, personally viewed and interpreted this ECG.   Normal sinus rhythm, rate of 88, normal intervals, normal axis, no ST elevations or depressions. ____________________________________________  RADIOLOGY  I have personally reviewed the images performed during this visit and I agree with the Radiologist's read.   Interpretation by Radiologist:  Dg Chest Port 1 View  Result Date: 09/18/2019 CLINICAL DATA:  Chest pain, chills, and weakness EXAM: PORTABLE CHEST 1 VIEW COMPARISON:  02/15/2015 FINDINGS: Patchy bilateral pulmonary opacity  especially in the apical lungs. There is hyperinflation and generalized apical lucency. Normal heart size and mediastinal contours. No edema or pneumothorax. Blunting of the lateral right costophrenic sulcus which is likely scarring in the setting of prior right lung surgery. IMPRESSION: 1. Patchy bilateral pulmonary opacity primarily concerning for atypical pneumonia. Please correlate with COVID-19 status. Especially if negative micro, recommend chest radiograph follow-up. 2. COPD and right lung surgery. Electronically Signed   By: Monte Fantasia M.D.   On: 09/18/2019 04:17     ____________________________________________   PROCEDURES  Procedure(s) performed: None Procedures Critical Care performed:  None ____________________________________________   INITIAL IMPRESSION / ASSESSMENT AND PLAN / ED COURSE  68 y.o. male with history of prostate cancer in remission, COPD, hypertension who presents for evaluation of chest pressure, SOB, dry cough, chills, fatigue, and body aches since yesterday  morning.  Patient mildly increased work of breathing but no hypoxia, moving good air with no wheezing.  EKG showing no ischemic changes.  Differential diagnosis including pneumonia versus COVID versus flu versus viral URI. Will swab for Flu and Covid, get labs, get CXR.    _________________________ 6:03 AM on 09/18/2019 -----------------------------------------  Chest x-ray concerning for pneumonia.  Flu and COVID negative.  Patient with normal work of breathing, normal sats both at rest and with ambulation.  Continues to have good air movement.  Patient was given Rocephin and doxycycline.  Will discharge home on Keflex and doxy.  Since patient has COPD we will also put him on steroids.  Recommended close follow-up with PCP, discussed my standard return precautions, and recommended checking oxygenation 2-3 times a day at rest and with ambulation.  Recommended return to the emergency room for sats of 93 or  below.  Patient does have a pulse oximeter at home.  Explained to him that since his symptoms have started less than 24 hours I am not fully convinced this is not a COVID.  Therefore he needs very close follow-up and monitoring of his sats.  Also discussed quarantine with patient and his wife. Repeat troponin is pending. If negative will dc home to care of his wife.   _________________________ 6:54 AM on 09/18/2019 -----------------------------------------  Second troponin is pending.  Care transferred to Dr. Charna Archer.  As part of my medical decision making, I reviewed the following data within the [place] notes reviewed and incorporated, Labs reviewed , EKG interpreted , Old EKG reviewed, Old chart reviewed, Radiograph reviewed , Notes from prior ED visits and [place] Controlled Substance Database   Patient was evaluated in Emergency Department today for the symptoms described in the history of present illness. Patient was evaluated in the context of the global COVID-19 pandemic, which necessitated consideration that the patient might be at risk for infection with the SARS-CoV-2 virus that causes COVID-19. Institutional protocols and algorithms that pertain to the evaluation of patients at risk for COVID-19 are in a state of rapid change based on information released by regulatory bodies including the CDC and federal and state organizations. These policies and algorithms were followed during the patient's care in the ED.   ____________________________________________   FINAL CLINICAL IMPRESSION(S) / ED DIAGNOSES   Final diagnoses:  Community acquired pneumonia, unspecified laterality      NEW MEDICATIONS STARTED DURING THIS VISIT:  ED Discharge Orders         Ordered    predniSONE (DELTASONE) 20 MG tablet  Daily     09/18/19 0607    amoxicillin-clavulanate (AUGMENTIN) 875-125 MG tablet  2 times daily     09/18/19 0607    doxycycline (VIBRAMYCIN) 100 MG capsule   2 times daily     09/18/19 0607           Note:  This document was prepared using Dragon voice recognition software and may include unintentional dictation errors.    Rudene Re, MD 09/18/19 [phone]

## 2019-09-18 NOTE — ED Triage Notes (Signed)
Pt arrives via [facility] with c/o chest pressure, chills, and weakness. Pt was administered 324 mg aspirin and x 1 nitro by EMS. Pt states that he was on the way to the hospital when he had to pull over to call EMS.

## 2019-09-18 NOTE — ED Provider Notes (Signed)
-----------------------------------------   6:59 AM on 09/18/2019 -----------------------------------------  Blood pressure 109/66, pulse 77, temperature 98 F (36.7 C), temperature source Oral, resp. rate 19, height 6' (1.829 m), weight 97.5 kg, SpO2 97 %.  Assuming care from Dr. Alfred Levins.  In short, Samuel Moore is a 68 y.o. male with a chief complaint of Chest Pain .  Refer to the original H&P for additional details.  The current plan of care is to follow-up repeat troponin, if negative plan for discharge home with treatment for pneumonia.  Repeat troponin within normal limits, patient and family continue to desire discharge home with plan for treatment with antibiotics.  Reviewed return precautions, patient and family agree with plan.    Blake Divine, MD 09/18/19 [phone]

## 2019-09-18 NOTE — ED Notes (Signed)
MD Veronese at bedside  

## 2019-09-18 NOTE — Discharge Instructions (Signed)

## 2019-09-26 ENCOUNTER — Emergency Department: Payer: No Typology Code available for payment source

## 2019-09-26 ENCOUNTER — Emergency Department
Admission: EM | Admit: 2019-09-26 | Discharge: 2019-09-26 | Disposition: A | Discharge status: Home / Self Care | Payer: No Typology Code available for payment source | Attending: Emergency Medicine | Admitting: Emergency Medicine

## 2019-09-26 ENCOUNTER — Other Ambulatory Visit: Payer: Self-pay

## 2019-09-26 DIAGNOSIS — R0602 Shortness of breath: Secondary | ICD-10-CM | POA: Insufficient documentation

## 2019-09-26 DIAGNOSIS — Z87891 Personal history of nicotine dependence: Secondary | ICD-10-CM | POA: Insufficient documentation

## 2019-09-26 DIAGNOSIS — I10 Essential (primary) hypertension: Secondary | ICD-10-CM | POA: Insufficient documentation

## 2019-09-26 DIAGNOSIS — R531 Weakness: Secondary | ICD-10-CM | POA: Diagnosis not present

## 2019-09-26 DIAGNOSIS — J449 Chronic obstructive pulmonary disease, unspecified: Secondary | ICD-10-CM | POA: Insufficient documentation

## 2019-09-26 LAB — COMPREHENSIVE METABOLIC PANEL
ALT: 13 U/L (ref 0–44)
AST: 16 U/L (ref 15–41)
Albumin: 3.6 g/dL (ref 3.5–5.0)
Alkaline Phosphatase: 72 U/L (ref 38–126)
Anion gap: 9 (ref 5–15)
BUN: 29 mg/dL — ABNORMAL HIGH (ref 8–23)
CO2: 21 mmol/L — ABNORMAL LOW (ref 22–32)
Calcium: 8.8 mg/dL — ABNORMAL LOW (ref 8.9–10.3)
Chloride: 106 mmol/L (ref 98–111)
Creatinine, Ser: 1.19 mg/dL (ref 0.61–1.24)
GFR calc Af Amer: >60 mL/min (ref >60)
GFR calc non Af Amer: >60 mL/min (ref >60)
Glucose, Bld: 148 mg/dL — ABNORMAL HIGH (ref 70–99)
Potassium: 3.4 mmol/L — ABNORMAL LOW (ref 3.5–5.1)
Sodium: 136 mmol/L (ref 135–145)
Total Bilirubin: 0.4 mg/dL (ref 0.3–1.2)
Total Protein: 6.9 g/dL (ref 6.5–8.1)

## 2019-09-26 LAB — CBC WITH DIFFERENTIAL/PLATELET
Abs Immature Granulocytes: 0.06 10*3/uL (ref 0.00–0.07)
Basophils Absolute: 0 10*3/uL (ref 0.0–0.1)
Basophils Relative: 0 %
Eosinophils Absolute: 0.1 10*3/uL (ref 0.0–0.5)
Eosinophils Relative: 1 %
HCT: 35 % — ABNORMAL LOW (ref 39.0–52.0)
Hemoglobin: 10.9 g/dL — ABNORMAL LOW (ref 13.0–17.0)
Immature Granulocytes: 1 %
Lymphocytes Relative: 17 %
Lymphs Abs: 1.5 10*3/uL (ref 0.7–4.0)
MCH: 27.7 pg (ref 26.0–34.0)
MCHC: 31.1 g/dL (ref 30.0–36.0)
MCV: 89.1 fL (ref 80.0–100.0)
Monocytes Absolute: 0.4 10*3/uL (ref 0.1–1.0)
Monocytes Relative: 5 %
Neutro Abs: 6.8 10*3/uL (ref 1.7–7.7)
Neutrophils Relative %: 76 %
Platelets: 155 10*3/uL (ref 150–400)
RBC: 3.93 MIL/uL — ABNORMAL LOW (ref 4.22–5.81)
RDW: 16.4 % — ABNORMAL HIGH (ref 11.5–15.5)
WBC: 8.8 10*3/uL (ref 4.0–10.5)
nRBC: 0 % (ref 0.0–0.2)

## 2019-09-26 LAB — URINALYSIS, COMPLETE (UACMP) WITH MICROSCOPIC
Bilirubin Urine: NEGATIVE
Glucose, UA: NEGATIVE mg/dL
Ketones, ur: NEGATIVE mg/dL
Leukocytes,Ua: NEGATIVE
Nitrite: NEGATIVE
Protein, ur: NEGATIVE mg/dL
Specific Gravity, Urine: 1.011 (ref 1.005–1.030)
pH: 6 (ref 5.0–8.0)

## 2019-09-26 LAB — TROPONIN I (HIGH SENSITIVITY): Troponin I (High Sensitivity): 13 ng/L (ref <18)

## 2019-09-26 LAB — URINE DRUG SCREEN, QUALITATIVE (ARMC ONLY)
Amphetamines, Ur Screen: NOT DETECTED
Barbiturates, Ur Screen: NOT DETECTED
Benzodiazepine, Ur Scrn: NOT DETECTED
Cannabinoid 50 Ng, Ur ~~LOC~~: NOT DETECTED
Cocaine Metabolite,Ur ~~LOC~~: NOT DETECTED
MDMA (Ecstasy)Ur Screen: NOT DETECTED
Methadone Scn, Ur: NOT DETECTED
Opiate, Ur Screen: NOT DETECTED
Phencyclidine (PCP) Ur S: NOT DETECTED
Tricyclic, Ur Screen: NOT DETECTED

## 2019-09-26 LAB — ETHANOL: Alcohol, Ethyl (B): <10 mg/dL (ref <10)

## 2019-09-26 IMAGING — CT CT HEAD W/O CM
3 of 4 series · 15 of 47 positions shown, 18 images · non-contrast
Comparison: Report of brain MRI [DATE] (no images available).

CLINICAL DATA: 67-year-old male with unexplained altered mental
status. Recently diagnosed with pneumonia.

EXAM:
CT HEAD WITHOUT CONTRAST
TECHNIQUE: Contiguous axial images were obtained from the base of the skull
through the vertex without intravenous contrast.

[Series 2: head wo · axial · 0.47mm/px · z∈[-141,-21]mm · 9 of 30 slices shown, 12 images]
[im 3/30  brain]
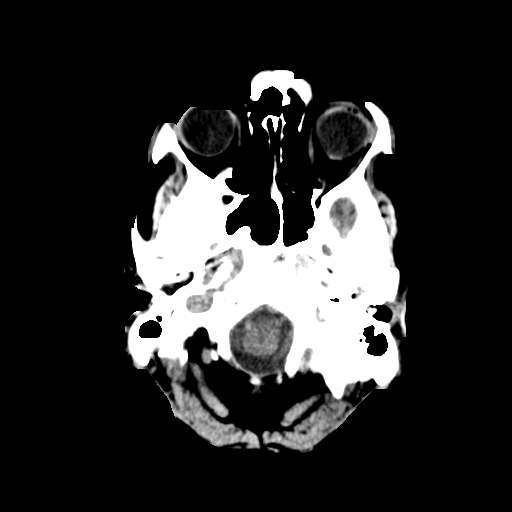
[im 3/30  bone]
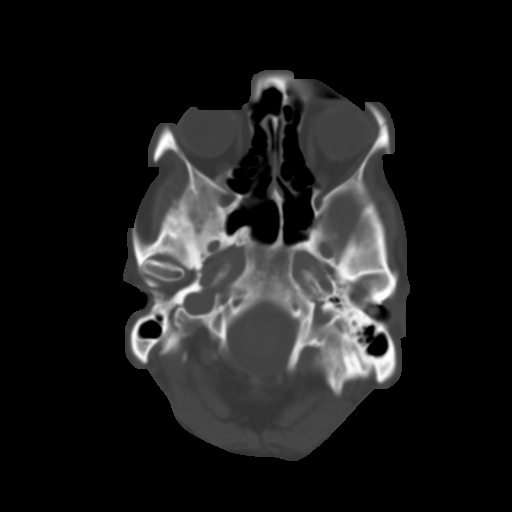
[im 7/30  brain]
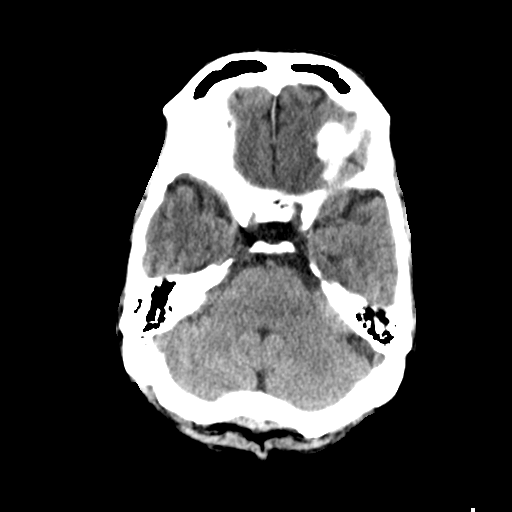
[im 9/30  brain]
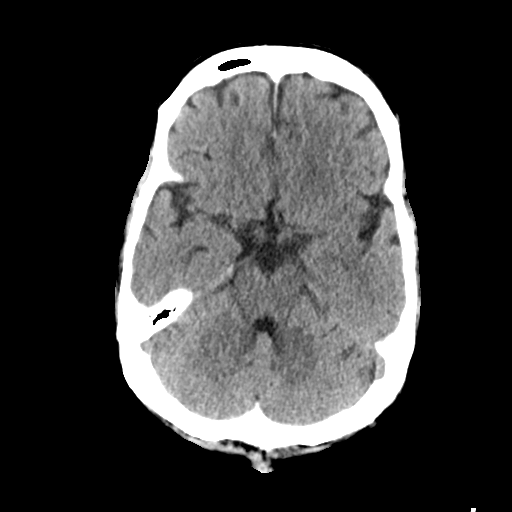
[im 13/30  brain]
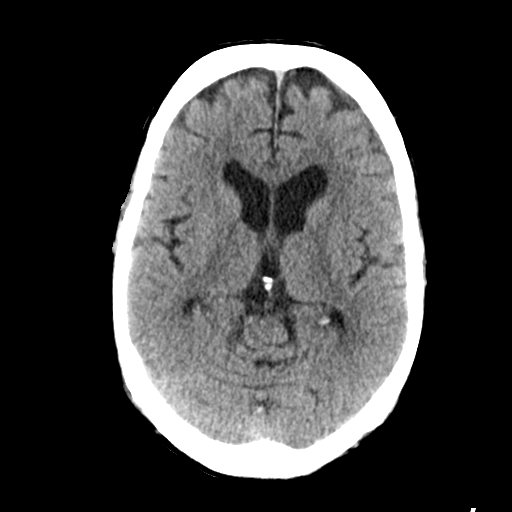
[im 15/30  brain]
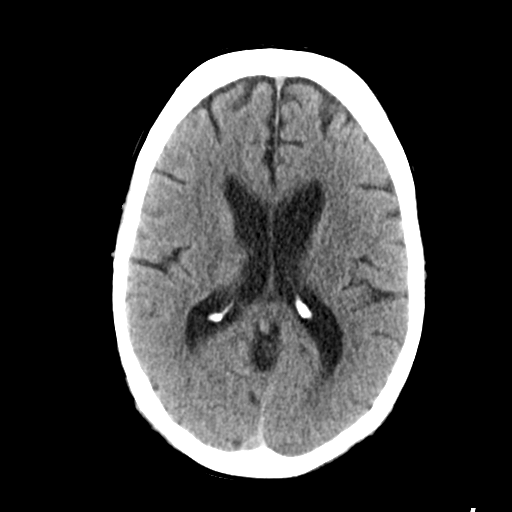
[im 15/30  bone]
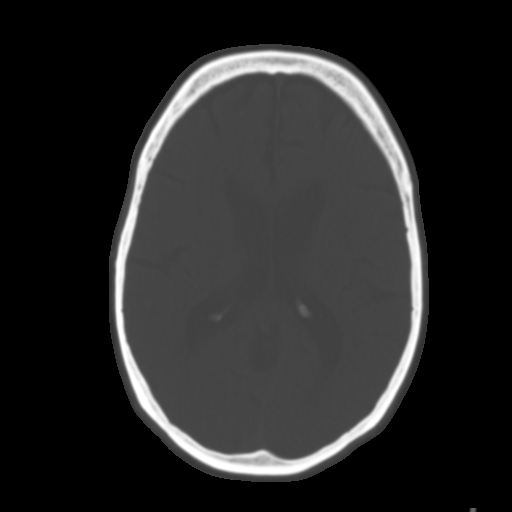
[im 17/30  brain]
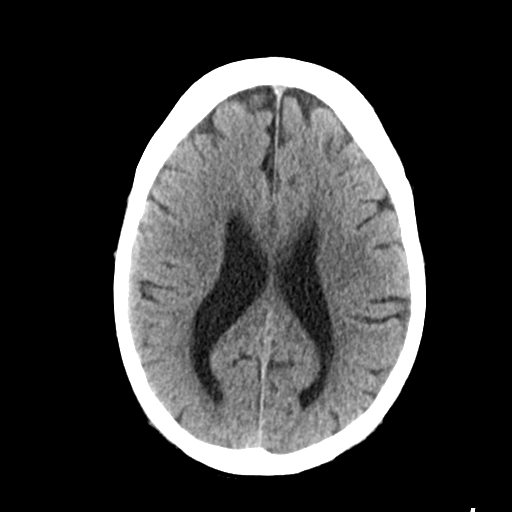
[im 21/30  brain]
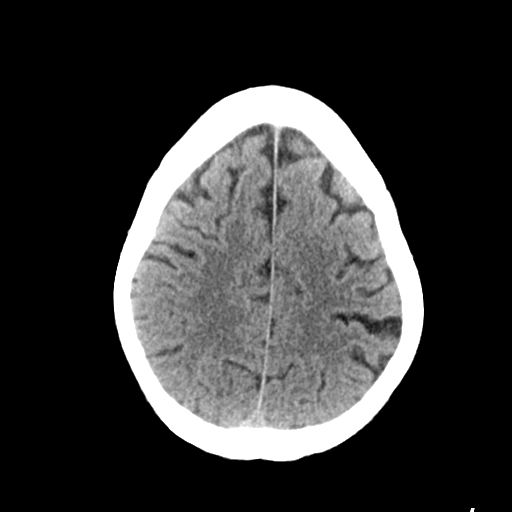
[im 23/30  brain]
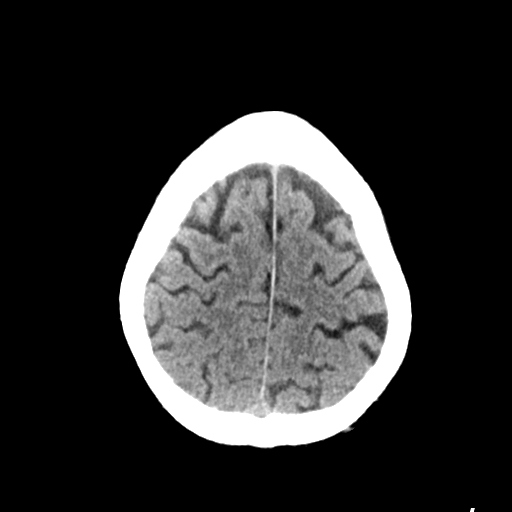
[im 27/30  brain]
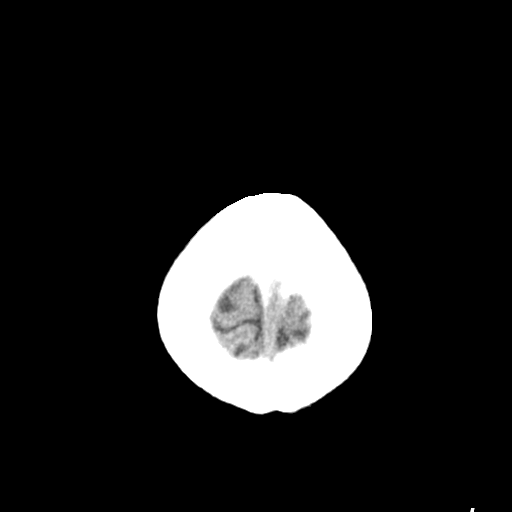
[im 27/30  bone]
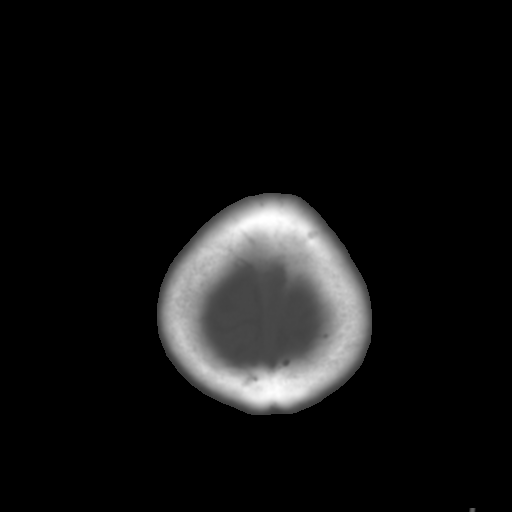

[Series 4: coronal soft tissue · coronal · 0.30mm/px · 3 of 69 slices shown]
[im 23/69  brain]
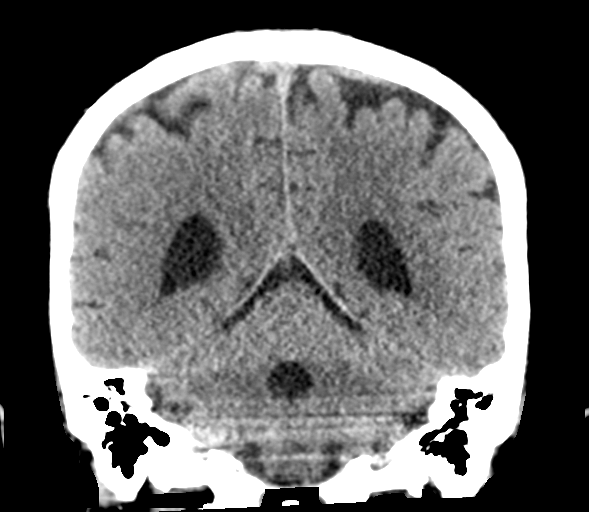
[im 31/69  brain]
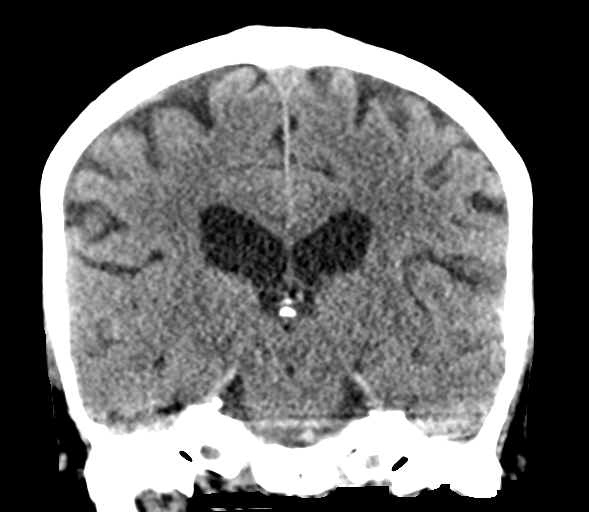
[im 38/69  brain]
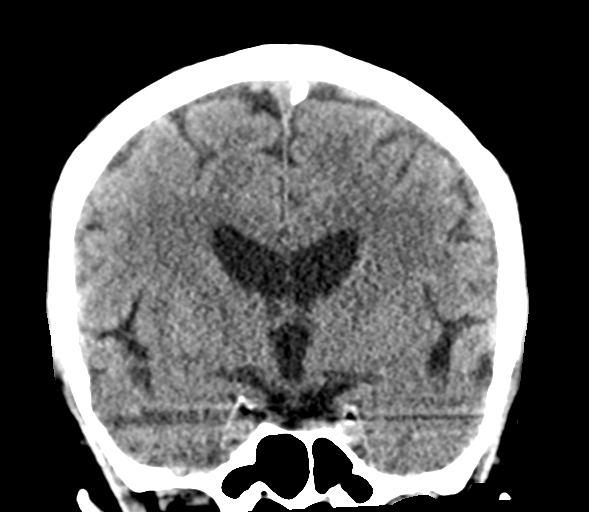

[Series 5: sagittal soft tissue · sagittal · 0.31mm/px · 3 of 51 slices shown]
[im 17/51  brain]
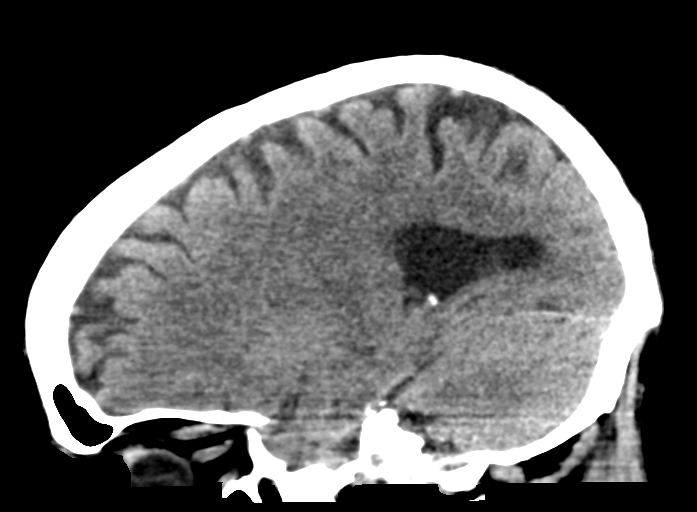
[im 26/51  brain]
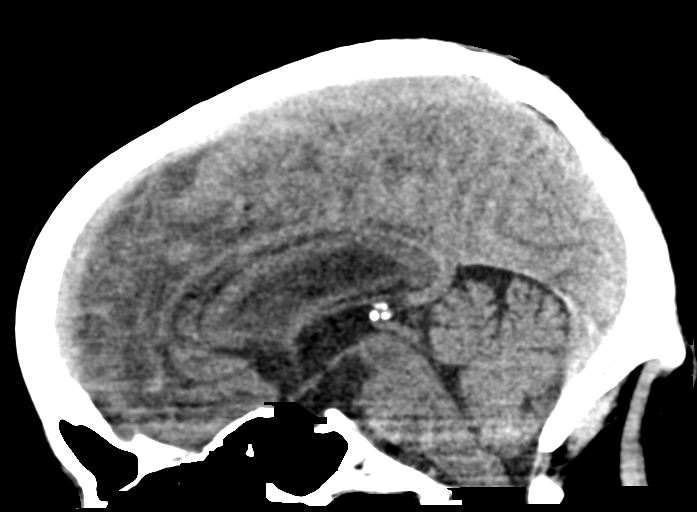
[im 34/51  brain]
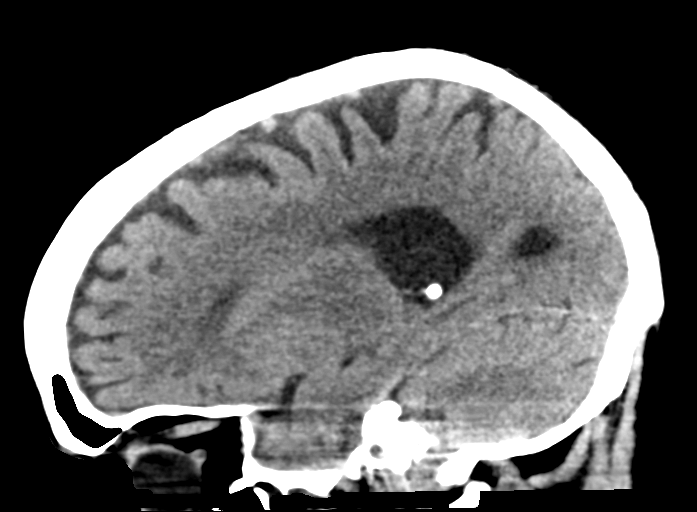

[15 of 47 positions shown; findings below may reference images not displayed]

FINDINGS: Brain: Cerebral volume is within normal limits for age. No midline
shift, ventriculomegaly, mass effect, evidence of mass lesion,
intracranial hemorrhage or evidence of cortically based acute
infarction.

There is a small chronic infarcts suspected in the right cerebellum
on series 2, image 10. Elsewhere normal gray-white matter
differentiation, with no cerebral cortical encephalomalacia
identified.

Vascular: No suspicious intracranial vascular hyperdensity.

Skull: No acute osseous abnormality identified.

Sinuses/Orbits: Visualized paranasal sinuses and mastoids are clear.

Other: Visualized orbits and scalp soft tissues are within normal
limits.
IMPRESSION: 1. No acute intracranial abnormality identified.
2. Small chronic appearing right cerebellar infarct.

## 2019-09-26 IMAGING — CT CT ANGIO CHEST
2 of 6 series · 17 of 46 positions shown · IV contrast (omnipaque)
Comparison: Portable chest [DATE]. Chest radiographs
[DATE].

CLINICAL DATA: 67-year-old male with unexplained altered mental
status. Recently diagnosed with pneumonia. Chest tightness and
shortness of breath. Prior lung surgery.

EXAM:
CT ANGIOGRAPHY CHEST WITH CONTRAST
TECHNIQUE: Multidetector CT imaging of the chest was performed using the
standard protocol during bolus administration of intravenous
contrast. Multiplanar CT image reconstructions and MIPs were
obtained to evaluate the vascular anatomy.
CONTRAST:  75mL OMNIPAQUE IOHEXOL 350 MG/ML SOLN

[Series 5: thins · axial · 0.77mm/px · z∈[-548,-266]mm · 14 of 310 slices shown]
[im 14/310  lung]
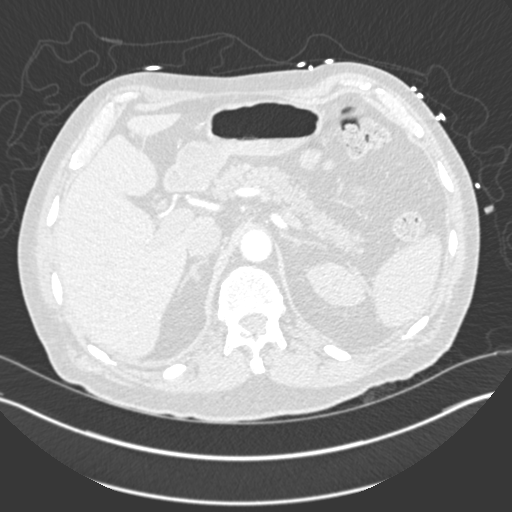
[im 41/310  soft-tissue]
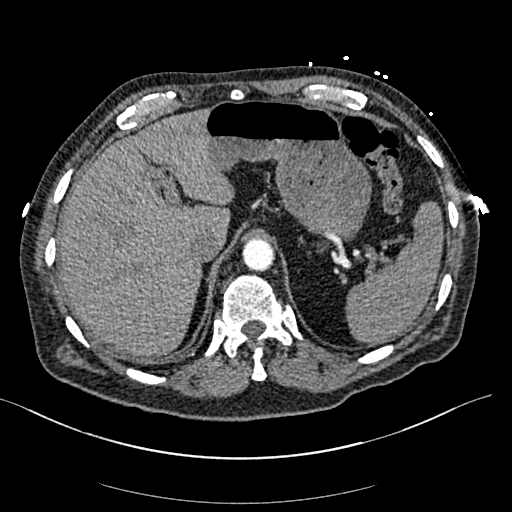
[im 54/310  lung]
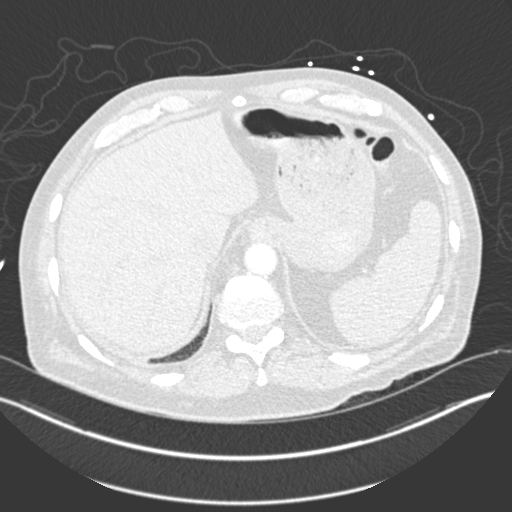
[im 81/310  soft-tissue]
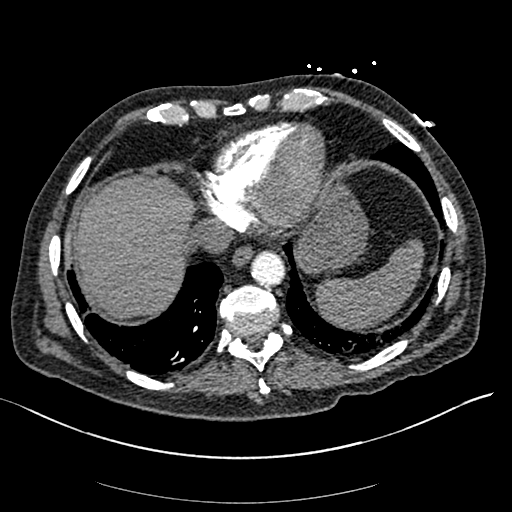
[im 108/310  lung]
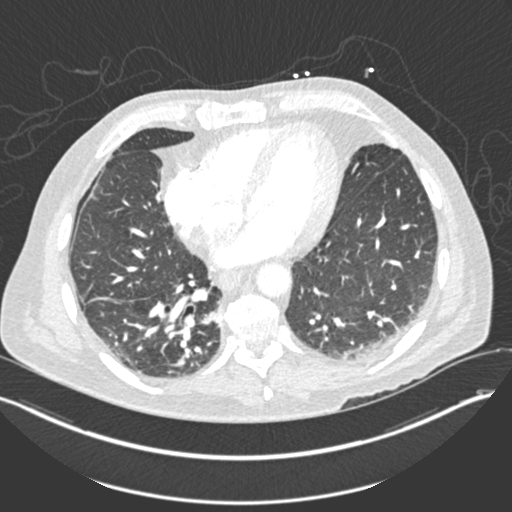
[im 121/310  soft-tissue]
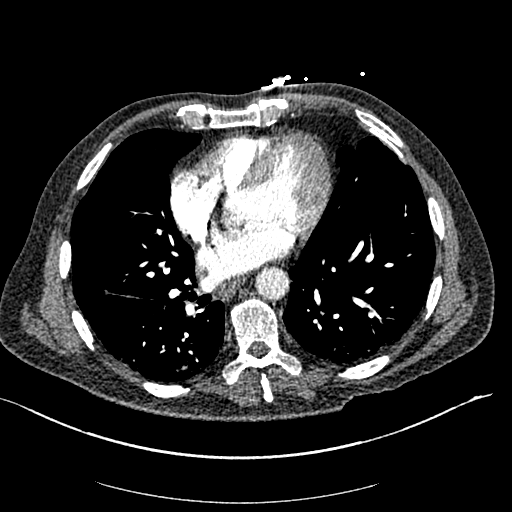
[im 148/310  lung]
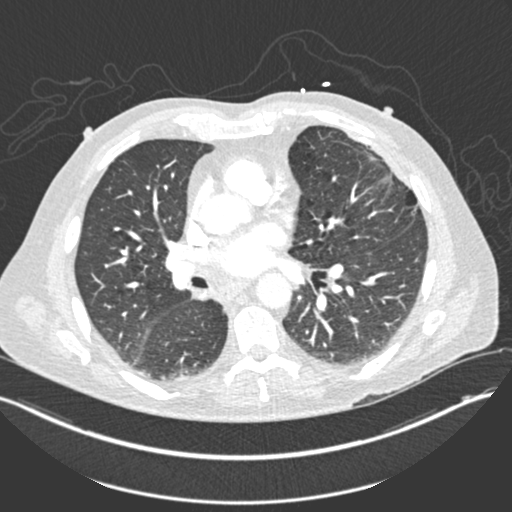
[im 162/310  soft-tissue]
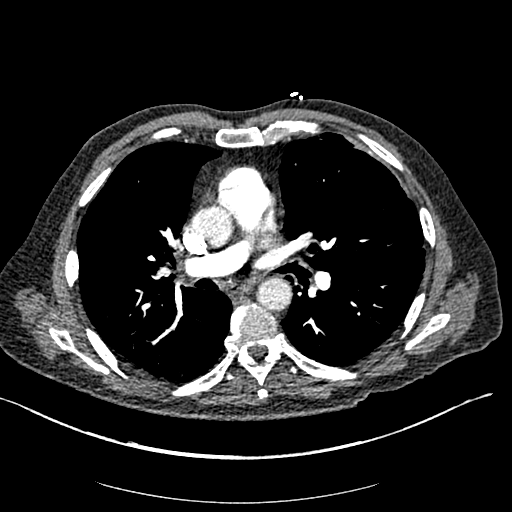
[im 189/310  lung]
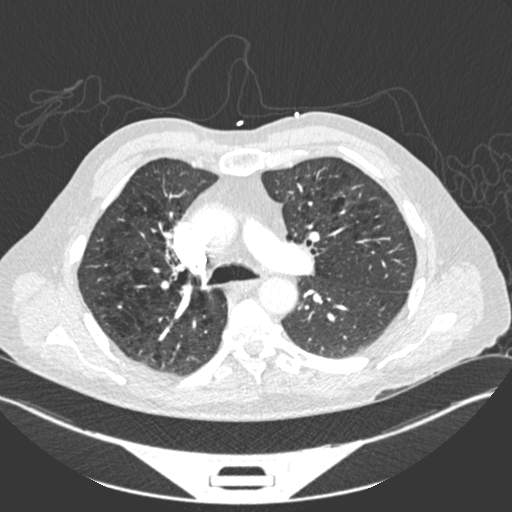
[im 202/310  soft-tissue]
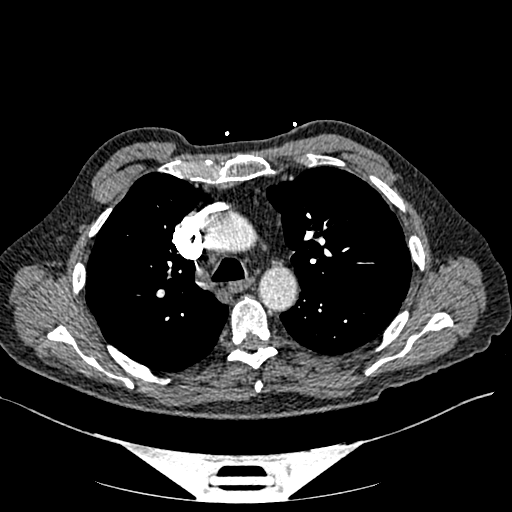
[im 229/310  lung]
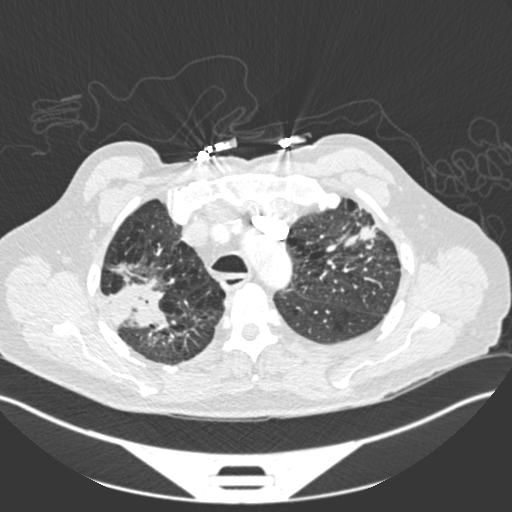
[im 256/310  soft-tissue]
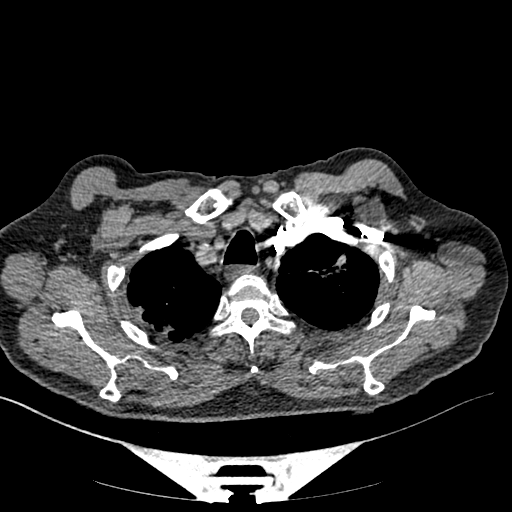
[im 269/310  lung]
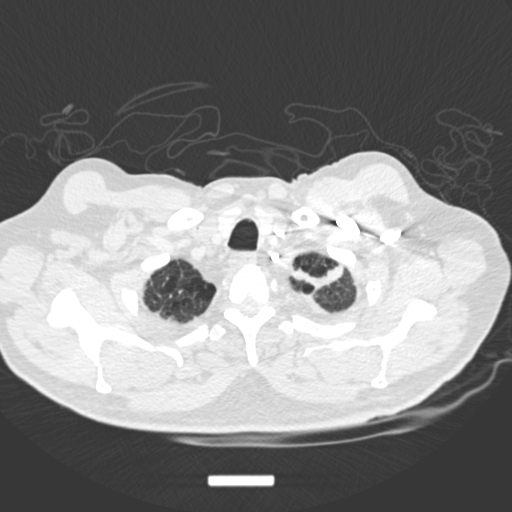
[im 296/310  soft-tissue]
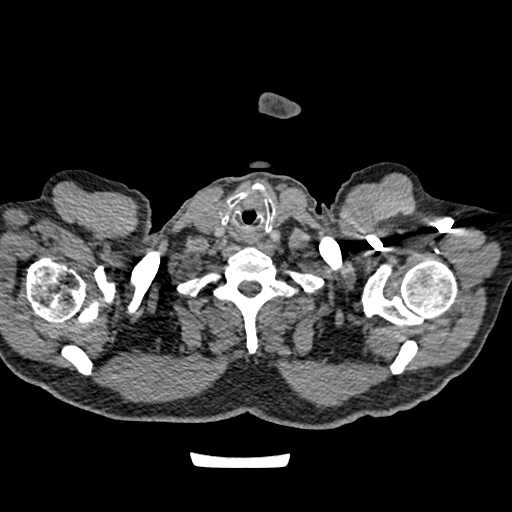

[Series 7: coronal mpr · coronal · 0.61mm/px · 3 of 82 slices shown]
[im 21/82  soft-tissue]
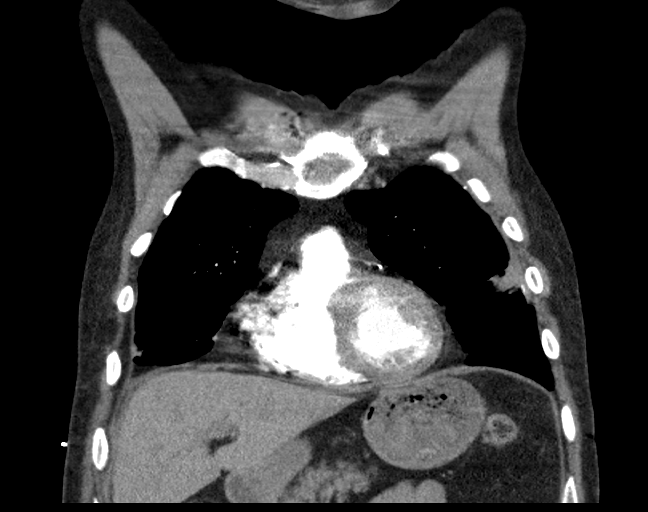
[im 41/82  soft-tissue]
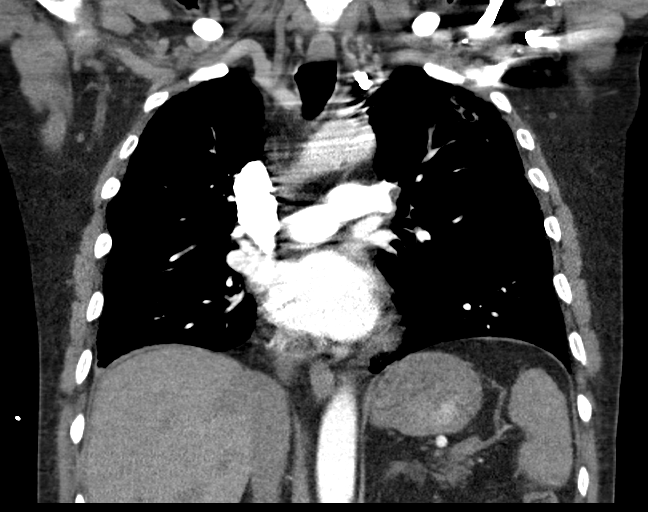
[im 61/82  soft-tissue]
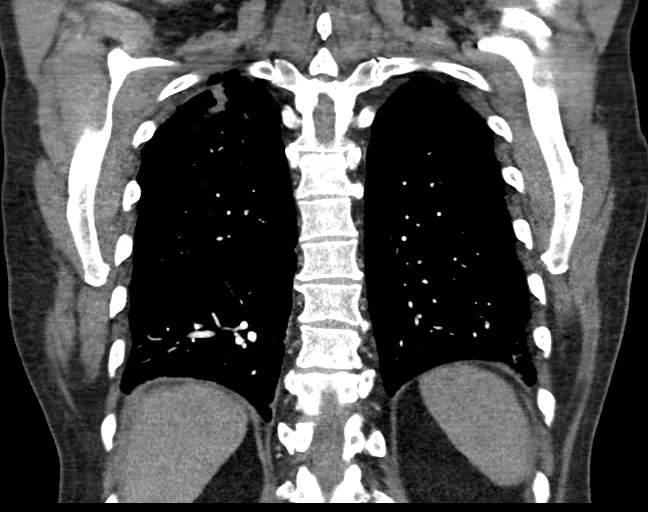

[17 of 46 positions shown; findings below may reference images not displayed]

FINDINGS: Cardiovascular: Good contrast bolus timing in the pulmonary arterial
tree. Staple line along the posterior right hilum.

No focal filling defect identified in the pulmonary arteries to
suggest acute pulmonary embolism.

Calcified coronary artery atherosclerosis on series 5, image 164. No
cardiomegaly or pericardial effusion. Negative visible aorta aside
from mild atherosclerosis.

Mediastinum/Nodes: Staple line along the posterior right hilum. No
mediastinal or hilar lymphadenopathy.

Lungs/Pleura: Some of the right lower lobe appears to be surgically
absent. The residual major airways are patent.

In the right upper lobe there is a relatively large 4-5 centimeter
area of consolidation peripherally with some air bronchograms and
also some cavitary changes. See series 6, image 26, coronal series
7, images 53 and 57. Underlying centrilobular emphysema. Mild
regional architectural distortion.

There is a smaller 3 centimeter area of irregular peribronchial
opacity in the peripheral left upper lobe with mild bronchiectasis
and/or cavitation. See series 6, image 31 and series 7, image 39.
Underlying centrilobular emphysema and some regional architectural
distortion.

Confluent subpleural scarring suspected in the lingula on series 6,
image 60. There are nearby pleural calcifications.

Superimposed dependent atelectasis greater on the left. Mild
postoperative appearing right lower lobe architectural distortion
with no suspicious features.

No pleural effusion.

Upper Abdomen: Negative visible liver, spleen, pancreas, adrenal
glands, left kidney, and bowel in the upper abdomen.

Musculoskeletal: Some postoperative changes to the right ribs. No
acute or suspicious osseous lesion.

Review of the MIP images confirms the above findings.
IMPRESSION: 1. Negative for acute pulmonary embolus.
2. Confluent 5 cm area of right upper lobe consolidation some small
cavitation is suspicious for a Necrotizing Pneumonia.
Smaller peripheral left upper lobe area of opacity with
bronchiectasis or cavitation also appears to be
infectious/inflammatory.
No pleural effusion.
3. Underlying Emphysema ([IW]-[IW]). Postoperative changes to the
right lower lobe with no adverse features. Lingula calcified pleural
plaques with subjacent scarring.
4. Calcified coronary artery atherosclerosis.
5. Followup chest imaging is recommended in 3-4 weeks to ensure
resolution and exclude underlying malignancy.

## 2019-09-26 MED ORDER — IOHEXOL 350 MG/ML SOLN
75.0000 mL | Freq: Once | INTRAVENOUS | Status: AC | PRN
  Administered 2019-09-26: 75 mL via INTRAVENOUS

## 2019-09-26 MED ORDER — SODIUM CHLORIDE 0.9 % IV BOLUS
500.0000 mL | Freq: Once | INTRAVENOUS | Status: AC
  Administered 2019-09-26: 500 mL via INTRAVENOUS

## 2019-09-26 NOTE — Discharge Instructions (Addendum)
Your lab tests and CT scan of the head and chest were okay today.  The CT scan of the chest does show the aftermath of your recent pneumonia, and since you are feeling better this can continue to be monitored by your primary care doctor or a pulmonologist.

## 2019-09-26 NOTE — ED Provider Notes (Signed)
[facility] Emergency Department Provider Note  ____________________________________________  Time seen: Approximately 7:38 PM  I have reviewed the triage vital signs and the nursing notes.   HISTORY  Chief Complaint Weakness and Shortness of Breath    HPI Samuel Moore is a 68 y.o. male with a history of bladder cancer status post resection, lung cancer, COPD, hypertension who comes to the ED complaining of chest tightness and shortness of breath that started at about 3:00 PM today.  Nonradiating, no aggravating or alleviating factors.  Somewhat improved currently but still having generalized weakness.  Denies lateralizing weakness or paresthesia.  No headache or acute vision change.  Review of electronic medical record shows patient had presented to [facility] emergency department 1 month ago with similar symptoms.  Extensive work-up at that time was negative.  4 months ago patient was admitted to [facility] with a suspected stroke.  He received TPA and had subsequent negative MRI.      Past Medical History:  Diagnosis Date  . Cancer ([place])   . COPD (chronic obstructive pulmonary disease) ([place])   . Hypertension      There are no active problems to display for this patient.    Past Surgical History:  Procedure Laterality Date  . BLADDER REMOVAL    . LOBECTOMY    . prostate     removed     Prior to Admission medications   Medication Sig Start Date End Date Taking? Authorizing Provider  amoxicillin-clavulanate (AUGMENTIN) 875-125 MG tablet Take 1 tablet by mouth 2 (two) times daily for 10 days. 09/18/19 09/28/19  Rudene Re, MD  doxycycline (VIBRAMYCIN) 100 MG capsule Take 1 capsule (100 mg total) by mouth 2 (two) times daily for 10 days. 09/18/19 09/28/19  Rudene Re, MD     Allergies Patient has no known allergies.   History reviewed. No pertinent family history.  Social History Social History   Tobacco Use  . Smoking status:  Former Smoker    Packs/day: 2.00    Years: 10.00    Pack years: 20.00    Types: Cigarettes    Quit date: 12/14/2002    Years since quitting: 16.7  . Smokeless tobacco: Never Used  Substance Use Topics  . Alcohol use: No  . Drug use: No    Review of Systems  Constitutional:   No fever or chills.  ENT:   No sore throat. No rhinorrhea. Cardiovascular: Positive chest pain as above without syncope. Respiratory: Positive shortness of breath without cough. Gastrointestinal:   Negative for abdominal pain, vomiting and diarrhea.  Musculoskeletal:   Negative for focal pain or swelling All other systems reviewed and are negative except as documented above in ROS and HPI.  ____________________________________________   PHYSICAL EXAM:  VITAL SIGNS: ED Triage Vitals  Enc Vitals Group     BP --      Pulse Rate 09/26/19 1909 99     Resp 09/26/19 1909 19     Temp 09/26/19 1909 98.6 F (37 C)     Temp Source 09/26/19 1909 Oral     SpO2 09/26/19 1906 98 %     Weight 09/26/19 1910 180 lb (81.6 kg)     Height 09/26/19 1910 6' (1.829 m)     Head Circumference --      Peak Flow --      Pain Score 09/26/19 1909 8     Pain Loc --      Pain Edu? --  Excl. in [place]? --     Vital signs reviewed, nursing assessments reviewed.   Constitutional:   Alert and oriented. Non-toxic appearance. Eyes:   Conjunctivae are normal. EOMI. PERRL. ENT      Head:   Normocephalic and atraumatic.      Nose:   Wearing a mask.      Mouth/Throat:   Wearing a mask.      Neck:   No meningismus. Full ROM. Hematological/Lymphatic/Immunilogical:   No cervical lymphadenopathy. Cardiovascular:   Tachycardia heart rate 105. Symmetric bilateral radial and DP pulses.  No murmurs. Cap refill less than 2 seconds. Respiratory:   Normal respiratory effort without tachypnea/retractions. Breath sounds are clear and equal bilaterally. No wheezes/rales/rhonchi. Gastrointestinal:   Soft and nontender. Non distended. There is  no CVA tenderness.  No rebound, rigidity, or guarding.  Musculoskeletal:   Normal range of motion in all extremities. No joint effusions.  No lower extremity tenderness.  No edema. Neurologic:   Normal speech and language.  CN 3-12 intact Motor grossly intact. No pronator drift No acute focal neurologic deficits are appreciated.  NIH stroke scale 0 Skin:    Skin is warm, dry and intact. No rash noted.  No petechiae, purpura, or bullae.  ____________________________________________    LABS (pertinent positives/negatives) (all labs ordered are listed, but only abnormal results are displayed) Labs Reviewed  COMPREHENSIVE METABOLIC PANEL - Abnormal; Notable for the following components:      Result Value   Potassium 3.4 (*)    CO2 21 (*)    Glucose, Bld 148 (*)    BUN 29 (*)    Calcium 8.8 (*)    All other components within normal limits  CBC WITH DIFFERENTIAL/PLATELET - Abnormal; Notable for the following components:   RBC 3.93 (*)    Hemoglobin 10.9 (*)    HCT 35.0 (*)    RDW 16.4 (*)    All other components within normal limits  URINALYSIS, COMPLETE (UACMP) WITH MICROSCOPIC - Abnormal; Notable for the following components:   Color, Urine YELLOW (*)    APPearance HAZY (*)    Hgb urine dipstick SMALL (*)    Bacteria, UA RARE (*)    Non Squamous Epithelial PRESENT (*)    All other components within normal limits  ETHANOL  URINE DRUG SCREEN, QUALITATIVE ([facility] ONLY)  TROPONIN I (HIGH SENSITIVITY)  TROPONIN I (HIGH SENSITIVITY)   ____________________________________________   EKG  Interpreted by me  Date: 09/26/2019  Rate: 99  Rhythm: normal sinus rhythm  QRS Axis: normal  Intervals: normal  ST/T Wave abnormalities: normal  Conduction Disutrbances: none  Narrative Interpretation: unremarkable      ____________________________________________    RADIOLOGY  Ct Head Wo Contrast  Result Date: 09/26/2019 CLINICAL DATA:  68 year old male with unexplained  altered mental status. Recently diagnosed with pneumonia. EXAM: CT HEAD WITHOUT CONTRAST TECHNIQUE: Contiguous axial images were obtained from the base of the skull through the vertex without intravenous contrast. COMPARISON:  Report of brain MRI 12/15/2002 (no images available). FINDINGS: Brain: Cerebral volume is within normal limits for age. No midline shift, ventriculomegaly, mass effect, evidence of mass lesion, intracranial hemorrhage or evidence of cortically based acute infarction. There is a small chronic infarcts suspected in the right cerebellum on series 2, image 10. Elsewhere normal gray-white matter differentiation, with no cerebral cortical encephalomalacia identified. Vascular: No suspicious intracranial vascular hyperdensity. Skull: No acute osseous abnormality identified. Sinuses/Orbits: Visualized paranasal sinuses and mastoids are clear. Other: Visualized orbits and scalp soft  tissues are within normal limits. IMPRESSION: 1. No acute intracranial abnormality identified. 2. Small chronic appearing right cerebellar infarct. Electronically Signed   By: Genevie Ann M.D.   On: 09/26/2019 20:42   Ct Angio Chest Pe W And/or Wo Contrast  Result Date: 09/26/2019 CLINICAL DATA:  68 year old male with unexplained altered mental status. Recently diagnosed with pneumonia. Chest tightness and shortness of breath. Prior lung surgery. EXAM: CT ANGIOGRAPHY CHEST WITH CONTRAST TECHNIQUE: Multidetector CT imaging of the chest was performed using the standard protocol during bolus administration of intravenous contrast. Multiplanar CT image reconstructions and MIPs were obtained to evaluate the vascular anatomy. CONTRAST:  84mL OMNIPAQUE IOHEXOL 350 MG/ML SOLN COMPARISON:  Portable chest 09/18/2019. Chest radiographs 02/14/2015. FINDINGS: Cardiovascular: Good contrast bolus timing in the pulmonary arterial tree. Staple line along the posterior right hilum. No focal filling defect identified in the pulmonary  arteries to suggest acute pulmonary embolism. Calcified coronary artery atherosclerosis on series 5, image 164. No cardiomegaly or pericardial effusion. Negative visible aorta aside from mild atherosclerosis. Mediastinum/Nodes: Staple line along the posterior right hilum. No mediastinal or hilar lymphadenopathy. Lungs/Pleura: Some of the right lower lobe appears to be surgically absent. The residual major airways are patent. In the right upper lobe there is a relatively large 4-5 centimeter area of consolidation peripherally with some air bronchograms and also some cavitary changes. See series 6, image 26, coronal series 7, images 53 and 57. Underlying centrilobular emphysema. Mild regional architectural distortion. There is a smaller 3 centimeter area of irregular peribronchial opacity in the peripheral left upper lobe with mild bronchiectasis and/or cavitation. See series 6, image 31 and series 7, image 39. Underlying centrilobular emphysema and some regional architectural distortion. Confluent subpleural scarring suspected in the lingula on series 6, image 60. There are nearby pleural calcifications. Superimposed dependent atelectasis greater on the left. Mild postoperative appearing right lower lobe architectural distortion with no suspicious features. No pleural effusion. Upper Abdomen: Negative visible liver, spleen, pancreas, adrenal glands, left kidney, and bowel in the upper abdomen. Musculoskeletal: Some postoperative changes to the right ribs. No acute or suspicious osseous lesion. Review of the MIP images confirms the above findings. IMPRESSION: 1. Negative for acute pulmonary embolus. 2. Confluent 5 cm area of right upper lobe consolidation some small cavitation is suspicious for a Necrotizing Pneumonia. Smaller peripheral left upper lobe area of opacity with bronchiectasis or cavitation also appears to be infectious/inflammatory. No pleural effusion. 3. Underlying Emphysema (ICD10-J43.9). Postoperative  changes to the right lower lobe with no adverse features. Lingula calcified pleural plaques with subjacent scarring. 4. Calcified coronary artery atherosclerosis. 5. Followup chest imaging is recommended in 3-4 weeks to ensure resolution and exclude underlying malignancy. Electronically Signed   By: Genevie Ann M.D.   On: 09/26/2019 20:50    ____________________________________________   PROCEDURES Procedures  ____________________________________________  DIFFERENTIAL DIAGNOSIS   Intracranial hemorrhage, stroke, pulmonary believes him, non-STEMI, pneumonia, pleurisy, pulmonary edema, pleural effusion  CLINICAL IMPRESSION / ASSESSMENT AND PLAN / ED COURSE  Medications ordered in the ED: Medications  sodium chloride 0.9 % bolus 500 mL (500 mLs Intravenous New Bag/Given 09/26/19 1934)  iohexol (OMNIPAQUE) 350 MG/ML injection 75 mL (75 mLs Intravenous Contrast Given 09/26/19 2018)    Pertinent labs & imaging results that were available during my care of the patient were reviewed by me and considered in my medical decision making (see chart for details).  Samuel Moore was evaluated in Emergency Department on 09/26/2019 for the symptoms described in the  history of present illness. He was evaluated in the context of the global COVID-19 pandemic, which necessitated consideration that the patient might be at risk for infection with the SARS-CoV-2 virus that causes COVID-19. Institutional protocols and algorithms that pertain to the evaluation of patients at risk for COVID-19 are in a state of rapid change based on information released by regulatory bodies including the CDC and federal and state organizations. These policies and algorithms were followed during the patient's care in the ED.   Patient presents with generalized weakness, chest tightness and shortness of breath.  Pain is atypical, unlikely to be ACS.  Doubt dissection.  With his lung cancer and mild tachycardia, worried for pulmonary  embolism and I will obtain a CT angiogram.  Clinical Course as of Sep 25 2226  Tue Sep 26, 2019  2222 Patient is feeling better, back to normal.  Similar to episode he had a month ago.  CTs are negative, no PE.  Does have a cavitary pneumonia, but is feeling better after a week of Augmentin, sats are normal, afebrile, white blood cell count normal, no evidence of any resistant pneumonia at this time.  Recommend follow-up with cardiology and neurology as previously arranged.   [PS]    Clinical Course User Index [PS] Carrie Mew, MD     ____________________________________________   FINAL CLINICAL IMPRESSION(S) / ED DIAGNOSES    Final diagnoses:  Generalized weakness  Shortness of breath     ED Discharge Orders    None      Portions of this note were generated with dragon dictation software. Dictation errors may occur despite best attempts at proofreading.   Carrie Mew, MD 09/26/19 2227

## 2019-09-26 NOTE — ED Triage Notes (Signed)
PT to ED via EMS. PT was dx with pneumonia 2 days ago and has been having weakness, chest tightness and SOB since. PT has finished prednisone and is still taking antibiotics. O2 stable on room air.

## 2020-03-01 ENCOUNTER — Emergency Department
Admission: EM | Admit: 2020-03-01 | Discharge: 2020-03-01 | Disposition: A | Discharge status: Home / Self Care | Payer: No Typology Code available for payment source | Attending: Emergency Medicine | Admitting: Emergency Medicine

## 2020-03-01 ENCOUNTER — Other Ambulatory Visit: Payer: Self-pay

## 2020-03-01 ENCOUNTER — Emergency Department: Payer: No Typology Code available for payment source

## 2020-03-01 DIAGNOSIS — Z87891 Personal history of nicotine dependence: Secondary | ICD-10-CM | POA: Insufficient documentation

## 2020-03-01 DIAGNOSIS — J441 Chronic obstructive pulmonary disease with (acute) exacerbation: Secondary | ICD-10-CM | POA: Diagnosis not present

## 2020-03-01 DIAGNOSIS — I10 Essential (primary) hypertension: Secondary | ICD-10-CM | POA: Insufficient documentation

## 2020-03-01 DIAGNOSIS — R0602 Shortness of breath: Secondary | ICD-10-CM | POA: Diagnosis present

## 2020-03-01 LAB — CBC WITH DIFFERENTIAL/PLATELET
Abs Immature Granulocytes: 0.04 10*3/uL (ref 0.00–0.07)
Basophils Absolute: 0 10*3/uL (ref 0.0–0.1)
Basophils Relative: 0 %
Eosinophils Absolute: 0 10*3/uL (ref 0.0–0.5)
Eosinophils Relative: 0 %
HCT: 35.6 % — ABNORMAL LOW (ref 39.0–52.0)
Hemoglobin: 10.8 g/dL — ABNORMAL LOW (ref 13.0–17.0)
Immature Granulocytes: 1 %
Lymphocytes Relative: 23 %
Lymphs Abs: 1.2 10*3/uL (ref 0.7–4.0)
MCH: 27.4 pg (ref 26.0–34.0)
MCHC: 30.3 g/dL (ref 30.0–36.0)
MCV: 90.4 fL (ref 80.0–100.0)
Monocytes Absolute: 0.3 10*3/uL (ref 0.1–1.0)
Monocytes Relative: 6 %
Neutro Abs: 3.8 10*3/uL (ref 1.7–7.7)
Neutrophils Relative %: 70 %
Platelets: 131 10*3/uL — ABNORMAL LOW (ref 150–400)
RBC: 3.94 MIL/uL — ABNORMAL LOW (ref 4.22–5.81)
RDW: 18.4 % — ABNORMAL HIGH (ref 11.5–15.5)
WBC: 5.4 10*3/uL (ref 4.0–10.5)
nRBC: 0 % (ref 0.0–0.2)

## 2020-03-01 LAB — BASIC METABOLIC PANEL
Anion gap: 8 (ref 5–15)
BUN: 20 mg/dL (ref 8–23)
CO2: 25 mmol/L (ref 22–32)
Calcium: 8.4 mg/dL — ABNORMAL LOW (ref 8.9–10.3)
Chloride: 103 mmol/L (ref 98–111)
Creatinine, Ser: 1.16 mg/dL (ref 0.61–1.24)
GFR calc Af Amer: >60 mL/min (ref >60)
GFR calc non Af Amer: >60 mL/min (ref >60)
Glucose, Bld: 142 mg/dL — ABNORMAL HIGH (ref 70–99)
Potassium: 3.8 mmol/L (ref 3.5–5.1)
Sodium: 136 mmol/L (ref 135–145)

## 2020-03-01 LAB — BRAIN NATRIURETIC PEPTIDE: B Natriuretic Peptide: 18 pg/mL (ref 0.0–100.0)

## 2020-03-01 LAB — TROPONIN I (HIGH SENSITIVITY): Troponin I (High Sensitivity): 4 ng/L (ref <18)

## 2020-03-01 IMAGING — DX DG CHEST 1V PORT
2 series · 2 of 2 positions shown · non-contrast
Comparison: Chest x-ray [DATE].

CLINICAL DATA: 68-year-old male with history of shortness of
breath. Difficulty breathing.

EXAM:
PORTABLE CHEST 1 VIEW

[chest ap (1 of 2)]
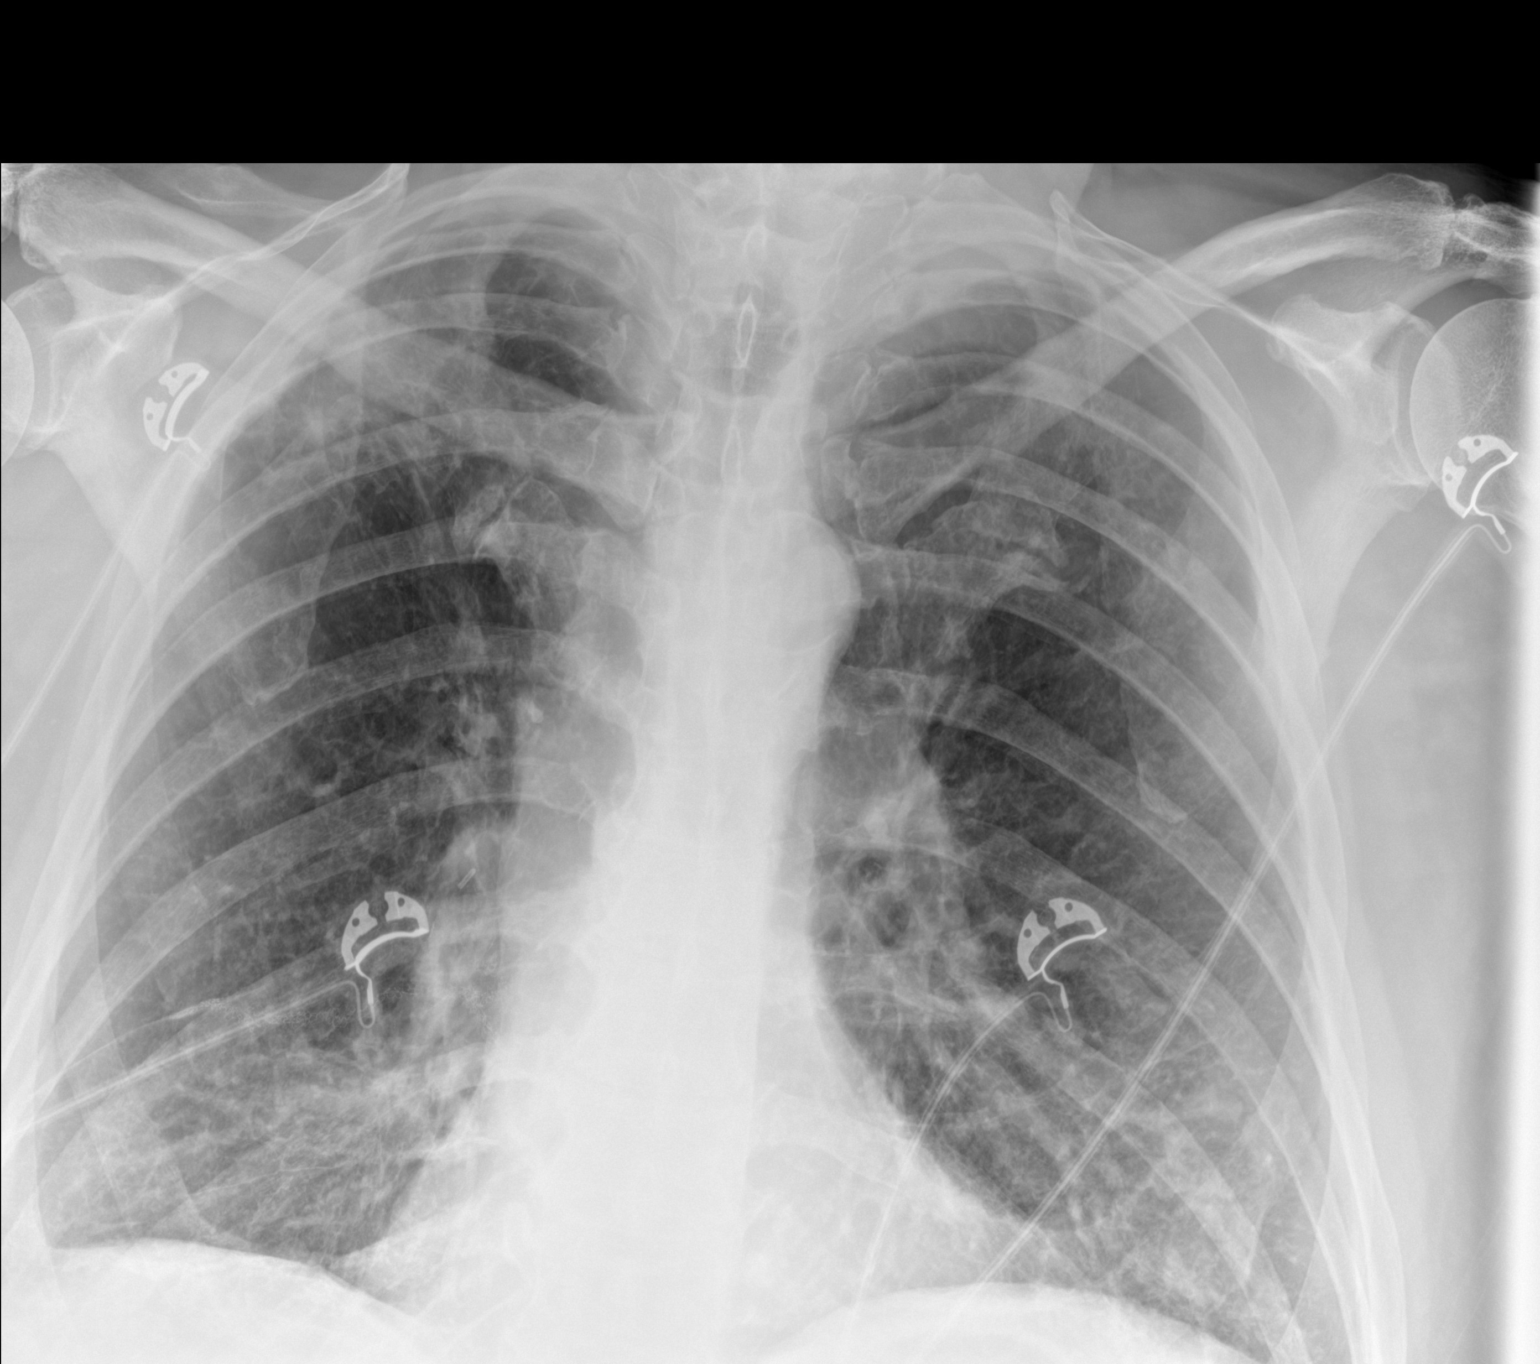

[chest ap (2 of 2)]
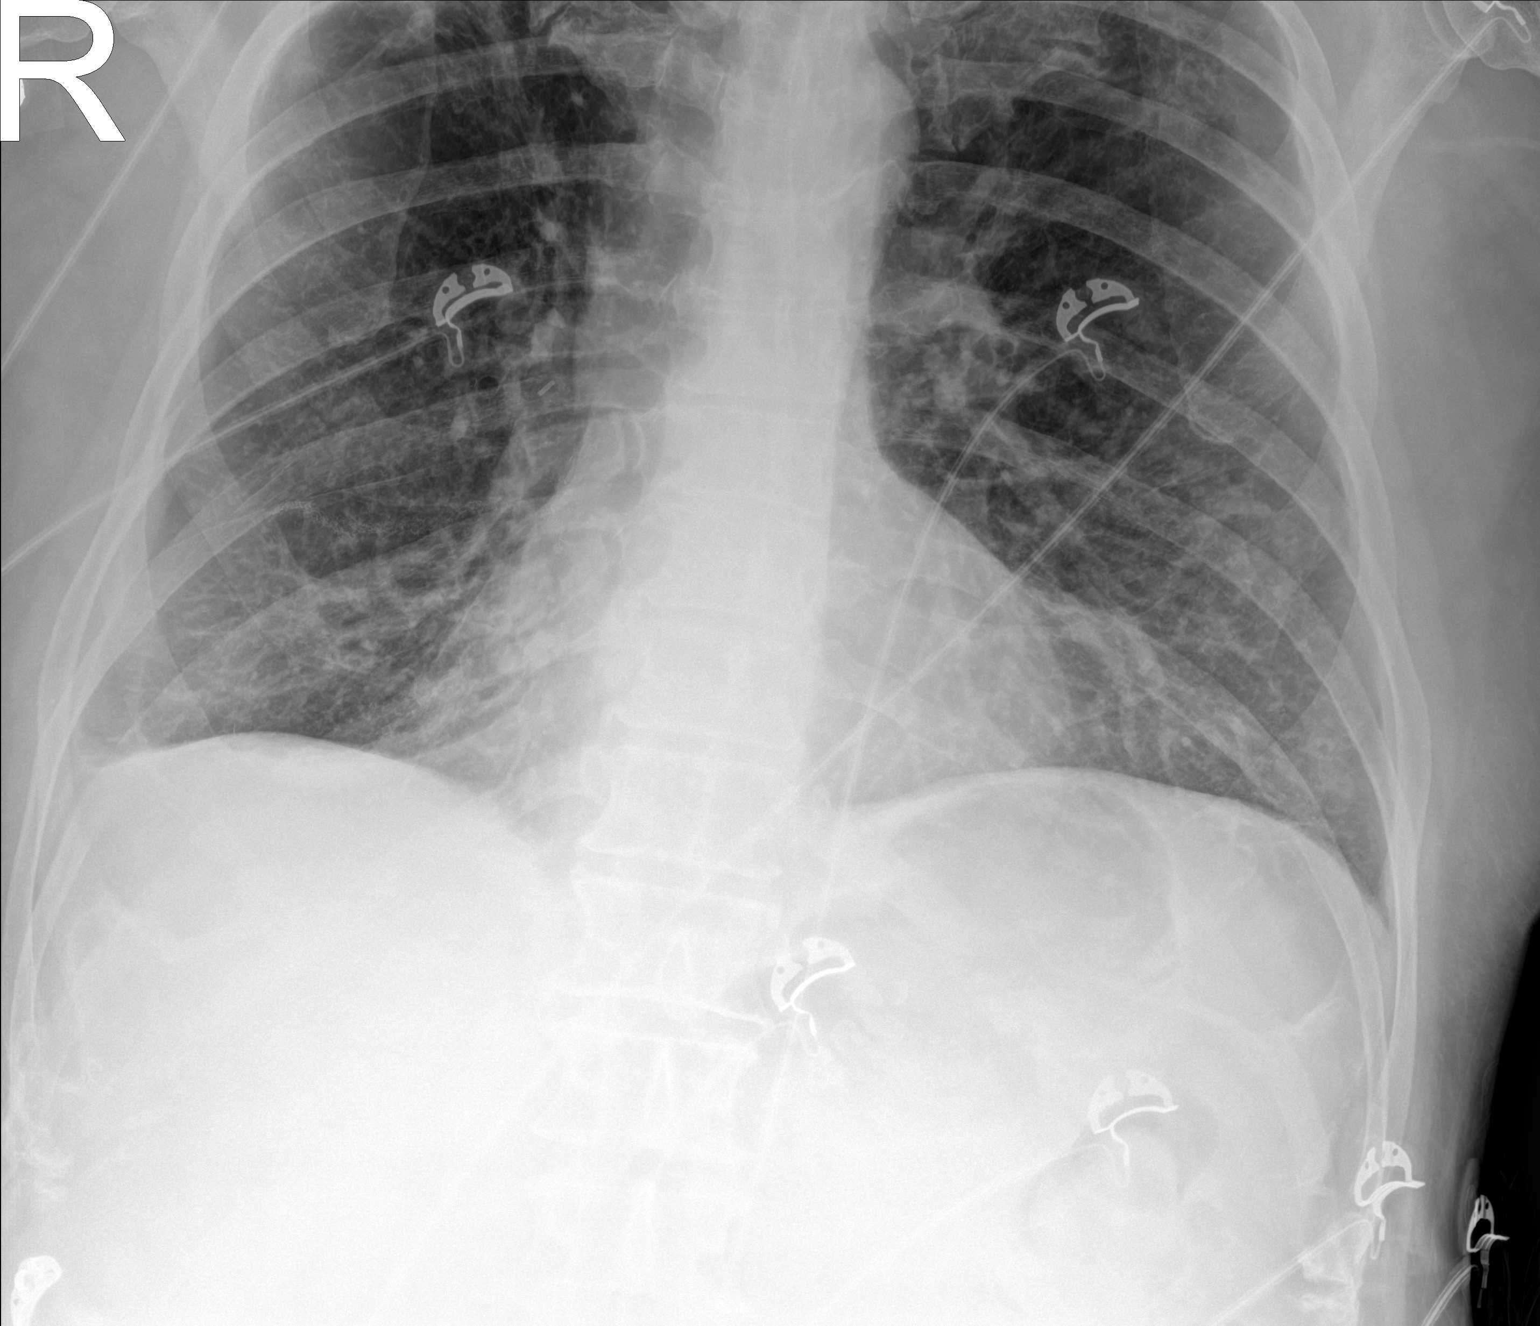

[2 of 2 positions shown; findings below may reference images not displayed]

FINDINGS: Mass-like opacity in the apex of the right hemithorax. Left lung is
clear. No pleural effusions. No evidence of pulmonary edema. Heart
size is normal. Upper mediastinal contours are within normal limits.
Aortic atherosclerosis.
IMPRESSION: 1. Mass-like opacity in the apex of the right upper lobe. This could
be infectious in etiology, however, underlying neoplasm is difficult
to exclude. Further evaluation with contrast enhanced chest CT is
recommended in the near future to better evaluate this finding.
2. Aortic atherosclerosis.

## 2020-03-01 MED ORDER — PREDNISONE 20 MG PO TABS: 60.0000 mg | ORAL_TABLET | Freq: Every day | ORAL | 0 refills | Status: AC

## 2020-03-01 NOTE — ED Provider Notes (Signed)
[facility] Emergency Department Provider Note ____________________________________________   First MD Initiated Contact with Patient 03/01/20 1843     (approximate)  I have reviewed the triage vital signs and the nursing notes.   HISTORY  Chief Complaint Shortness of Breath    HPI Samuel Moore is a 69 y.o. male with PMH as noted below including lung cancer and COPD who presents with acute onset of shortness of breath this afternoon, now improved after DuoNeb's and Solu-Medrol by EMS, and associated with chest tightness and cough.  The patient denies any vomiting or diarrhea.  He has had no fevers or chills.  The patient states that he felt fine yesterday and earlier today.  Past Medical History:  Diagnosis Date  . Cancer ([place])   . COPD (chronic obstructive pulmonary disease) ([place])   . Hypertension     There are no problems to display for this patient.   Past Surgical History:  Procedure Laterality Date  . BLADDER REMOVAL    . LOBECTOMY    . prostate     removed    Prior to Admission medications   Medication Sig Start Date End Date Taking? Authorizing Provider  predniSONE (DELTASONE) 20 MG tablet Take 3 tablets (60 mg total) by mouth daily for 5 days. Start the day after the ER visit 03/02/20 03/07/20  Arta Silence, MD    Allergies Patient has no known allergies.  History reviewed. No pertinent family history.  Social History Social History   Tobacco Use  . Smoking status: Former Smoker    Packs/day: 2.00    Years: 10.00    Pack years: 20.00    Types: Cigarettes    Quit date: 12/14/2002    Years since quitting: 17.2  . Smokeless tobacco: Never Used  Substance Use Topics  . Alcohol use: No  . Drug use: No    Review of Systems  Constitutional: No fever. Eyes: No redness. ENT: No sore throat. Cardiovascular: Denies chest pain. Respiratory: Positive for shortness of breath. Gastrointestinal: No vomiting or diarrhea.   Genitourinary: Negative for dysuria.  Musculoskeletal: Negative for back pain. Skin: Negative for rash. Neurological: Negative for headache.   ____________________________________________   PHYSICAL EXAM:  VITAL SIGNS: ED Triage Vitals  Enc Vitals Group     BP 03/01/20 1840 126/65     Pulse Rate 03/01/20 1840 98     Resp 03/01/20 1840 (!) 21     Temp 03/01/20 1840 97.9 F (36.6 C)     Temp Source 03/01/20 1840 Oral     SpO2 03/01/20 1840 100 %     Weight 03/01/20 1837 200 lb (90.7 kg)     Height 03/01/20 1837 6' (1.829 m)     Head Circumference --      Peak Flow --      Pain Score 03/01/20 1837 5     Pain Loc --      Pain Edu? --      Excl. in [place]? --     Constitutional: Alert and oriented.  Relatively well appearing, and in no acute distress. Eyes: Conjunctivae are normal.  Head: Atraumatic. Nose: No congestion/rhinnorhea. Mouth/Throat: Mucous membranes are moist.   Neck: Normal range of motion.  Cardiovascular: Normal rate, regular rhythm. Grossly normal heart sounds.  Good peripheral circulation. Respiratory: Slightly increased respiratory effort.  No retractions.  Decreased breath sounds bilaterally with some faint wheezes. Gastrointestinal: Soft and nontender. No distention.  Genitourinary: No flank tenderness. Musculoskeletal: No lower extremity  edema.  Extremities warm and well perfused.  Neurologic:  Normal speech and language. No gross focal neurologic deficits are appreciated.  Skin:  Skin is warm and dry. No rash noted. Psychiatric: Mood and affect are normal. Speech and behavior are normal.  ____________________________________________   LABS (all labs ordered are listed, but only abnormal results are displayed)  Labs Reviewed  BASIC METABOLIC PANEL - Abnormal; Notable for the following components:      Result Value   Glucose, Bld 142 (*)    Calcium 8.4 (*)    All other components within normal limits  CBC WITH DIFFERENTIAL/PLATELET - Abnormal;  Notable for the following components:   RBC 3.94 (*)    Hemoglobin 10.8 (*)    HCT 35.6 (*)    RDW 18.4 (*)    Platelets 131 (*)    All other components within normal limits  BRAIN NATRIURETIC PEPTIDE  TROPONIN I (HIGH SENSITIVITY)   ____________________________________________  EKG  ED ECG REPORT I, Arta Silence, the attending physician, personally viewed and interpreted this ECG.  Date: 03/01/2020 EKG Time: 1845 Rate: 99 Rhythm: normal sinus rhythm QRS Axis: normal Intervals: normal ST/T Wave abnormalities: normal Narrative Interpretation: no evidence of acute ischemia  ____________________________________________  RADIOLOGY  CXR: Right upper lobe opacity, possible mass  ____________________________________________   PROCEDURES  Procedure(s) performed: No  Procedures  Critical Care performed: No ____________________________________________   INITIAL IMPRESSION / ASSESSMENT AND PLAN / ED COURSE  Pertinent labs & imaging results that were available during my care of the patient were reviewed by me and considered in my medical decision making (see chart for details).  69 year old male with PMH as noted above including a history of lung cancer and COPD presents with acute onset of shortness of breath, cough, chest tightness this afternoon.  He was given duo nebs and Solu-Medrol by EMS and improved significantly.  I reviewed the past medical records in [place].  The patient had a few ED visits last October where he was treated and released for chest pain and shortness of breath with a reassuring work-up.  The patient reports that he follows at the [facility] for the lung cancer and had a CT earlier today, but I am unable to see the [place] records and care everywhere.  On exam currently the patient is relatively well-appearing.  He has minimally increased work of breathing but is in no respiratory distress and is able to speak in full sentences.  He has decreased breath  sounds and some faint wheezing bilaterally.  He is afebrile with otherwise normal vital signs.  There is no peripheral edema.  Overall I suspect most likely acute COPD exacerbation.  Differential includes pneumonia, acute bronchitis, COVID-19.  We will obtain a chest x-ray, lab work-up, and reassess.  ----------------------------------------- 10:57 PM on 03/01/2020 -----------------------------------------  The patient continued to feel well after the initial treatments.  O2 saturation remained in the high 90s on room air.  His other vital signs remained stable.  Lab work-up was unremarkable.  Initial troponin was negative.  Given the lack of chest pain or EKG changes, I do not suspect ACS so there is no indication for a repeat.  Chest x-ray showed a right upper lung opacity consistent with a possible mass versus less likely infiltrate.  Given that the patient has no fever, leukocytosis, or other signs or symptoms of pneumonia, I do not suspect an acute infiltrate.  Since the patient had a CT performed at the [place] today, he has already had  the appropriate work-up for this (although I do not have access to the CT result).  I counseled the patient on the results of the work-up including the chest x-ray findings.  He felt comfortable and wanted to go home.  I prescribed prednisone.  The patient has albuterol at home.  Return precautions given, and he expressed understanding.  ____________________________________________   FINAL CLINICAL IMPRESSION(S) / ED DIAGNOSES  Final diagnoses:  COPD exacerbation ([place])      NEW MEDICATIONS STARTED DURING THIS VISIT:  Discharge Medication List as of 03/01/2020  8:56 PM    START taking these medications   Details  predniSONE (DELTASONE) 20 MG tablet Take 3 tablets (60 mg total) by mouth daily for 5 days. Start the day after the ER visit, Starting Sat 03/02/2020, Until Thu 03/07/2020, Normal         Note:  This document was prepared using Dragon voice  recognition software and may include unintentional dictation errors.   Arta Silence, MD 03/01/20 2317

## 2020-03-01 NOTE — ED Notes (Signed)
Patient requesting to see MD. MD notified. MD informed he will update patient shortly. Patient informed and waiting comfortably in room with spouse.

## 2020-03-01 NOTE — ED Notes (Signed)
This RN removed 18 gauge IV for left forearm. Catheter tip intact. Site clean, no bleeding seen.

## 2020-03-01 NOTE — Discharge Instructions (Addendum)
Take the prednisone as prescribed for the next 5 days.  Use albuterol either via inhaler or nebulizer every 4-6 hours for the next several days.  Follow-up with your doctors at the [place] to discuss the results of your CT scan that was obtained today.  Return to the ER immediately for new, worsening, or persistent severe shortness of breath, chest pain, weakness or lightheadedness, or any other new or worsening symptoms that concern you.

## 2020-03-01 NOTE — ED Triage Notes (Signed)
Pt arrives vis [facility] from home for sudden onset of breathing difficulty. Pt A&Ox4 and in NAD at this time. Pt received 1 duoneb, 1 albuterol, 125 mg solumedrol in route. 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 O2 98% on 6L [place] per EMS. Pt has hx lung cancer and COPD. Pt speaking in complete sentences but does have some SHOB when forming long sentences

## 2020-03-01 NOTE — ED Notes (Addendum)
Patient resting comfortably

## 2020-03-01 NOTE — ED Notes (Signed)
Reviewed discharge instructions, follow-up care, and prescriptions with patient. Patient verbalized understanding of all information reviewed. Patient stable, with no distress noted at this time.    

## 2020-03-01 NOTE — ED Notes (Signed)
Report to Jenna, RN  

## 2020-05-01 ENCOUNTER — Emergency Department: Payer: Non-veteran care

## 2020-05-01 ENCOUNTER — Emergency Department
Admission: EM | Admit: 2020-05-01 | Discharge: 2020-05-01 | Disposition: A | Discharge status: Home / Self Care | Payer: Non-veteran care | Attending: Emergency Medicine | Admitting: Emergency Medicine

## 2020-05-01 ENCOUNTER — Other Ambulatory Visit: Payer: Self-pay

## 2020-05-01 DIAGNOSIS — R079 Chest pain, unspecified: Secondary | ICD-10-CM | POA: Insufficient documentation

## 2020-05-01 DIAGNOSIS — Z5321 Procedure and treatment not carried out due to patient leaving prior to being seen by health care provider: Secondary | ICD-10-CM | POA: Insufficient documentation

## 2020-05-01 LAB — COMPREHENSIVE METABOLIC PANEL
ALT: 13 U/L (ref 0–44)
AST: 15 U/L (ref 15–41)
Albumin: 4 g/dL (ref 3.5–5.0)
Alkaline Phosphatase: 71 U/L (ref 38–126)
Anion gap: 7 (ref 5–15)
BUN: 22 mg/dL (ref 8–23)
CO2: 22 mmol/L (ref 22–32)
Calcium: 8.9 mg/dL (ref 8.9–10.3)
Chloride: 108 mmol/L (ref 98–111)
Creatinine, Ser: 1.38 mg/dL — ABNORMAL HIGH (ref 0.61–1.24)
GFR calc Af Amer: 60 mL/min — ABNORMAL LOW (ref >60)
GFR calc non Af Amer: 51 mL/min — ABNORMAL LOW (ref >60)
Glucose, Bld: 132 mg/dL — ABNORMAL HIGH (ref 70–99)
Potassium: 4.5 mmol/L (ref 3.5–5.1)
Sodium: 137 mmol/L (ref 135–145)
Total Bilirubin: 0.6 mg/dL (ref 0.3–1.2)
Total Protein: 7.3 g/dL (ref 6.5–8.1)

## 2020-05-01 LAB — CBC
HCT: 36.2 % — ABNORMAL LOW (ref 39.0–52.0)
Hemoglobin: 11.1 g/dL — ABNORMAL LOW (ref 13.0–17.0)
MCH: 27.5 pg (ref 26.0–34.0)
MCHC: 30.7 g/dL (ref 30.0–36.0)
MCV: 89.6 fL (ref 80.0–100.0)
Platelets: 129 10*3/uL — ABNORMAL LOW (ref 150–400)
RBC: 4.04 MIL/uL — ABNORMAL LOW (ref 4.22–5.81)
RDW: 18.1 % — ABNORMAL HIGH (ref 11.5–15.5)
WBC: 5.3 10*3/uL (ref 4.0–10.5)
nRBC: 0 % (ref 0.0–0.2)

## 2020-05-01 LAB — TROPONIN I (HIGH SENSITIVITY): Troponin I (High Sensitivity): 5 ng/L (ref <18)

## 2020-05-01 IMAGING — CR DG CHEST 2V
1 series · 2 of 2 positions shown · non-contrast
Comparison: [DATE], [DATE]

CLINICAL DATA: Chest pain and shortness of breath for 1 day, COPD

EXAM:
CHEST - 2 VIEW

[Series 1: dg chest 2 view · 0.14mm/px · 2 of 2 slices shown]
[im 1/2]
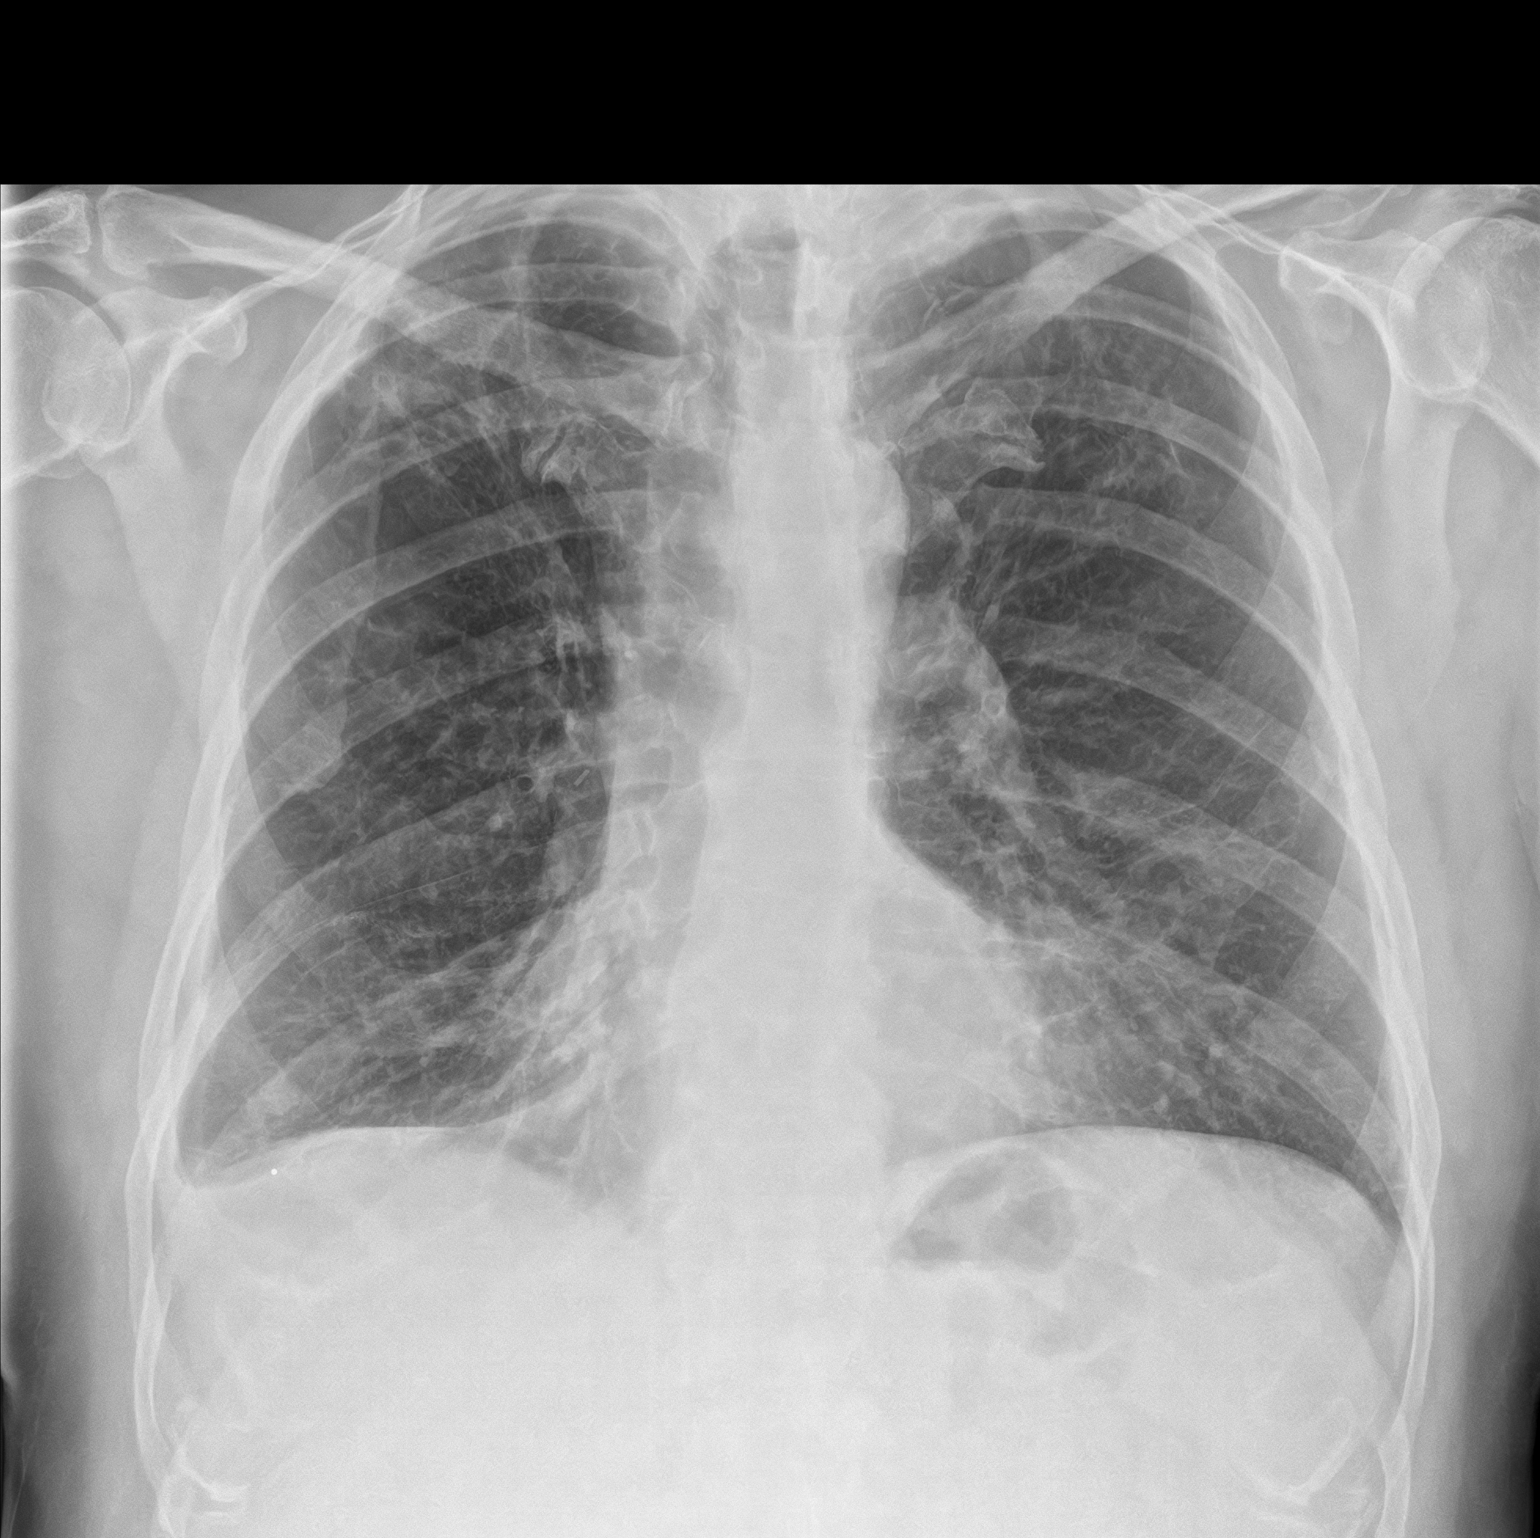
[im 2/2]
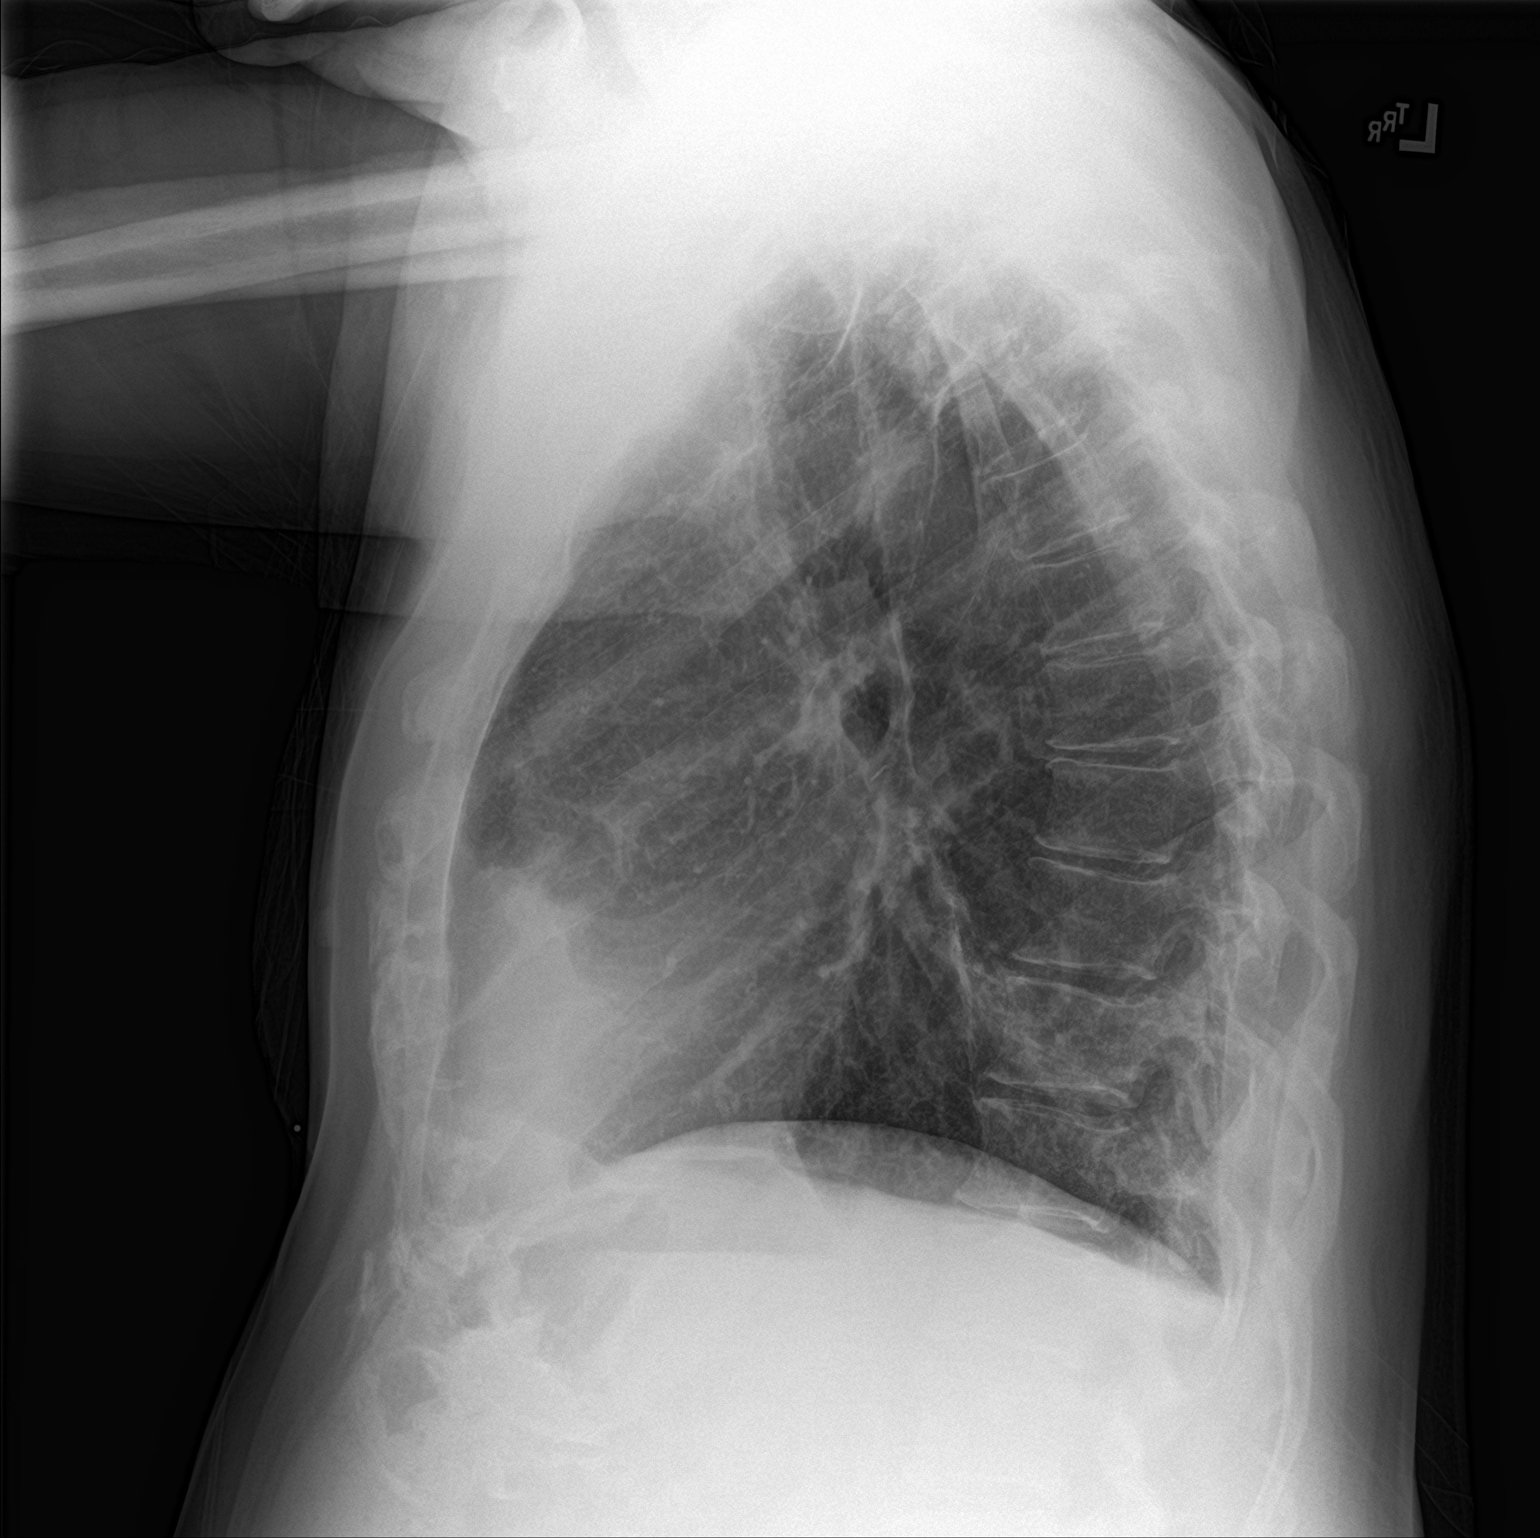

[2 of 2 positions shown; findings below may reference images not displayed]

FINDINGS: Frontal and lateral views of the chest demonstrates a stable cardiac
silhouette. There is chronic background emphysema. Consolidation
within the right apex has improved since [DATE] and [DATE],
with minimal residual consolidation. No new areas of airspace
disease. Chronic blunting of the right costophrenic angle. No
pneumothorax. No acute bony abnormalities.
IMPRESSION: 1. Significant improvement in the right upper lobe consolidation
seen on previous studies, with minimal residual density as above.
While this could reflect scarring, CT may be useful to exclude
underlying mass or residual infection.
2. Emphysema.

## 2020-05-01 NOTE — ED Triage Notes (Signed)
Pt brought in by wife with co shob and chest pain that started today. Does have a hx of copd, no fever. Does have a nonproductive cough, was dx recently with UTI.

## 2020-05-01 NOTE — ED Notes (Signed)
Spoke with wife who stated she was going to drive pt to the [place] where he was seen last night.

## 2020-05-03 ENCOUNTER — Telehealth: Payer: Self-pay | Admitting: Emergency Medicine

## 2020-05-03 NOTE — Telephone Encounter (Signed)
Called patient due to lwot to inquire about condition and follow up plans. Left message.   

## 2020-10-26 ENCOUNTER — Emergency Department (HOSPITAL_COMMUNITY): Payer: No Typology Code available for payment source

## 2020-10-26 ENCOUNTER — Encounter (HOSPITAL_COMMUNITY): Payer: Self-pay | Admitting: Neurology

## 2020-10-26 ENCOUNTER — Other Ambulatory Visit: Payer: Self-pay

## 2020-10-26 ENCOUNTER — Observation Stay (HOSPITAL_COMMUNITY)
Admission: EM | Admit: 2020-10-26 | Discharge: 2020-10-27 | Disposition: A | Discharge status: Home / Self Care | Payer: No Typology Code available for payment source | Attending: Family Medicine | Admitting: Family Medicine

## 2020-10-26 DIAGNOSIS — Z79899 Other long term (current) drug therapy: Secondary | ICD-10-CM | POA: Diagnosis not present

## 2020-10-26 DIAGNOSIS — Z87891 Personal history of nicotine dependence: Secondary | ICD-10-CM | POA: Diagnosis not present

## 2020-10-26 DIAGNOSIS — I10 Essential (primary) hypertension: Secondary | ICD-10-CM | POA: Diagnosis not present

## 2020-10-26 DIAGNOSIS — Z20822 Contact with and (suspected) exposure to covid-19: Secondary | ICD-10-CM | POA: Insufficient documentation

## 2020-10-26 DIAGNOSIS — G459 Transient cerebral ischemic attack, unspecified: Principal | ICD-10-CM | POA: Insufficient documentation

## 2020-10-26 DIAGNOSIS — Z7982 Long term (current) use of aspirin: Secondary | ICD-10-CM | POA: Insufficient documentation

## 2020-10-26 DIAGNOSIS — R5383 Other fatigue: Secondary | ICD-10-CM | POA: Diagnosis present

## 2020-10-26 DIAGNOSIS — J449 Chronic obstructive pulmonary disease, unspecified: Secondary | ICD-10-CM | POA: Insufficient documentation

## 2020-10-26 LAB — I-STAT CHEM 8, ED
BUN: 20 mg/dL (ref 8–23)
Calcium, Ion: 1.16 mmol/L (ref 1.15–1.40)
Chloride: 104 mmol/L (ref 98–111)
Creatinine, Ser: 1.4 mg/dL — ABNORMAL HIGH (ref 0.61–1.24)
Glucose, Bld: 106 mg/dL — ABNORMAL HIGH (ref 70–99)
HCT: 34 % — ABNORMAL LOW (ref 39.0–52.0)
Hemoglobin: 11.6 g/dL — ABNORMAL LOW (ref 13.0–17.0)
Potassium: 4.2 mmol/L (ref 3.5–5.1)
Sodium: 140 mmol/L (ref 135–145)
TCO2: 23 mmol/L (ref 22–32)

## 2020-10-26 LAB — COMPREHENSIVE METABOLIC PANEL
ALT: 13 U/L (ref 0–44)
AST: 15 U/L (ref 15–41)
Albumin: 3.5 g/dL (ref 3.5–5.0)
Alkaline Phosphatase: 70 U/L (ref 38–126)
Anion gap: 8 (ref 5–15)
BUN: 18 mg/dL (ref 8–23)
CO2: 24 mmol/L (ref 22–32)
Calcium: 8.8 mg/dL — ABNORMAL LOW (ref 8.9–10.3)
Chloride: 106 mmol/L (ref 98–111)
Creatinine, Ser: 1.37 mg/dL — ABNORMAL HIGH (ref 0.61–1.24)
GFR, Estimated: 55 mL/min — ABNORMAL LOW (ref >60)
Glucose, Bld: 114 mg/dL — ABNORMAL HIGH (ref 70–99)
Potassium: 4.3 mmol/L (ref 3.5–5.1)
Sodium: 138 mmol/L (ref 135–145)
Total Bilirubin: 0.5 mg/dL (ref 0.3–1.2)
Total Protein: 6.6 g/dL (ref 6.5–8.1)

## 2020-10-26 LAB — CBC
HCT: 35.1 % — ABNORMAL LOW (ref 39.0–52.0)
Hemoglobin: 10.1 g/dL — ABNORMAL LOW (ref 13.0–17.0)
MCH: 25.7 pg — ABNORMAL LOW (ref 26.0–34.0)
MCHC: 28.8 g/dL — ABNORMAL LOW (ref 30.0–36.0)
MCV: 89.3 fL (ref 80.0–100.0)
Platelets: 59 10*3/uL — ABNORMAL LOW (ref 150–400)
RBC: 3.93 MIL/uL — ABNORMAL LOW (ref 4.22–5.81)
RDW: 18.5 % — ABNORMAL HIGH (ref 11.5–15.5)
WBC: 3.4 10*3/uL — ABNORMAL LOW (ref 4.0–10.5)
nRBC: 0 % (ref 0.0–0.2)

## 2020-10-26 LAB — PROTIME-INR
INR: 1.1 (ref 0.8–1.2)
Prothrombin Time: 13.7 seconds (ref 11.4–15.2)

## 2020-10-26 LAB — DIFFERENTIAL
Abs Immature Granulocytes: 0 10*3/uL (ref 0.00–0.07)
Basophils Absolute: 0 10*3/uL (ref 0.0–0.1)
Basophils Relative: 0 %
Eosinophils Absolute: 0 10*3/uL (ref 0.0–0.5)
Eosinophils Relative: 1 %
Lymphocytes Relative: 25 %
Lymphs Abs: 0.9 10*3/uL (ref 0.7–4.0)
Monocytes Absolute: 0.2 10*3/uL (ref 0.1–1.0)
Monocytes Relative: 6 %
Neutro Abs: 2.3 10*3/uL (ref 1.7–7.7)
Neutrophils Relative %: 68 %

## 2020-10-26 LAB — APTT: aPTT: 27 seconds (ref 24–36)

## 2020-10-26 LAB — CBG MONITORING, ED: Glucose-Capillary: 102 mg/dL — ABNORMAL HIGH (ref 70–99)

## 2020-10-26 IMAGING — CT CT HEAD CODE STROKE
4 series · 16 of 47 positions shown, 18 images · non-contrast
Comparison: [DATE]

CLINICAL DATA: Code stroke.  Left-sided weakness.

EXAM:
CT HEAD WITHOUT CONTRAST
TECHNIQUE: Contiguous axial images were obtained from the base of the skull
through the vertex without intravenous contrast.

[Series 3: head wo · axial · 0.52mm/px · z∈[-111,+14]mm · 7 of 35 slices shown, 9 images]
[im 5/35  brain]
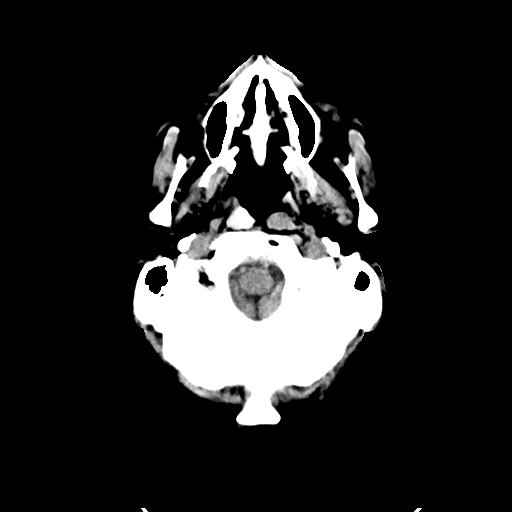
[im 5/35  bone]
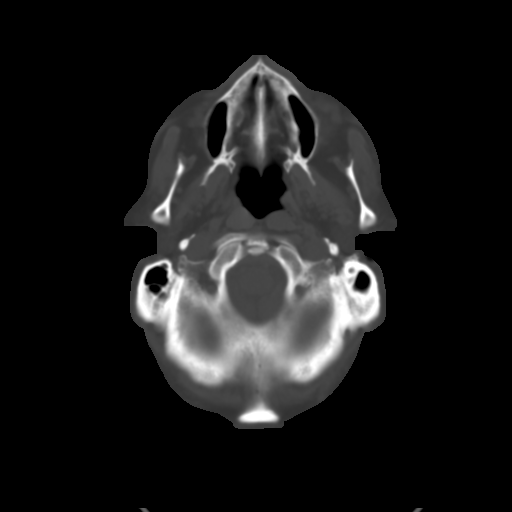
[im 9/35  brain]
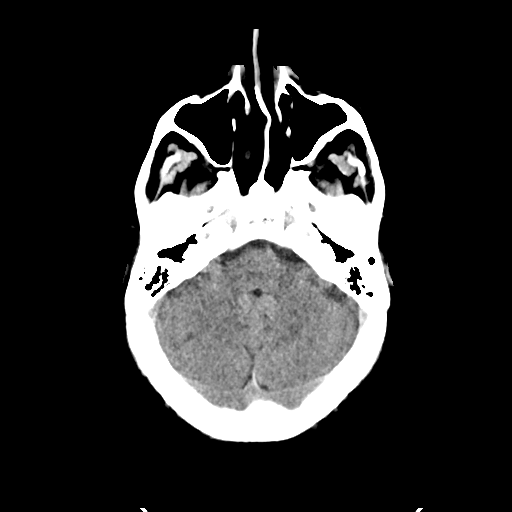
[im 13/35  brain]
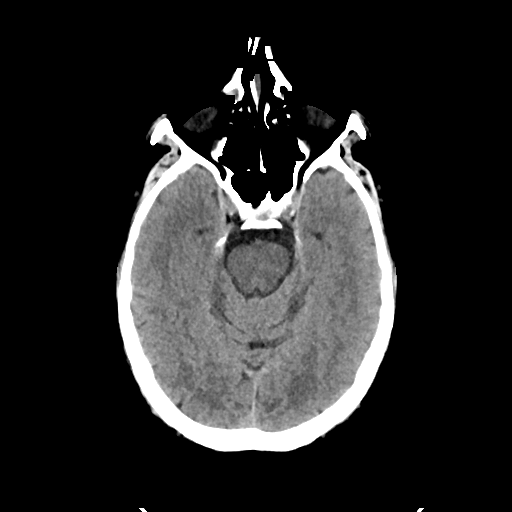
[im 18/35  brain]
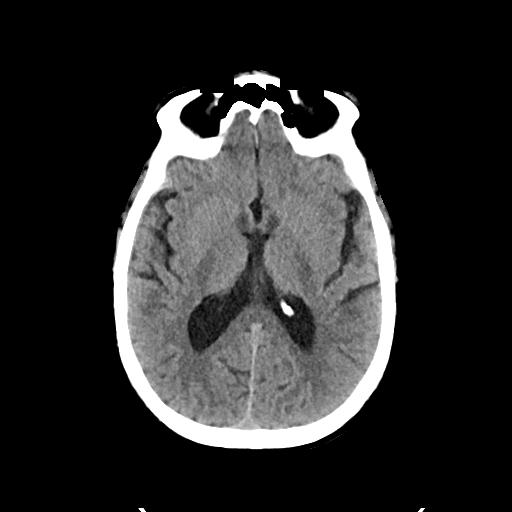
[im 22/35  brain]
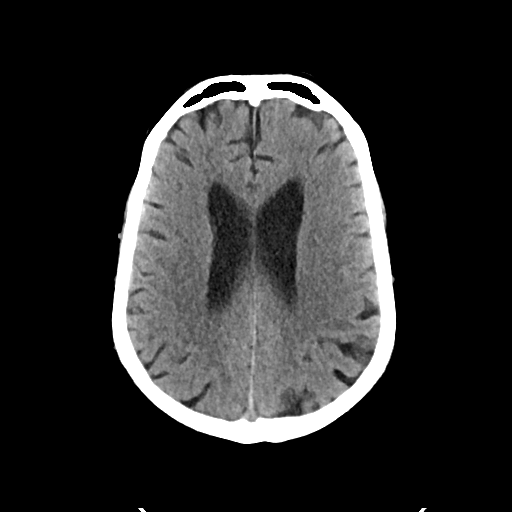
[im 22/35  bone]
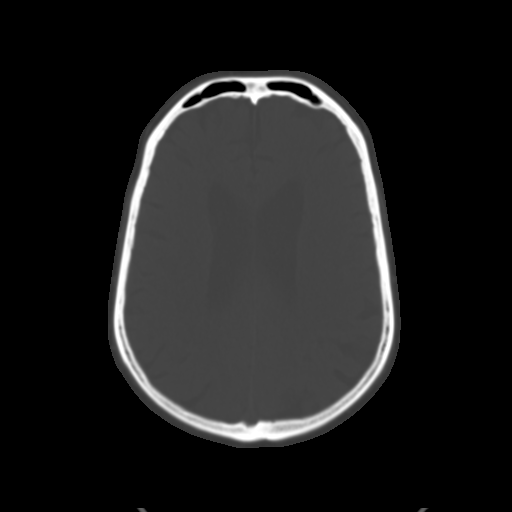
[im 26/35  brain]
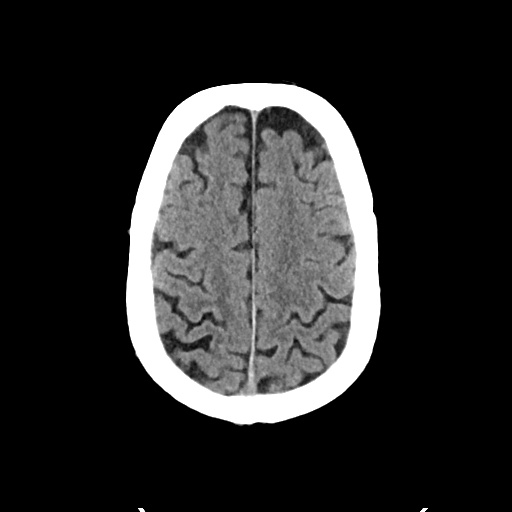
[im 30/35  brain]
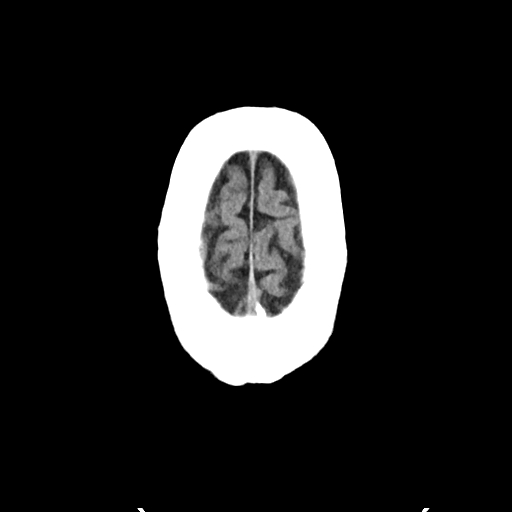

[Series 4: head bone · axial · 0.52mm/px · z∈[-115,-81]mm · 3 of 86 slices shown]
[im 9/86  bone]
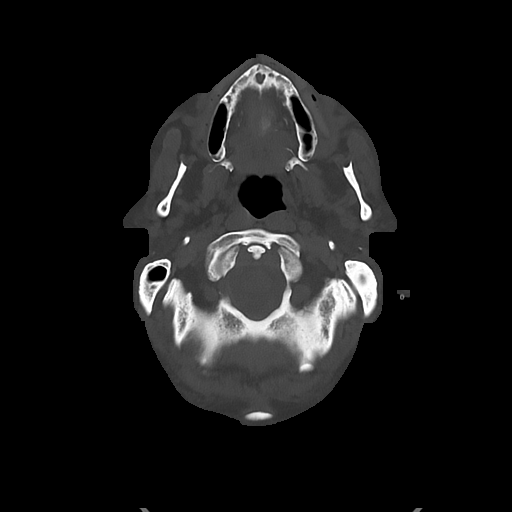
[im 18/86  bone]
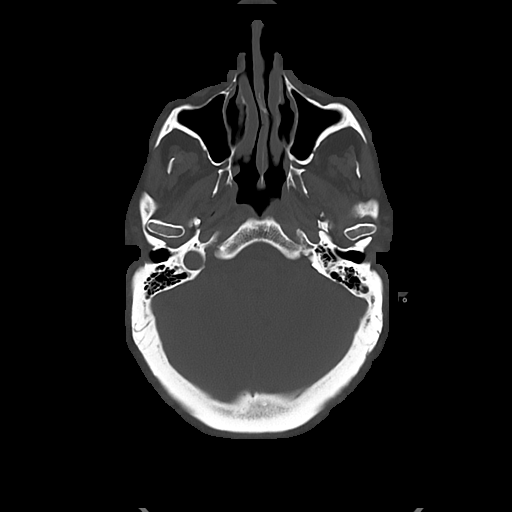
[im 26/86  bone]
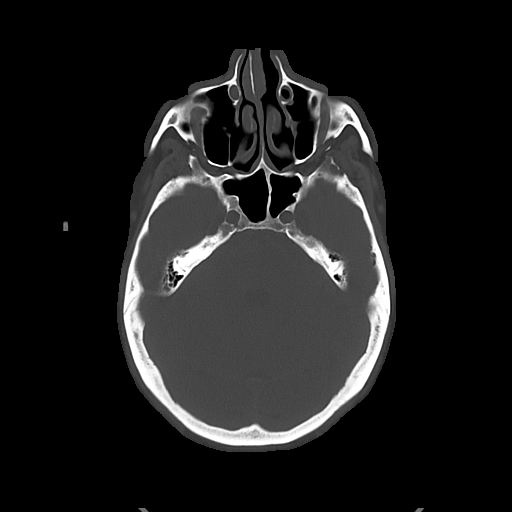

[Series 5: cor soft · coronal · 0.35mm/px · 3 of 76 slices shown]
[im 26/76  brain]
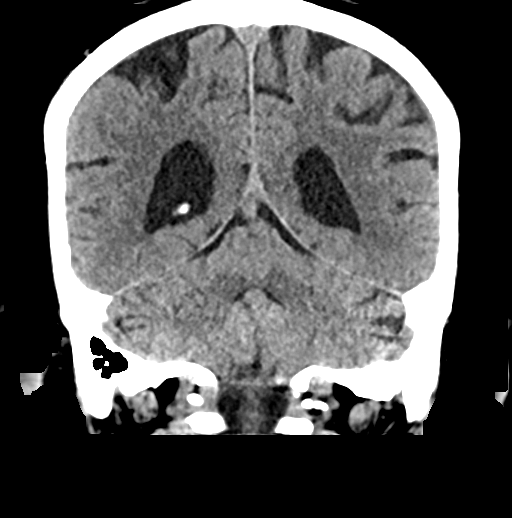
[im 34/76  brain]
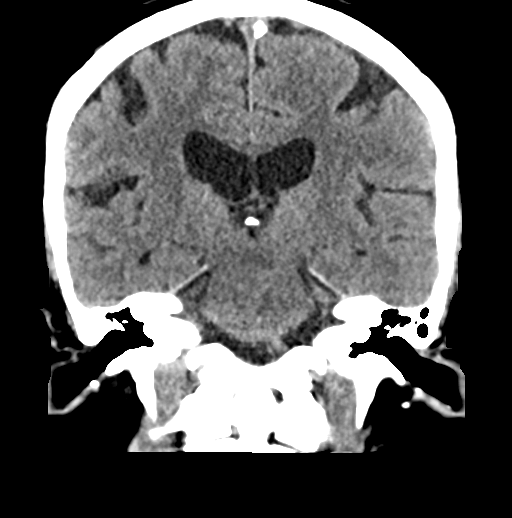
[im 42/76  brain]
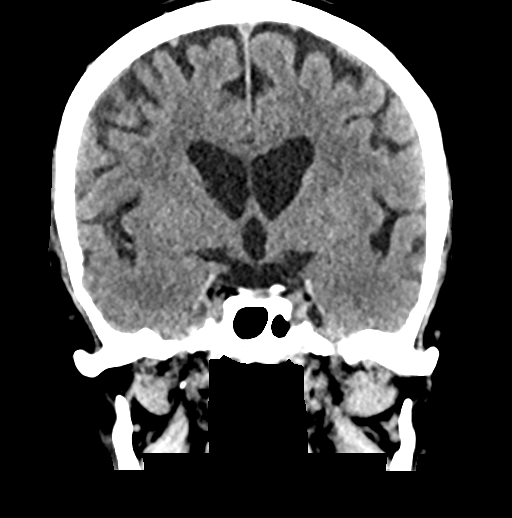

[Series 6: sag soft · sagittal · 0.36mm/px · 3 of 60 slices shown]
[im 20/60  brain]
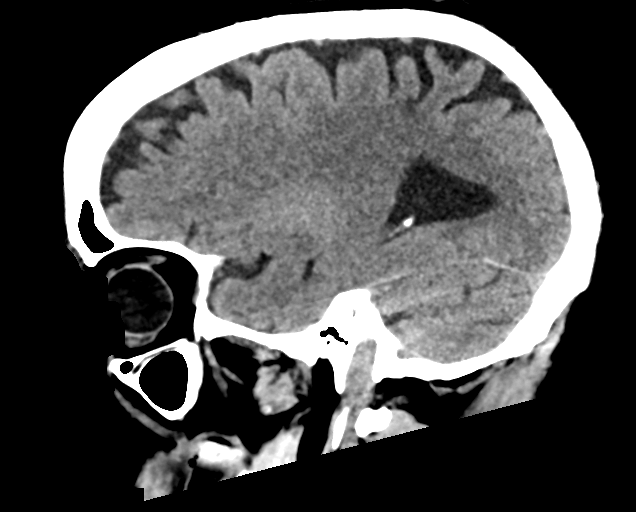
[im 30/60  brain]
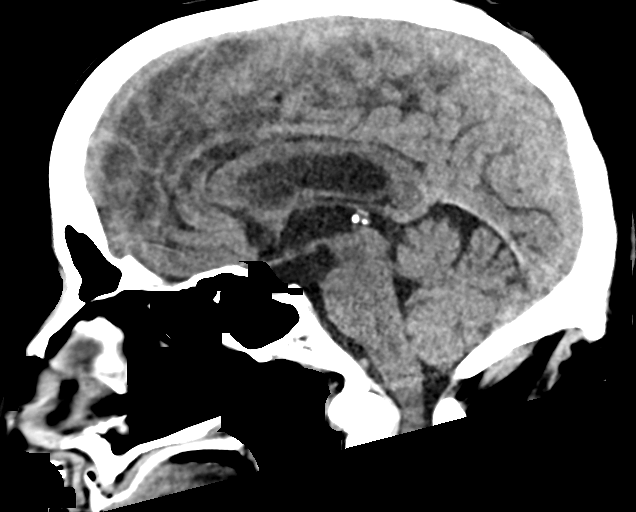
[im 40/60  brain]
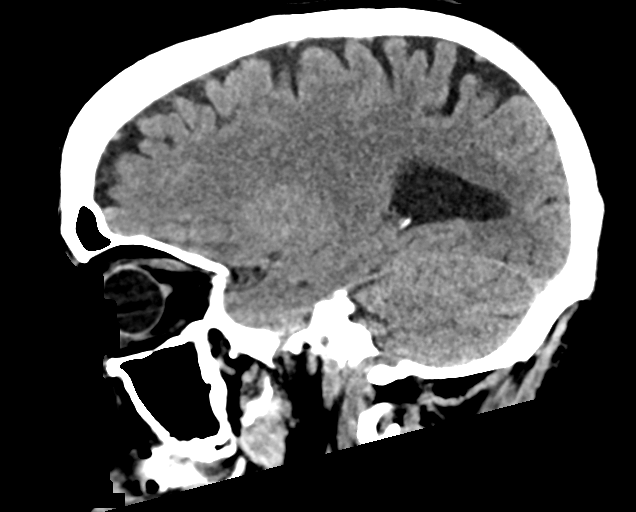

[16 of 47 positions shown; findings below may reference images not displayed]

FINDINGS: Brain: There is no evidence of an acute infarct, intracranial
hemorrhage, mass, midline shift, or extra-axial fluid collection. A
small chronic superior right cerebellar infarct is again noted. The
ventricles and sulci are within normal limits for age.

Vascular: No hyperdense vessel.

Skull: No fracture or suspicious osseous lesion.

Sinuses/Orbits: Paranasal sinuses and mastoid air cells are clear.
Bilateral cataract extraction.

Other: None.

ASPECTS (Alberta Stroke Program Early CT Score)

- Ganglionic level infarction (caudate, lentiform nuclei, internal
capsule, insula, M1-M3 cortex): 7

- Supraganglionic infarction (M4-M6 cortex): 3

Total score (0-10 with 10 being normal): 10
IMPRESSION: 1. No evidence of acute intracranial abnormality.
2. ASPECTS is 10.
3. Small chronic cerebellar infarct.

These results were communicated to Dr. MODA at [DATE] on
[DATE] by text page via the [employer] messaging system.

## 2020-10-26 IMAGING — MR MR HEAD W/O CM
12 of 13 series · 44 of 48 positions shown · non-contrast
Comparison: Head CT [DATE]

CLINICAL DATA: Acute left-sided weakness and fatigue. History of
stroke.

EXAM:
MRI HEAD WITHOUT CONTRAST
TECHNIQUE: Multiplanar, multiecho pulse sequences of the brain and surrounding
structures were obtained without intravenous contrast.

[Series 5: DWI · axial · 3.0mm · 0.88mm/px · z∈[-64,+82]mm · 8 of 100 slices shown (1 of 4)]
[im 1/100]
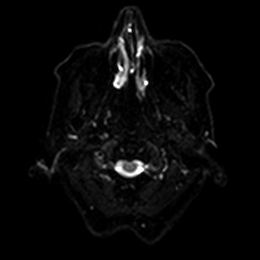
[im 15/100]
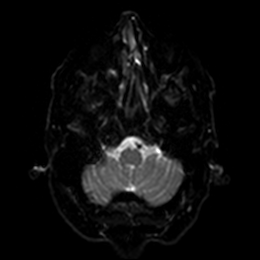
[im 29/100]
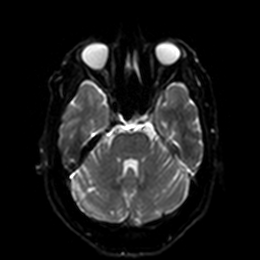
[im 43/100]
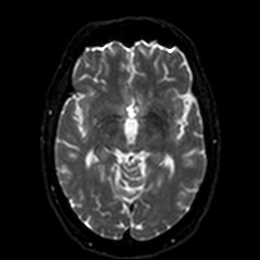
[im 57/100]
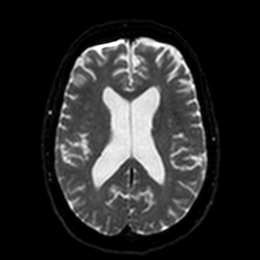
[im 71/100]
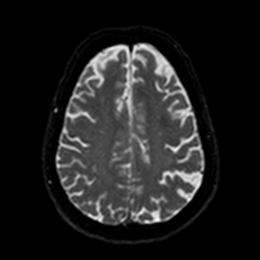
[im 85/100]
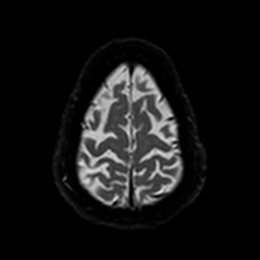
[im 100/100]
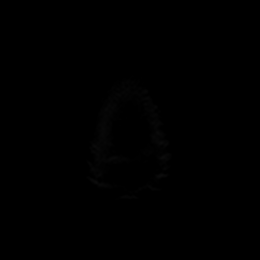

[Series 6: DWI · axial · 3.0mm · 0.88mm/px · z∈[-64,+82]mm · 4 of 50 slices shown (2 of 4)]
[im 1/50]
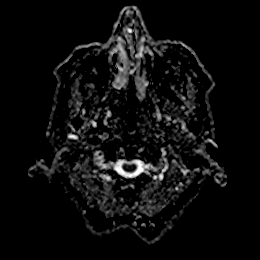
[im 17/50]
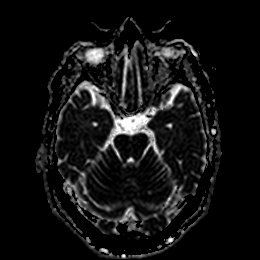
[im 33/50]
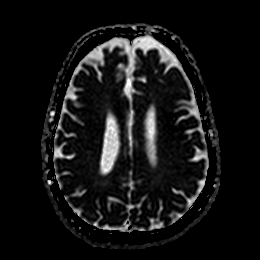
[im 50/50]
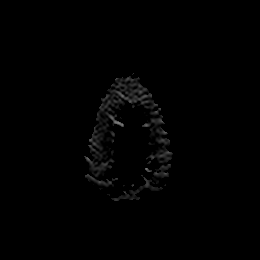

[Series 7: DWI · coronal · 4.0mm · 0.88mm/px · 6 of 72 slices shown (3 of 4)]
[im 1/72]
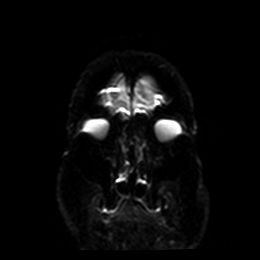
[im 15/72]
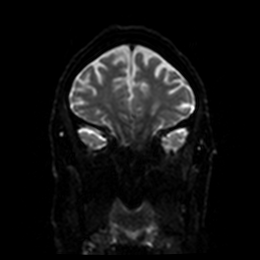
[im 29/72]
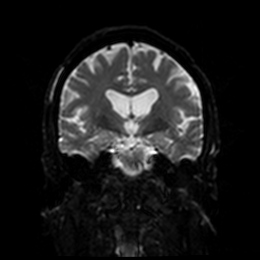
[im 43/72]
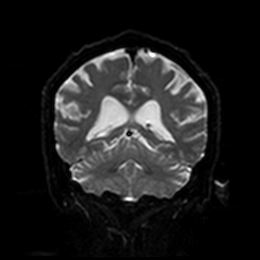
[im 57/72]
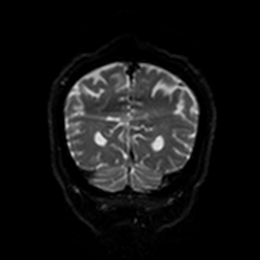
[im 72/72]
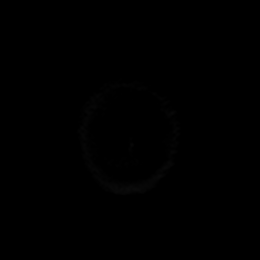

[Series 8: DWI · coronal · 4.0mm · 0.88mm/px · 3 of 36 slices shown (4 of 4)]
[im 1/36]
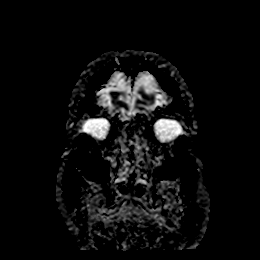
[im 18/36]
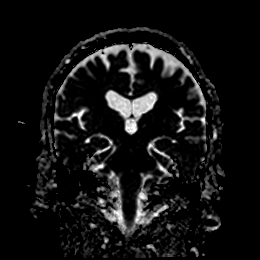
[im 36/36]
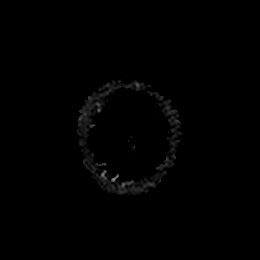

[Series 9: T1 · sagittal · 5.0mm · 0.75mm/px · 2 of 25 slices shown]
[im 1/25]
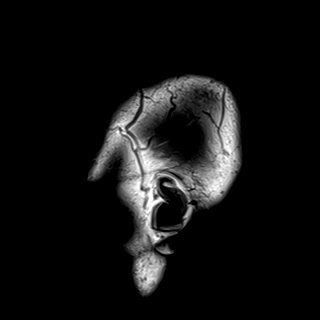
[im 25/25]
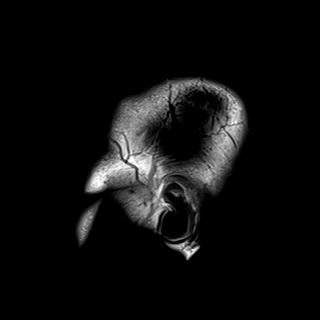

[Series 10: T2 · axial · 5.0mm · 0.72mm/px · z∈[-63,+80]mm · 2 of 25 slices shown (1 of 2)]
[im 1/25]
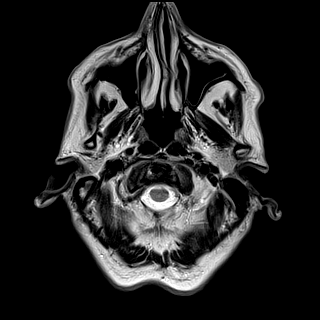
[im 25/25]
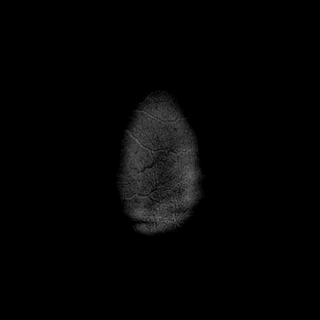

[Series 11: FLAIR · axial · 5.0mm · 0.45mm/px · z∈[-63,+80]mm · 2 of 25 slices shown]
[im 1/25]
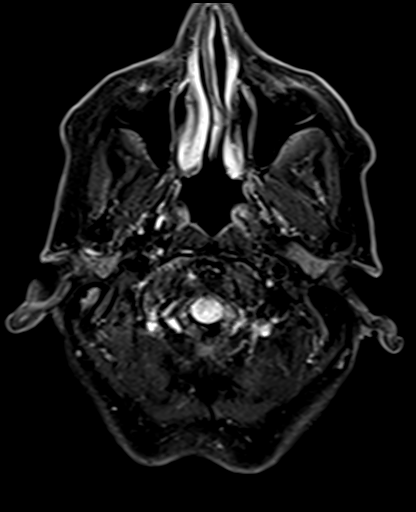
[im 25/25]
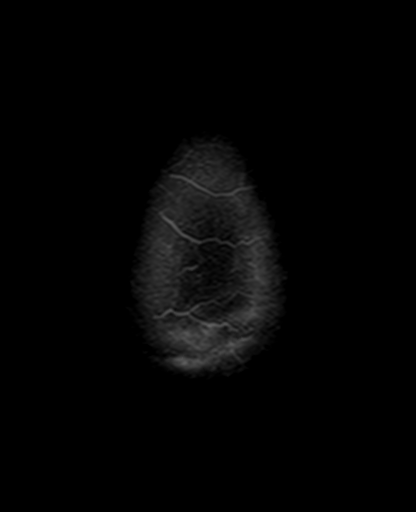

[Series 12: mag_images · axial · 3.0mm · 0.90mm/px · z∈[-67,+84]mm · 4 of 52 slices shown]
[im 1/52]
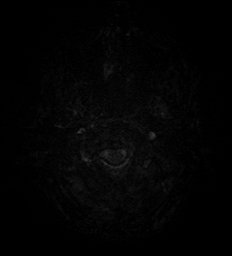
[im 18/52]
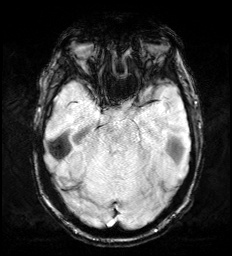
[im 35/52]
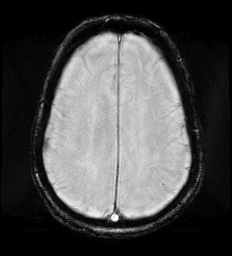
[im 52/52]
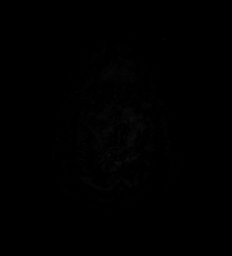

[Series 13: pha_images · axial · 3.0mm · 0.90mm/px · z∈[-67,+84]mm · 4 of 52 slices shown]
[im 1/52]
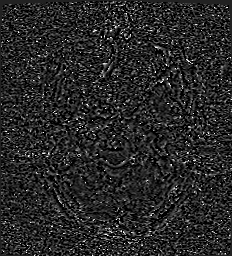
[im 18/52]
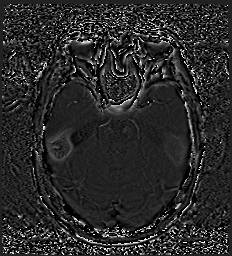
[im 35/52]
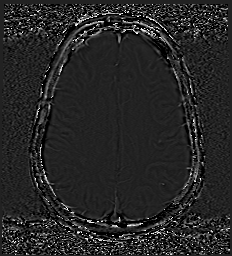
[im 52/52]
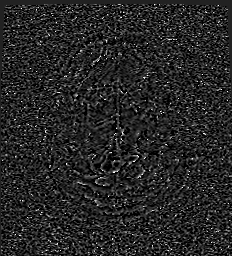

[Series 14: swi_images · axial · 3.0mm · 0.90mm/px · z∈[-67,+84]mm · 4 of 52 slices shown]
[im 1/52]
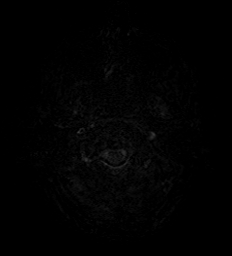
[im 18/52]
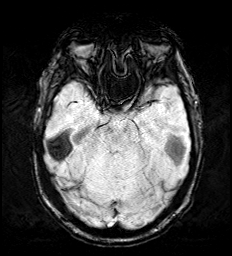
[im 35/52]
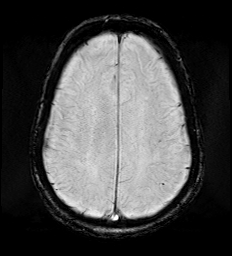
[im 52/52]
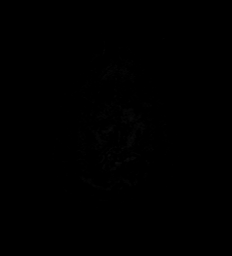

[Series 15: mip_images(sw) · axial · 24.0mm · 0.90mm/px · z∈[-57,+74]mm · 3 of 45 slices shown]
[im 1/45]
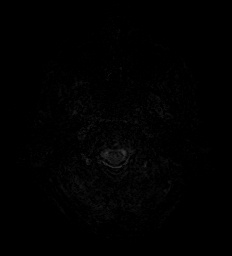
[im 23/45]
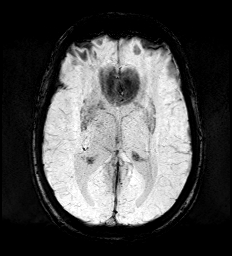
[im 45/45]
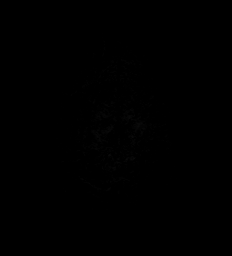

[Series 17: T2 · coronal · 5.0mm · 0.34mm/px · 2 of 32 slices shown (2 of 2)]
[im 1/32]
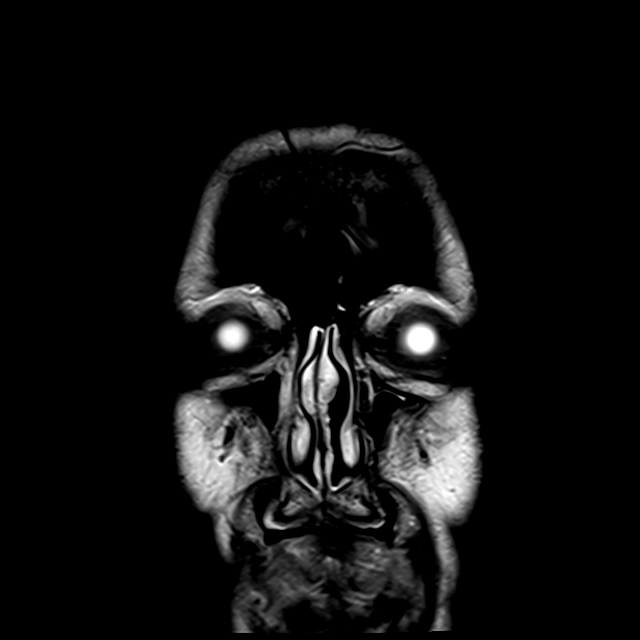
[im 32/32]
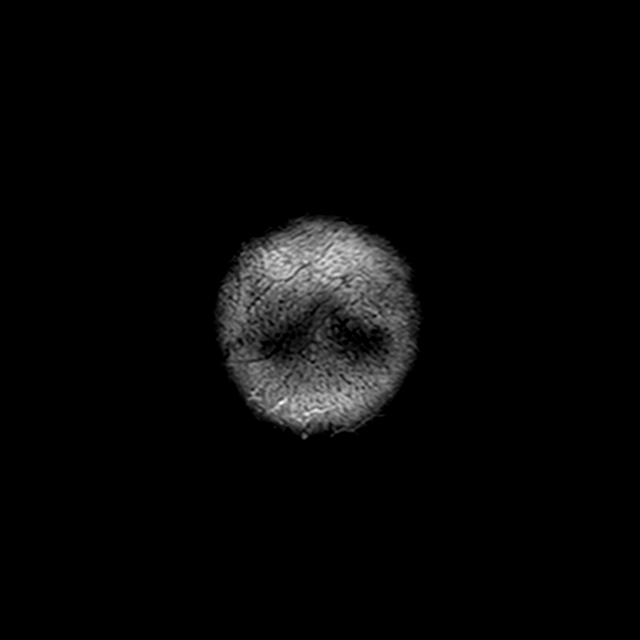

[44 of 48 positions shown; findings below may reference images not displayed]

FINDINGS: Brain: There is no evidence of an acute infarct, intracranial
hemorrhage, mass, midline shift, or extra-axial fluid collection.
Mild cerebral atrophy is within normal limits for age. There are
small chronic bilateral cerebellar infarcts. Scattered punctate foci
of T2 hyperintensity in the cerebral white matter bilaterally are
nonspecific but compatible with minimal chronic small vessel
ischemic disease which is not considered advanced for age.

Vascular: Major intracranial vascular flow voids are preserved.

Skull and upper cervical spine: Unremarkable bone marrow signal.

Sinuses/Orbits: Bilateral cataract extraction. Paranasal sinuses and
mastoid air cells are clear.

Other: None.
IMPRESSION: 1. No acute intracranial abnormality.
2. Chronic bilateral cerebellar infarcts.

## 2020-10-26 MED ORDER — STROKE: EARLY STAGES OF RECOVERY BOOK
Freq: Once | Status: AC
  Filled 2020-10-26: qty 1

## 2020-10-26 MED ORDER — SODIUM CHLORIDE 0.9% FLUSH
3.0000 mL | Freq: Once | INTRAVENOUS | Status: AC
  Administered 2020-10-26: 3 mL via INTRAVENOUS

## 2020-10-26 MED ORDER — ALBUTEROL SULFATE HFA 108 (90 BASE) MCG/ACT IN AERS: 2.0000 | INHALATION_SPRAY | Freq: Four times a day (QID) | RESPIRATORY_TRACT | Status: DC | PRN

## 2020-10-26 MED ORDER — MOMETASONE FURO-FORMOTEROL FUM 200-5 MCG/ACT IN AERO
2.0000 | INHALATION_SPRAY | Freq: Two times a day (BID) | RESPIRATORY_TRACT | Status: DC
  Administered 2020-10-27: 2 via RESPIRATORY_TRACT
  Filled 2020-10-26: qty 8.8

## 2020-10-26 MED ORDER — UMECLIDINIUM BROMIDE 62.5 MCG/INH IN AEPB
1.0000 | INHALATION_SPRAY | Freq: Every day | RESPIRATORY_TRACT | Status: DC
  Filled 2020-10-26: qty 7

## 2020-10-26 NOTE — ED Notes (Signed)
Pt back from MRI 

## 2020-10-26 NOTE — Consult Note (Addendum)
Neurology Consult H&P  CC: left weakness and ams  History is obtained from: patient and EMS  HPI: Samuel Moore is a 69 y.o. male previously normal developed confusion and left sided weakness with onset at 53. He has never had these symptoms before.  Denies headache, change in vision/hearing/N/V  LKW: 1730 tpa given?: No, not a candidate IR Thrombectomy? No, low NIHSS Modified Rankin Scale: 0-Completely asymptomatic and back to baseline post- stroke NIHSS: 0  ROS: A complete ROS was performed and is negative except as noted in the HPI.    Past Medical History:  Diagnosis Date  . Cancer ([place])   . COPD (chronic obstructive pulmonary disease) ([place])   . Hypertension     No family history on file.  Social History:  reports that he quit smoking about 17 years ago. His smoking use included cigarettes. He has a 20.00 pack-year smoking history. He has never used smokeless tobacco. He reports that he does not drink alcohol and does not use drugs.   Prior to Admission medications   Not on File    Exam: Current vital signs: BP (!) 157/86 (BP Location: Left Arm)   Pulse 80   Temp 98.2 F (36.8 C) (Oral)   Resp 16   Ht 6' (1.829 m)   Wt 95.2 kg   SpO2 98%   BMI 28.46 kg/m   Physical Exam  Constitutional: Appears well-developed and well-nourished.  Psych: Affect appropriate to situation Eyes: No scleral injection HENT: No OP obstrucion Head: Normocephalic.  Cardiovascular: Normal rate and regular rhythm.  Respiratory: Effort normal and breath sounds normal to anterior ascultation GI: Soft.  No distension. There is no tenderness.  Skin: WDI  Neuro: Mental Status: Patient is awake, alert, oriented to person, place, month, year, and situation. Patient is able to give a clear and coherent history. No signs of aphasia or neglect. Cranial Nerves: II: Visual Fields are full. Pupils are equal, round, and reactive to light. III,IV, VI: EOMI without ptosis or diploplia.   V: Facial sensation is symmetric to temperature VII: Facial movement is symmetric.  VIII: hearing is intact to voice X: Uvula elevates symmetrically XI: Shoulder shrug is symmetric. XII: tongue is midline without atrophy or fasciculations.  Motor: Tone is normal. Bulk is normal. 5/5 strength was present in all four extremities. Sensory: Sensation is symmetric to light touch and temperature in the arms and legs. Deep Tendon Reflexes: 2+ and symmetric in the biceps and patellae. Plantars: Toes are downgoing bilaterally. Cerebellar: FNF and HKS are intact bilaterally.  I have reviewed the images obtained: NCT head showed no acute ischemic changes.  Assessment: Samuel Moore is a 69 y.o. male already on aspirin reported to have acute encephalopathy and left sided weakness which resolved on arrival. The patient had vascular risk factors and will need further evaluation.  Impression:  TIA - left sided weakness. HTN History of bladder cancer.  Plan: - MRI brain without contrast. - Recommend vascular imaging with MRA head and neck. - Recommend TTE. - Recommend labs: HbA1c, lipid panel. - Recommend Statin if LDL > 70 - Aspirin 81mg  daily. - Clopidogrel 75mg  daily for 3 weeks. - Permissive hypertension first 24 h < 220/110. - Telemetry monitoring for arrhythmia. - Recommend bedside Swallow screen. - Recommend Stroke education. - Recommend PT/OT/SLP consult.  Electronically signed by: Dr. Lynnae Sandhoff Pager: [phone] 10/26/2020, 7:37 PM

## 2020-10-26 NOTE — ED Triage Notes (Signed)
PT brought by [facility] for AMS/Code stroke..  Wife states he was sitting down to eat desert at [facility] and huis right hand stopped working and the pt stated that he didn't feel right.  THe wife states that she began driving him to [place] and he became less responsive so she called EMS.

## 2020-10-26 NOTE — Progress Notes (Signed)
Family Medicine Teaching Service Daily Progress Note Intern Pager: [phone]  Patient name: Samuel Moore Medical record number: [id number] Date of birth: [birth year] Age: 69 y.o. Gender: male  Primary Care Provider: [place] [facility]: Neurology  Code Status: FULL   Pt Overview and Major Events to Date:  Samuel TIPPEN is a 69 y.o. male presenting with left-sided weakness and syncope. PMH is significant for CVA, GERD, hypertension, hyperlipidemia, lung CA, bladder CA and pancytopenia.  Assessment and Plan:   TIA Complete resolution of neurological symptoms, patient is back to his baseline. ANO to place, day, date, year, name, DOB etc. CT head: no acute intracranial abnormality, Small chronic cerebellar infarct. MRI brain: no acute intracranial abnormality. Chronic bilateral cerebellar infarcts. MRA head and neck: negative. Passed swallow overnight. Lipid panel: HDL 35. A1c 6. -Neurology following, appreciate recommendations -Vitals per floor routine -Up with assistance  -Regular diet -Follow-up TTE -ASA 81 mg daily, clopidogrel 75 mg daily for 3-weeks -Permissive hypertension BP goal <220/110 -Stroke education -PT/OT  Syncopal episode There is question of a syncopal episode on the way to the hospital last night when his wife was driving. Pt does not have recollection of these events. Unclear how long it lasted. CT head negative. Will hold off on ordering troponin now as patient denies chest pain and EKG shows SR, no ischemic changes. 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 CT head neg at this time. MRI brain: no ischemia or enhancing lesions - likely had a tPA aborted stroke. CTA that showed concern for possible septic emboli - TTE/TEE wnl. 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 Home meds: ASA 81 mg daily -Plan as above   HLD Home meds: Atorvastatin 40 mg Lipid panel on admission: HDL 35 -Continue Atorvastatin 51m   Pancytopenia Patient endorses recent history of fatigue for 3 weeks. [facility] [place] [facility] [place] [place] WBC 3.4, Hb 10, MCV 88, platelets 56. Has a history of Lung ca and bladder ca. No evidence of bleeding on examination.  -Monitor CBC daily -SCDs  HTN  SBP 157-138, 77-86 No known home antihypertensives -Continue to monitor blood pressure -Permissive hypertension BP goal <220/110 -Consider starting antihypertensive on dischaarge  COPD Sats 96% on air, RR 14 Home meds: Spiriva 154m once daily, symbicort 160-4.65m73m puffs twice daily,  albuterol 2 puff Q6H -Continue Spiriva, Symbicort, albuterol   Hx of Lung Ca Hx of stage II lung cancer s/p LLL resection , 2015.  [facility] is an ex smoker of 20 yrs, smoked 2 packs a day. Quit smoking 10 yrs ago.  Bladder Ca, s/p cystectomy, 2018 Urostomy in right lower quadrant.  Urostomy site clean and dry. [facility] Home meds: Pantoprazole 40 mg -Continue pantoprazole   Hx of AAA  FEN/GI: Regular diet  Prophylaxis: SCDs  Subjective:  No acute events overnight.  Objective: Temp:  [98.2 F (36.8 C)-98.7 F (37.1 C)] 98.7 F (37.1 C) (11/14 0515) Pulse Rate:  [65-93] 65 (11/14 0703) Resp:  [9-22] 16 (11/14 0703) BP: (131-166)/(62-97) 157/97 (11/14 0703) SpO2:  [94 %-99 %] 97 % (11/14 0703) Weight:  [95.2 kg] 95.2 kg (11/13 1912)   Physical Exam: General: well appearing, alert  Cardiovascular: s1 and s2, RRR Respiratory: CTAB, normal wob Abdomen: soft non tender Extremities: no peripheral edema  Cranial nerves grossly intact, ANO to place, day, date, year, name, DOB   Laboratory: Recent Labs  Lab 10/26/20 1843 10/26/20 1848 10/27/20 0502  WBC 3.4*  --  3.4*  HGB 10.1* 11.6* 10.0*  HCT 35.1*  34.0* 34.5*  PLT 59*  --  56*   Recent Labs  Lab 10/26/20 1843 10/26/20 1848 10/27/20 0502  NA 138 140 138  K 4.3 4.2 4.3  CL 106 104 107  CO2 24  --  25  BUN _0 CREATININE 1.37* 1.40* 1.29*  CALCIUM 8.8*  --  8.5*  PROT 6.6  --   --   BILITOT 0.5  --   --   ALKPHOS 70  --   --   ALT 13  --   --   AST 15  --   --   GLUCOSE 114* 106* 127*      Imaging/Diagnostic Tests: MR ANGIO HEAD WO CONTRAST  Result Date: 10/27/2020 CLINICAL DATA:  Stroke EXAM: MRA NECK WITHOUT AND WITH CONTRAST MRA HEAD WITHOUT CONTRAST TECHNIQUE: Multiplanar and multiecho pulse sequences of the neck were obtained without and with intravenous contrast. Angiographic images of the neck were obtained using MRA technique without and with intravenous contrast.; Angiographic images of the Circle of Willis were obtained using MRA technique without intravenous contrast. CONTRAST:  90m GADAVIST GADOBUTROL 1 MMOL/ML IV SOLN COMPARISON:  None. FINDINGS: MRA NECK FINDINGS Normal carotid and vertebral arteries. 3 vessel branching pattern of the aorta. Normal visualized subclavian arteries. MRA HEAD FINDINGS POSTERIOR CIRCULATION: --Vertebral arteries: Normal --Inferior cerebellar arteries: Normal. --Basilar artery: Normal. --Superior cerebellar arteries: Normal. --Posterior cerebral arteries: Normal. ANTERIOR CIRCULATION: --Intracranial internal carotid arteries: Normal. --Anterior cerebral arteries (ACA): Normal. --Middle cerebral arteries (MCA): Normal. ANATOMIC VARIANTS: Fetal origin of the right PCA. IMPRESSION: Normal MRA of the head and neck. Electronically Signed   By: KUlyses JarredM.D.   On: 10/27/2020 03:08   MR ANGIO NECK W WO CONTRAST  Result Date: 10/27/2020 CLINICAL DATA:  Stroke EXAM: MRA NECK WITHOUT AND WITH CONTRAST MRA HEAD WITHOUT CONTRAST TECHNIQUE: Multiplanar and multiecho pulse sequences of the neck were obtained without and with intravenous contrast. Angiographic images of the neck were  obtained using MRA technique without and with intravenous contrast.; Angiographic images of the Circle of Willis were obtained using MRA technique without intravenous contrast. CONTRAST:  145mGADAVIST GADOBUTROL 1 MMOL/ML IV SOLN COMPARISON:  None. FINDINGS: MRA NECK FINDINGS Normal carotid and vertebral arteries. 3 vessel branching pattern of the aorta. Normal visualized subclavian arteries. MRA HEAD FINDINGS POSTERIOR CIRCULATION: --Vertebral arteries: Normal --Inferior cerebellar arteries: Normal. --Basilar artery: Normal. --Superior cerebellar arteries: Normal. --Posterior cerebral arteries: Normal. ANTERIOR CIRCULATION: --Intracranial internal carotid arteries: Normal. --Anterior cerebral arteries (ACA): Normal. --Middle cerebral arteries (MCA): Normal. ANATOMIC VARIANTS: Fetal origin of the right PCA. IMPRESSION: Normal MRA of the head and neck. Electronically Signed   By: KeUlyses Jarred.D.   On: 10/27/2020 03:08   MR BRAIN WO CONTRAST  Result Date: 10/26/2020 CLINICAL DATA:  Acute left-sided weakness and fatigue. History of stroke. EXAM: MRI HEAD WITHOUT CONTRAST TECHNIQUE: Multiplanar, multiecho pulse sequences of the brain and surrounding structures were obtained without intravenous contrast. COMPARISON:  Head CT 10/26/2020 FINDINGS: Brain: There is no evidence of an acute infarct, intracranial hemorrhage, mass, midline shift, or extra-axial fluid collection. Mild cerebral atrophy is within normal limits for age. There  are small chronic bilateral cerebellar infarcts. Scattered punctate foci of T2 hyperintensity in the cerebral white matter bilaterally are nonspecific but compatible with minimal chronic small vessel ischemic disease which is not considered advanced for age. Vascular: Major intracranial vascular flow voids are preserved. Skull and upper cervical spine: Unremarkable bone marrow signal. Sinuses/Orbits: Bilateral cataract extraction. Paranasal sinuses and mastoid air cells are clear.  Other: None. IMPRESSION: 1. No acute intracranial abnormality. 2. Chronic bilateral cerebellar infarcts. Electronically Signed   By: Logan Bores M.D.   On: 10/26/2020 21:53   CT HEAD CODE STROKE WO CONTRAST  Result Date: 10/26/2020 CLINICAL DATA:  Code stroke.  Left-sided weakness. EXAM: CT HEAD WITHOUT CONTRAST TECHNIQUE: Contiguous axial images were obtained from the base of the skull through the vertex without intravenous contrast. COMPARISON:  09/26/2019 FINDINGS: Brain: There is no evidence of an acute infarct, intracranial hemorrhage, mass, midline shift, or extra-axial fluid collection. A small chronic superior right cerebellar infarct is again noted. The ventricles and sulci are within normal limits for age. Vascular: No hyperdense vessel. Skull: No fracture or suspicious osseous lesion. Sinuses/Orbits: Paranasal sinuses and mastoid air cells are clear. Bilateral cataract extraction. Other: None. ASPECTS [facility] Stroke Program Early CT Score) - Ganglionic level infarction (caudate, lentiform nuclei, internal capsule, insula, M1-M3 cortex): 7 - Supraganglionic infarction (M4-M6 cortex): 3 Total score (0-10 with 10 being normal): 10 IMPRESSION: 1. No evidence of acute intracranial abnormality. 2. ASPECTS is 10. 3. Small chronic cerebellar infarct. These results were communicated to Dr. Theda Sers at 7:02 pm on 10/26/2020 by text page via the [facility] messaging system. Electronically Signed   By: Logan Bores M.D.   On: 10/26/2020 19:02    Lattie Haw, MD 10/27/2020, 7:27 AM PGY-2, [place] Intern pager: [phone] text pages welcome

## 2020-10-26 NOTE — ED Provider Notes (Signed)
[facility] Emergency Department Provider Note MRN:  [id number]  Arrival date & time: 10/26/20     Chief Complaint   Code Stroke   History of Present Illness   Samuel Moore is a 69 y.o. year-old male with a history of COPD, hypertension presenting to the ED with chief complaint of code stroke.  Increased fatigue and somnolence for the past 3 weeks.  This evening at dinner, patient was exhibiting left arm weakness, kept dropping his fork, clearly weaker than the left side of his body.  History of stroke in the past.  Then he nearly passed out at [employer].  Wife tried to drive him to the [place] but he passed out and was unresponsive in the car and so called EMS.  Patient currently without symptoms other than fatigue, weakness resolved.  Review of Systems  A complete 10 system review of systems was obtained and all systems are negative except as noted in the HPI and PMH.   Patient's Health History    Past Medical History:  Diagnosis Date  . Cancer ([place])   . COPD (chronic obstructive pulmonary disease) ([place])   . Hypertension     Past Surgical History:  Procedure Laterality Date  . BLADDER REMOVAL    . LOBECTOMY    . prostate     removed    No family history on file.  Social History   Socioeconomic History  . Marital status: Married    Spouse name: Not on file  . Number of children: Not on file  . Years of education: Not on file  . Highest education level: Not on file  Occupational History  . Not on file  Tobacco Use  . Smoking status: Former Smoker    Packs/day: 2.00    Years: 10.00    Pack years: 20.00    Types: Cigarettes    Quit date: 12/14/2002    Years since quitting: 17.8  . Smokeless tobacco: Never Used  Substance and Sexual Activity  . Alcohol use: No  . Drug use: No  . Sexual activity: Not on file  Other Topics Concern  . Not on file  Social History Narrative  . Not on file   Social Determinants of Health   Financial  Resource Strain:   . Difficulty of Paying Living Expenses: Not on file  Food Insecurity:   . Worried About Charity fundraiser in the Last Year: Not on file  . Ran Out of Food in the Last Year: Not on file  Transportation Needs:   . Lack of Transportation (Medical): Not on file  . Lack of Transportation (Non-Medical): Not on file  Physical Activity:   . Days of Exercise per Week: Not on file  . Minutes of Exercise per Session: Not on file  Stress:   . Feeling of Stress : Not on file  Social Connections:   . Frequency of Communication with Friends and Family: Not on file  . Frequency of Social Gatherings with Friends and Family: Not on file  . Attends Religious Services: Not on file  . Active Member of Clubs or Organizations: Not on file  . Attends Archivist Meetings: Not on file  . Marital Status: Not on file  Intimate Partner Violence:   . Fear of Current or Ex-Partner: Not on file  . Emotionally Abused: Not on file  . Physically Abused: Not on file  . Sexually Abused: Not on file     Physical Exam  Vitals:   10/26/20 1930 10/26/20 1945  BP: (!) 144/77 138/86  Pulse: 73 73  Resp: 17 16  Temp:    SpO2: 97% 97%    CONSTITUTIONAL: Well-appearing, NAD NEURO:  Alert and oriented x 3, normal and symmetric strength and sensation, normal coordination, normal speech EYES:  eyes equal and reactive ENT/NECK:  no LAD, no JVD CARDIO: Regular rate, well-perfused, normal S1 and S2 PULM:  CTAB no wheezing or rhonchi GI/GU:  normal bowel sounds, non-distended, non-tender; colostomy bag right lower quadrant MSK/SPINE:  No gross deformities, no edema SKIN:  no rash, atraumatic PSYCH:  Appropriate speech and behavior  *Additional and/or pertinent findings included in MDM below  Diagnostic and Interventional Summary    EKG Interpretation  Date/Time:  Saturday October 26 2020 19:03:28 EST Ventricular Rate:  81 PR Interval:    QRS Duration: 94 QT Interval:  386 QTC  Calculation: 448 R Axis:   47 Text Interpretation: Sinus rhythm Confirmed by Gerlene Fee [phone]) on 10/26/2020 8:15:34 PM      Labs Reviewed  CBC - Abnormal; Notable for the following components:      Result Value   WBC 3.4 (*)    RBC 3.93 (*)    Hemoglobin 10.1 (*)    HCT 35.1 (*)    MCH 25.7 (*)    MCHC 28.8 (*)    RDW 18.5 (*)    Platelets 59 (*)    All other components within normal limits  COMPREHENSIVE METABOLIC PANEL - Abnormal; Notable for the following components:   Glucose, Bld 114 (*)    Creatinine, Ser 1.37 (*)    Calcium 8.8 (*)    GFR, Estimated 55 (*)    All other components within normal limits  I-STAT CHEM 8, ED - Abnormal; Notable for the following components:   Creatinine, Ser 1.40 (*)    Glucose, Bld 106 (*)    Hemoglobin 11.6 (*)    HCT 34.0 (*)    All other components within normal limits  CBG MONITORING, ED - Abnormal; Notable for the following components:   Glucose-Capillary 102 (*)    All other components within normal limits  PROTIME-INR  APTT  DIFFERENTIAL    CT HEAD CODE STROKE WO CONTRAST  Final Result    MR BRAIN WO CONTRAST    (Results Pending)    Medications  sodium chloride flush (NS) 0.9 % injection 3 mL (3 mLs Intravenous Given 10/26/20 1924)     Procedures  /  Critical Care Procedures  ED Course and Medical Decision Making  I have reviewed the triage vital signs, the nursing notes, and pertinent available records from the EMR.  Listed above are laboratory and imaging tests that I personally ordered, reviewed, and interpreted and then considered in my medical decision making (see below for details).  Concern for possible TIA, code stroke prior to arrival.  Seems to be back to baseline at this time with no focal deficits.  Not a TPA candidate given the NIH stroke scale of 0.  CT head reassuring, per neuro recommendations will admit to hospital service and obtain MRI.       Barth Kirks. [place] Small, [place] [email or web address]  Final Clinical Impressions(s) / ED Diagnoses     ICD-10-CM   1. TIA (transient ischemic attack)  G45.9     ED Discharge Orders    None       Discharge Instructions Discussed with and Provided to Patient:  Discharge Instructions   None       Maudie Flakes, MD 10/26/20 2016

## 2020-10-26 NOTE — ED Notes (Signed)
Patient transported to MRI 

## 2020-10-26 NOTE — H&P (Addendum)
[facility] Admission History and Physical Service Pager: [phone]  Patient name: Samuel Moore Medical record number: [id number] Date of birth: 1951/06/11 Age: 69 y.o. Gender: male  Primary Care Provider: [facility] [place] Consultants: Neurology  Code Status: FULL  Preferred Emergency Contact: : [phone]   Chief Complaint: left sided weakness   Assessment and Plan: Samuel Moore is a 69 y.o. male presenting with left-sided weakness and syncope. PMH is significant for CVA, GERD, hypertension, hyperlipidemia, lung CA, bladder CA and pancytopenia.  TIA Samuel Moore is a 69 yr old male with past medical history of CVA presents with sudden onset left-sided weakness and fatigue.  Examination unremarkable in the ED with his symptoms resolving prior to admission.  Labs WBC 3.4, Hb 10.1, platelets 59, PT 13.7, INR 1.1, glucose 114, creatinine 1.37, calcium 8.8. CT head: no acute intracranial abnormality, Small chronic cerebellar infarct. MRI brain: no acute intracranial abnormality. Chronic bilateral cerebellar infarcts. Neurology evaluated patient in ED, his symptoms resolved. NIHSS score 0. Patient has likely had a TIA given resolution of symptoms. Patient had an MCA CVA in June 2020 where he was given tPA. Imaging was negative at this time and was he was diagnosed with a cryptogenic stroke. Will admit for further work up.  -Admit to [facility], medical telemetry, attending Dr. Gwendlyn Deutscher -Neurology following, appreciate recommendations -Vitals per floor routine -Up with assistance  -NPO -Bedside swallow -Follow up MRA head and neck -Follow-up TTE -Follow-up lipid panel -Follow-up A1c  -ASA 81 mg daily, clopidogrel 75 mg daily for 3-weeks -Permissive hypertension BP goal <220/110 -Stroke education -PT/OT  Syncopal episode There is question of a syncopal episode on the way to the hospital last night when his wife was driving. Pt does not have  recollection of these events. Unclear how long it lasted. CT head negative. Will hold off on ordering troponin now as patient denies chest pain and EKG shows SR, no ischemic changes. 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 CT head neg at this time. MRI brain: no ischemia or enhancing lesions - likely had a tPA aborted stroke. CTA that showed concern for possible septic emboli - TTE/TEE wnl. Etiology of his stroke was cryptogenic. Home meds: ASA 81 mg daily -Plan as above   HLD Home meds: Atorvastatin 40 mg -Follow up lipid panel  -Hold statin as n.p.o.  Pancytopenia Patient endorses recent history of fatigue for 3 weeks. He had a bone marrow biopsy at the [place] last week. WBC 3.4, Hb 10, MCV 89, platelets 59. Has a history of Lung ca and bladder ca. No evidence of bleeding on examination.  -Monitor CBC daily  HTN  SBP 157-138, 77-86 Home meds: No known antihypertensive -Continue to monitor blood pressure -Permissive hypertension BP goal <220/110  COPD Sats 96% on air, RR 14 Home meds: Spiriva 7mg once daily, symbicort 160-4.587m2 puffs twice daily,  albuterol 2 puff Q6H -Continue Spiriva, Symbicort, albuterol after med rec  Hx of Lung Ca Hx of stage II lung cancer s/p LLL resection , 2015.  [facility] 20 [facility], smoked 2 packs a day. Quit smoking 10 yrs ago.  Bladder Ca, s/p cystectomy, 2018 Urostomy in right lower quadrant.  Urostomy site clean and dry. [facility]  meds: Pantoprazole 40 mg -Hold as n.p.o.  Hx of AAA  FEN/GI: NPO  Prophylaxis: SCDs  Disposition: Medical telemetry  History of Present Illness:  Samuel Moore is a 69 y.o. male presenting with left-sided weakness and fatigue  Patient reports a few week history of fatigue and generalized weakness. He is being seen by  hem/onc at the [facility] and last week had a bone marrow biopsy. He went to dinner with his wife this evening at 5pm and he had sudden onset left sided limb weakness and was unable to hold his fork up. He tried to stand up to leave restaurant and started "stumbling around".  He wife was worried about him so she took him in the car to the [place]. He passed out in the car so she called 911. He is unable to remember these events fully.   He now more wea. He endorses some right sided weakness and mild frontal headache than normal and not back to his baselinee. Denies chest pain, dizziness, palpitations, cough, shortness of breath, abdominal pain, fevers, constipation, diarrhea, rectal bleeding, melena etc. Patient denies sick/covid contacts. Has had both Covid vaccines at the [place].   Lives with his wife at home. Does not have home health services.   Review Of Systems: Per HPI with the following additions:   Review of Systems  Constitutional: Negative for fever.  Respiratory: Negative for cough, shortness of breath and wheezing.   Cardiovascular: Negative for chest pain, palpitations and leg swelling.  Gastrointestinal: Negative for abdominal pain, anal bleeding, blood in stool, constipation, diarrhea, nausea and vomiting.  Neurological: Positive for syncope and headaches. Negative for light-headedness and numbness.     Patient Active Problem List   Diagnosis Date Noted  . TIA (transient ischemic attack) 10/26/2020    Past Medical History: Past Medical History:  Diagnosis Date  . Cancer ([place])   . COPD (chronic obstructive pulmonary disease) ([place])   . Hypertension     Past Surgical History: Past Surgical History:  Procedure Laterality Date  . BLADDER REMOVAL    . LOBECTOMY    . prostate     removed    Social History: Social History   Tobacco Use  . Smoking status: Former Smoker    Packs/day: 2.00    Years: 10.00    Pack years: 20.00    Types: Cigarettes    Quit date: 12/14/2002    Years  since quitting: 17.8  . Smokeless tobacco: Never Used  Substance Use Topics  . Alcohol use: No  . Drug use: No   Additional social history:  Please also refer to relevant sections of EMR.  Family History: History reviewed. No pertinent family history. (If not completed, MUST add something in)  Allergies and Medications: Allergies  Allergen Reactions  . Lisinopril-Hydrochlorothiazide Other (See Comments)    Renal failure syndrome   No current facility-administered medications on file prior to encounter.   Current Outpatient Medications on File Prior to Encounter  Medication Sig Dispense Refill  . albuterol (VENTOLIN HFA) 108 (90 Base) MCG/ACT inhaler Inhale 2 puffs into the lungs 4 (four) times daily as needed for wheezing or shortness of breath.     Marland Kitchen aspirin 81 MG chewable tablet Chew 81 mg by mouth in the morning.    Marland Kitchen atorvastatin (LIPITOR) 80 MG tablet Take 40 mg by mouth daily.    . Carboxymethylcellulose Sod PF 0.5 % SOLN Place 1 drop into both eyes 4 (four) times daily.    . cyanocobalamin  1000 MCG tablet Take 1,000 mcg by mouth daily.    . pantoprazole (PROTONIX) 40 MG tablet Take 40 mg by mouth daily before breakfast.    . Tiotropium Bromide Monohydrate (SPIRIVA RESPIMAT) 2.5 MCG/ACT AERS Inhale 1 puff into the lungs in the morning and at bedtime.    . traZODone (DESYREL) 50 MG tablet Take 50 mg by mouth at bedtime as needed for sleep.    Grant Ruts INHUB 250-50 MCG/DOSE AEPB Inhale 1 puff into the lungs 2 (two) times daily.      Objective: BP (!) 166/82   Pulse 72   Temp 98.4 F (36.9 C) (Oral)   Resp 14   Ht 6' (1.829 m)   Wt 95.2 kg   SpO2 97%   BMI 28.46 kg/m   Exam: General: somnolent at times, 69 year old Caucasian male, appears stated age Eyes: No scleral icterus ENTM: No pharyngeal edema Neck: Supple, no thyromegaly, lymphadenopathy Cardiovascular: S1 and S2 present, RRR, no rubs murmurs or gallops Respiratory: CTAB, normal work of  breathing Gastrointestinal: Abdomen nondistended, soft nontender, urostomy site dry and clean MSK: No obvious deformities Derm: No rashes, skin warm and dry Neuro: Normal cranial nerve exam, no aphasia, 5/5 strength upper and lower extremities, normal sensation throughout,  Biceps reflex 2+. alert and orientated to name, DOB, day, date,year and place Psych: Normal mood, normal affect  Labs and Imaging: CBC BMET  Recent Labs  Lab 10/26/20 1843 10/26/20 1843 10/26/20 1848  WBC 3.4*  --   --   HGB 10.1*   < > 11.6*  HCT 35.1*   < > 34.0*  PLT 59*  --   --    < > = values in this interval not displayed.   Recent Labs  Lab 10/26/20 1843 10/26/20 1843 10/26/20 1848  NA 138   < > 140  K 4.3   < > 4.2  CL 106   < > 104  CO2 24  --   --   BUN 18   < > 20  CREATININE 1.37*   < > 1.40*  GLUCOSE 114*   < > 106*  CALCIUM 8.8*  --   --    < > = values in this interval not displayed.     EKG: My own interpretation (not copied from electronic read) NSR    Lattie Haw, MD 10/26/2020, 11:48 PM PGY-2 [place] [facility] pager: [phone], text pages welcome

## 2020-10-27 ENCOUNTER — Observation Stay (HOSPITAL_COMMUNITY): Payer: No Typology Code available for payment source

## 2020-10-27 ENCOUNTER — Encounter (HOSPITAL_COMMUNITY): Payer: Self-pay | Admitting: Family Medicine

## 2020-10-27 ENCOUNTER — Inpatient Hospital Stay (HOSPITAL_BASED_OUTPATIENT_CLINIC_OR_DEPARTMENT_OTHER): Payer: No Typology Code available for payment source

## 2020-10-27 DIAGNOSIS — G459 Transient cerebral ischemic attack, unspecified: Secondary | ICD-10-CM

## 2020-10-27 DIAGNOSIS — R569 Unspecified convulsions: Secondary | ICD-10-CM | POA: Diagnosis not present

## 2020-10-27 DIAGNOSIS — D61818 Other pancytopenia: Secondary | ICD-10-CM | POA: Diagnosis not present

## 2020-10-27 LAB — CBC WITH DIFFERENTIAL/PLATELET
Abs Immature Granulocytes: 0.04 10*3/uL (ref 0.00–0.07)
Basophils Absolute: 0 10*3/uL (ref 0.0–0.1)
Basophils Relative: 0 %
Eosinophils Absolute: 0.1 10*3/uL (ref 0.0–0.5)
Eosinophils Relative: 2 %
HCT: 34.5 % — ABNORMAL LOW (ref 39.0–52.0)
Hemoglobin: 10 g/dL — ABNORMAL LOW (ref 13.0–17.0)
Immature Granulocytes: 1 %
Lymphocytes Relative: 20 %
Lymphs Abs: 0.7 10*3/uL (ref 0.7–4.0)
MCH: 25.6 pg — ABNORMAL LOW (ref 26.0–34.0)
MCHC: 29 g/dL — ABNORMAL LOW (ref 30.0–36.0)
MCV: 88.5 fL (ref 80.0–100.0)
Monocytes Absolute: 0.2 10*3/uL (ref 0.1–1.0)
Monocytes Relative: 5 %
Neutro Abs: 2.4 10*3/uL (ref 1.7–7.7)
Neutrophils Relative %: 72 %
Platelets: 56 10*3/uL — ABNORMAL LOW (ref 150–400)
RBC: 3.9 MIL/uL — ABNORMAL LOW (ref 4.22–5.81)
RDW: 18.2 % — ABNORMAL HIGH (ref 11.5–15.5)
WBC: 3.4 10*3/uL — ABNORMAL LOW (ref 4.0–10.5)
nRBC: 0 % (ref 0.0–0.2)

## 2020-10-27 LAB — HEMOGLOBIN A1C
Hgb A1c MFr Bld: 6 % — ABNORMAL HIGH (ref 4.8–5.6)
Mean Plasma Glucose: 125.5 mg/dL

## 2020-10-27 LAB — URINALYSIS, ROUTINE W REFLEX MICROSCOPIC
Bilirubin Urine: NEGATIVE
Glucose, UA: NEGATIVE mg/dL
Ketones, ur: NEGATIVE mg/dL
Leukocytes,Ua: NEGATIVE
Nitrite: POSITIVE — AB
Protein, ur: NEGATIVE mg/dL
Specific Gravity, Urine: 1.01 (ref 1.005–1.030)
pH: 7 (ref 5.0–8.0)

## 2020-10-27 LAB — BASIC METABOLIC PANEL
Anion gap: 6 (ref 5–15)
BUN: 17 mg/dL (ref 8–23)
CO2: 25 mmol/L (ref 22–32)
Calcium: 8.5 mg/dL — ABNORMAL LOW (ref 8.9–10.3)
Chloride: 107 mmol/L (ref 98–111)
Creatinine, Ser: 1.29 mg/dL — ABNORMAL HIGH (ref 0.61–1.24)
GFR, Estimated: 60 mL/min — ABNORMAL LOW (ref >60)
Glucose, Bld: 127 mg/dL — ABNORMAL HIGH (ref 70–99)
Potassium: 4.3 mmol/L (ref 3.5–5.1)
Sodium: 138 mmol/L (ref 135–145)

## 2020-10-27 LAB — LIPID PANEL
Cholesterol: 122 mg/dL (ref 0–200)
HDL: 35 mg/dL — ABNORMAL LOW (ref >40)
LDL Cholesterol: 65 mg/dL (ref 0–99)
Total CHOL/HDL Ratio: 3.5 RATIO
Triglycerides: 110 mg/dL (ref <150)
VLDL: 22 mg/dL (ref 0–40)

## 2020-10-27 LAB — RESPIRATORY PANEL BY RT PCR (FLU A&B, COVID)
Influenza A by PCR: NEGATIVE
Influenza B by PCR: NEGATIVE
SARS Coronavirus 2 by RT PCR: NEGATIVE

## 2020-10-27 LAB — ECHOCARDIOGRAM COMPLETE
Area-P 1/2: 2.45 cm2
Height: 72 in
S' Lateral: 3.78 cm
Weight: 3358.05 oz

## 2020-10-27 LAB — RAPID URINE DRUG SCREEN, HOSP PERFORMED
Amphetamines: NOT DETECTED
Barbiturates: NOT DETECTED
Benzodiazepines: NOT DETECTED
Cocaine: NOT DETECTED
Opiates: NOT DETECTED
Tetrahydrocannabinol: NOT DETECTED

## 2020-10-27 LAB — HIV ANTIBODY (ROUTINE TESTING W REFLEX): HIV Screen 4th Generation wRfx: NONREACTIVE

## 2020-10-27 IMAGING — MR MR MRA HEAD W/O CM
1 series · 19 of 48 positions shown · IV contrast (gadavist)
Comparison: None.

CLINICAL DATA: Stroke

EXAM:
MRA NECK WITHOUT AND WITH CONTRAST
MRA HEAD WITHOUT CONTRAST
TECHNIQUE: Multiplanar and multiecho pulse sequences of the neck were obtained
without and with intravenous contrast. Angiographic images of the
neck were obtained using MRA technique without and with intravenous
contrast.; Angiographic images of the Circle of [place] were obtained
using MRA technique without intravenous contrast.
CONTRAST:  10mL GADAVIST GADOBUTROL 1 MMOL/ML IV SOLN

[Series 100: 3d cow · axial · 0.5mm · 0.41mm/px · z∈[-87,+3]mm · 19 of 192 slices shown]
[im 1/192]
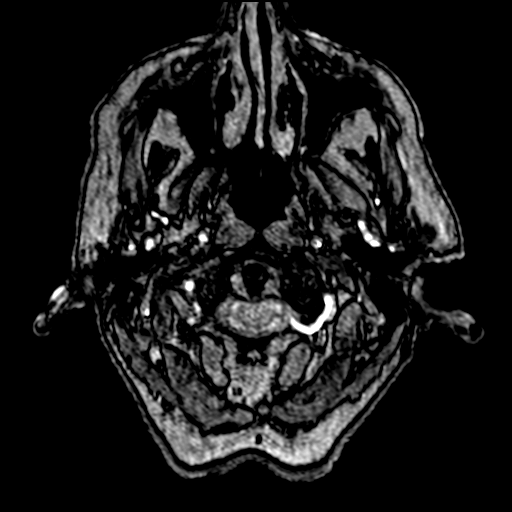
[im 5/192]
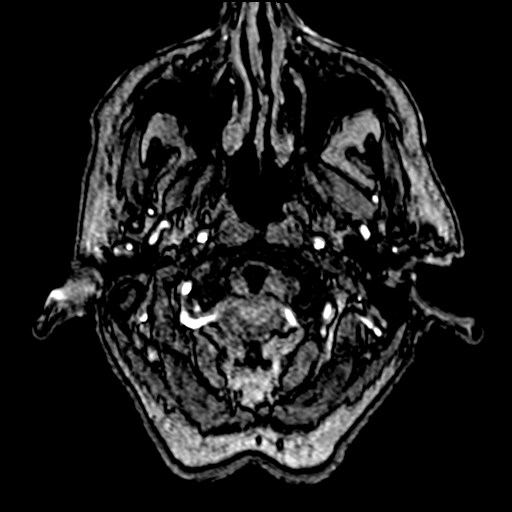
[im 9/192]
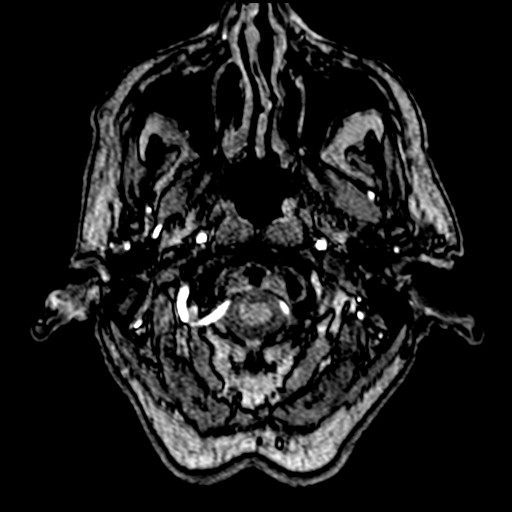
[im 13/192]
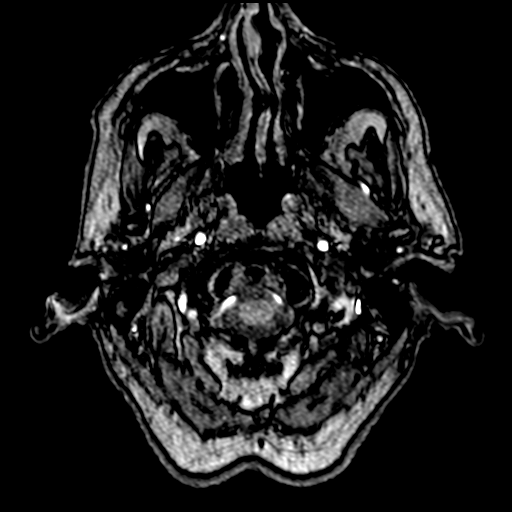
[im 17/192]
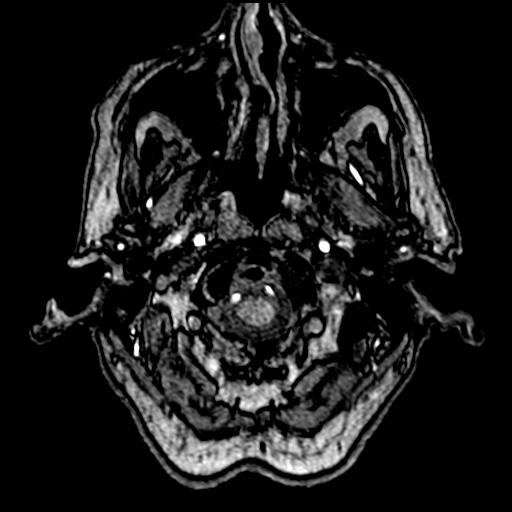
[im 21/192]
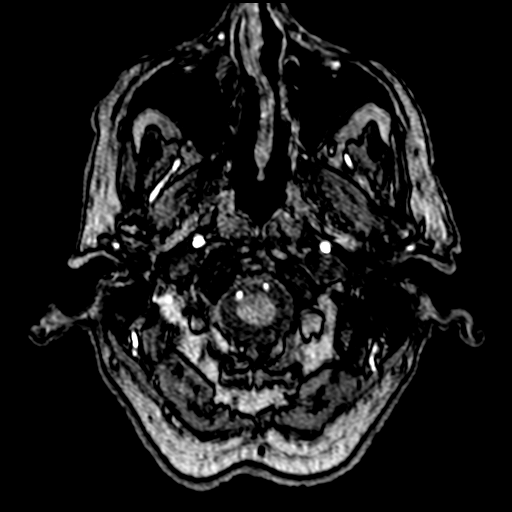
[im 25/192]
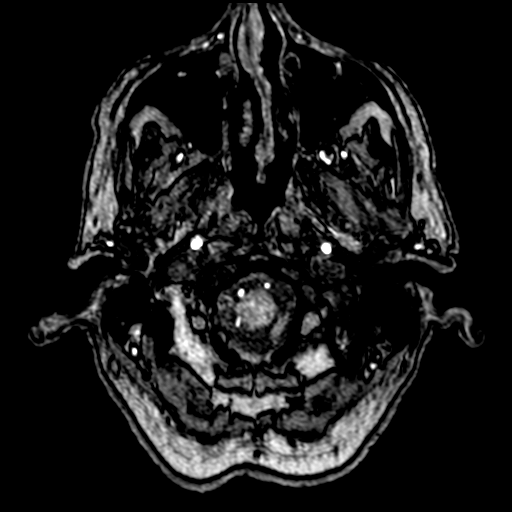
[im 29/192]
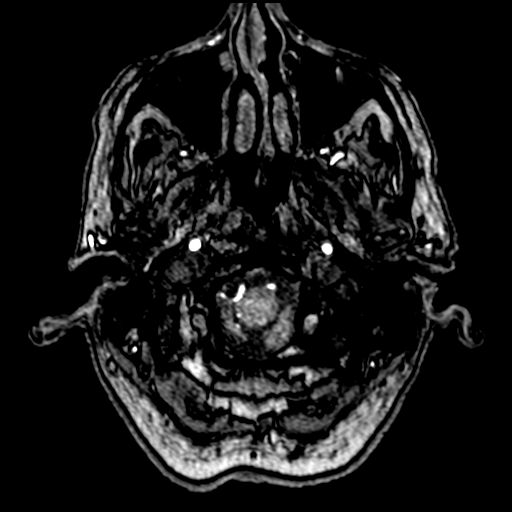
[im 33/192]
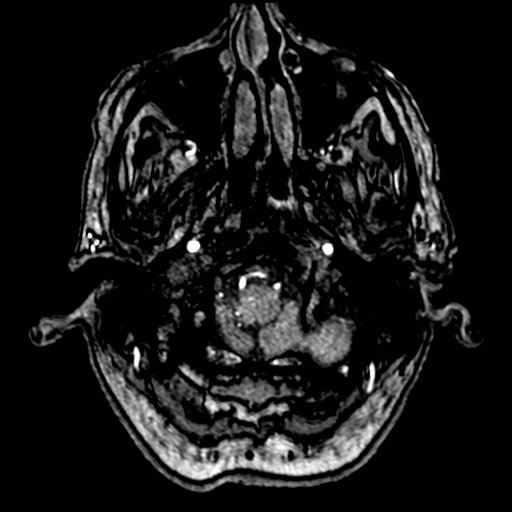
[im 37/192]
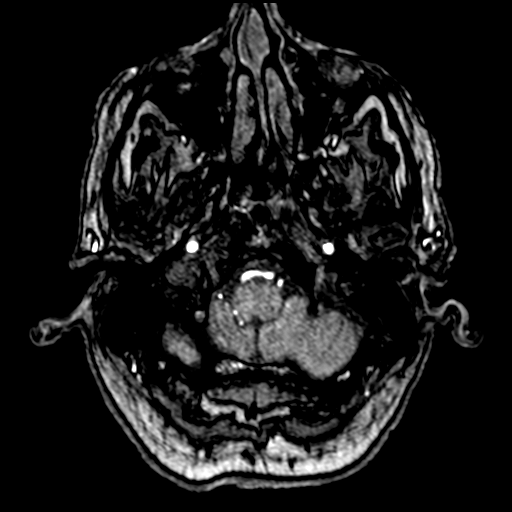
[im 41/192]
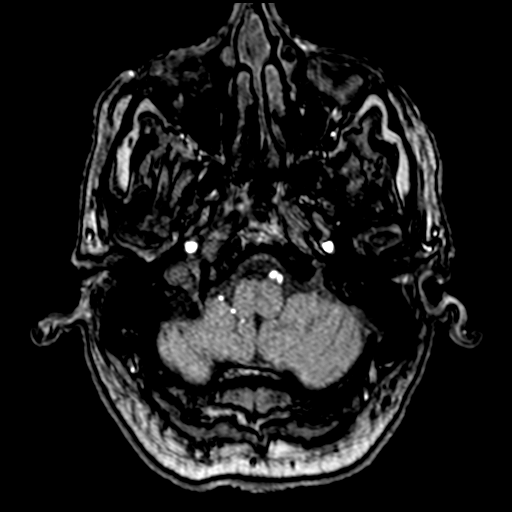
[im 61/192]
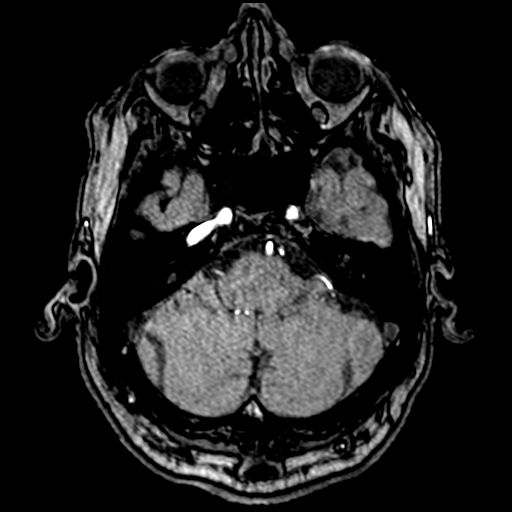
[im 86/192]
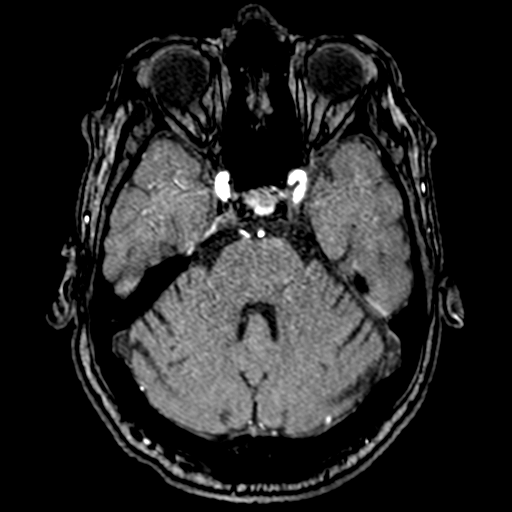
[im 98/192]
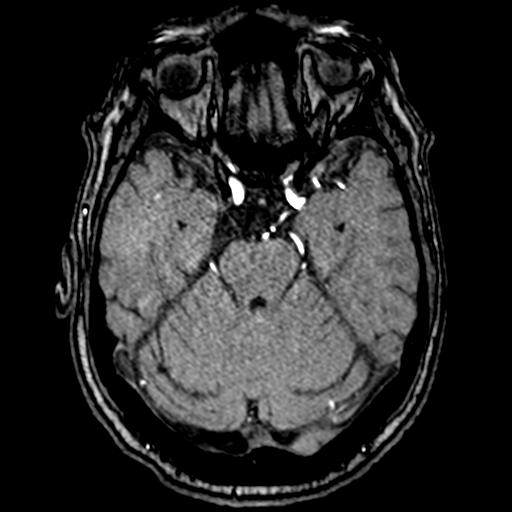
[im 110/192]
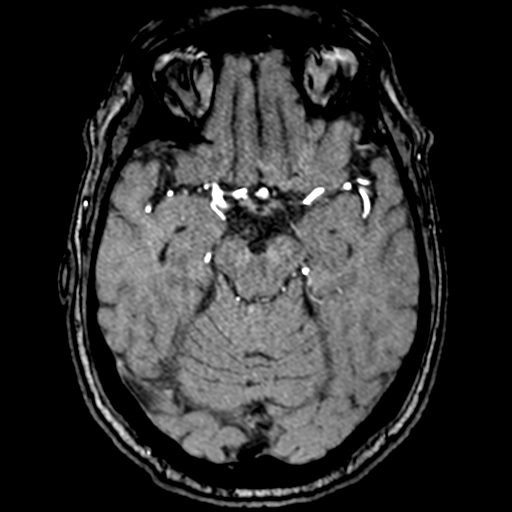
[im 135/192]
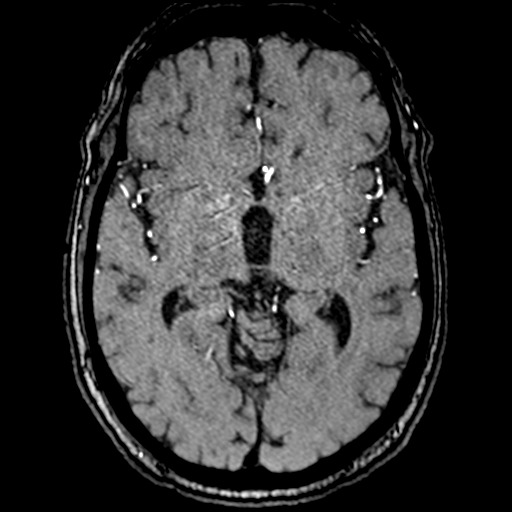
[im 159/192]
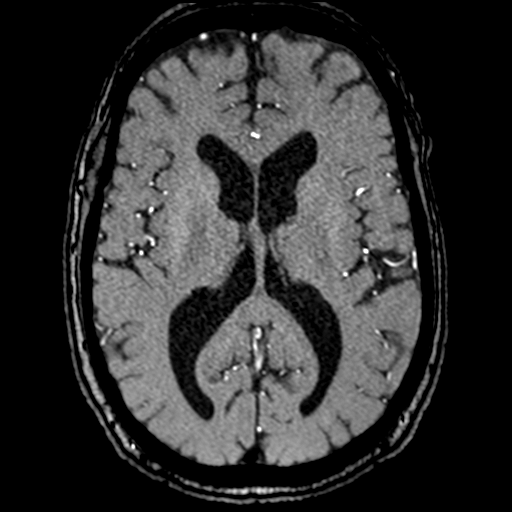
[im 163/192]
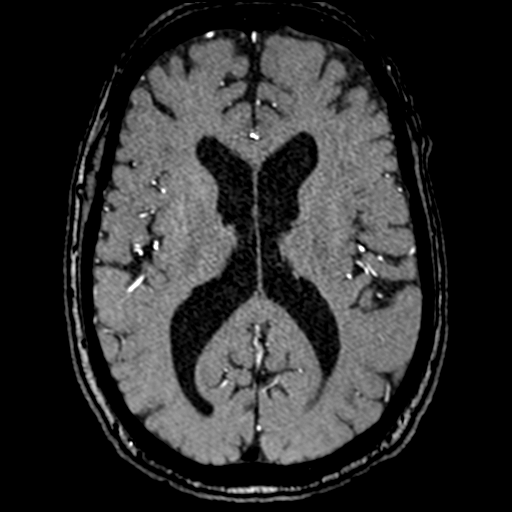
[im 183/192]
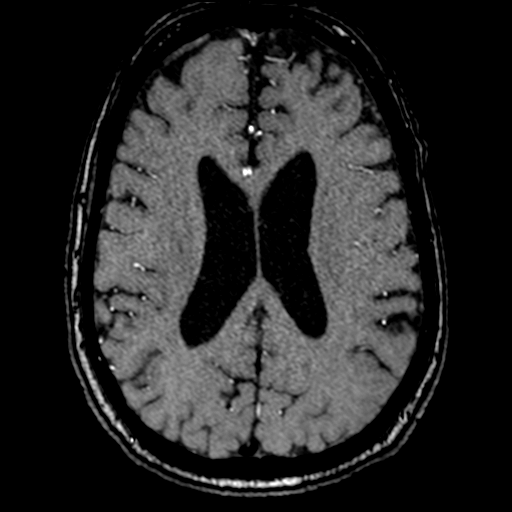

[19 of 48 positions shown; findings below may reference images not displayed]

FINDINGS: MRA NECK FINDINGS

Normal carotid and vertebral arteries. 3 vessel branching pattern of
the aorta. Normal visualized subclavian arteries.

MRA HEAD FINDINGS

POSTERIOR CIRCULATION:

--Vertebral arteries: Normal

--Inferior cerebellar arteries: Normal.

--Basilar artery: Normal.

--Superior cerebellar arteries: Normal.

--Posterior cerebral arteries: Normal.

ANTERIOR CIRCULATION:

--Intracranial internal carotid arteries: Normal.

--Anterior cerebral arteries (ACA): Normal.

--Middle cerebral arteries (MCA): Normal.

ANATOMIC VARIANTS: Fetal origin of the right PCA.
IMPRESSION: Normal MRA of the head and neck.

## 2020-10-27 IMAGING — MR MR MRA NECK WO/W CM
5 series · 44 of 48 positions shown · IV contrast (gadavist)
Comparison: None.

CLINICAL DATA: Stroke

EXAM:
MRA NECK WITHOUT AND WITH CONTRAST
MRA HEAD WITHOUT CONTRAST
TECHNIQUE: Multiplanar and multiecho pulse sequences of the neck were obtained
without and with intravenous contrast. Angiographic images of the
neck were obtained using MRA technique without and with intravenous
contrast.; Angiographic images of the Circle of Willis were obtained
using MRA technique without intravenous contrast.
CONTRAST:  10mL GADAVIST GADOBUTROL 1 MMOL/ML IV SOLN

[Series 15: tof_fl3d_tra_iso · axial · 0.6mm · 0.52mm/px · z∈[-201,-129]mm · 9 of 121 slices shown]
[im 1/121]
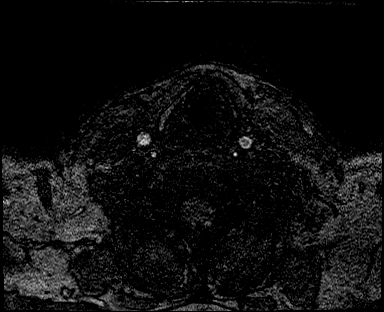
[im 22/121]
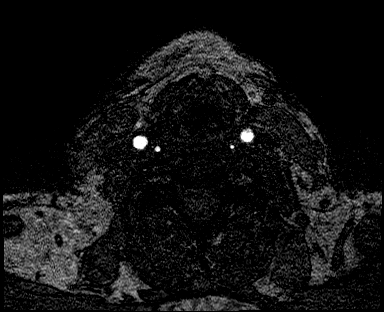
[im 33/121]
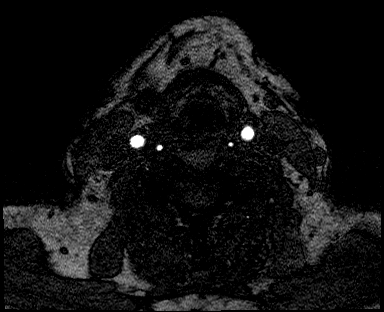
[im 55/121]
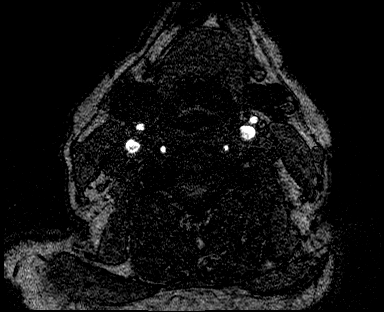
[im 66/121]
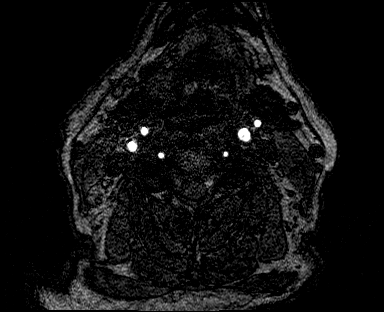
[im 88/121]
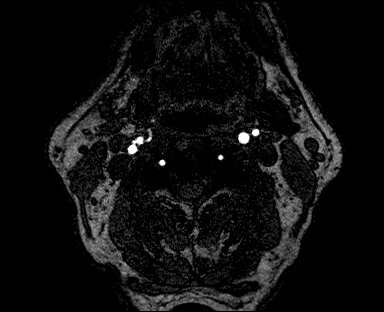
[im 99/121]
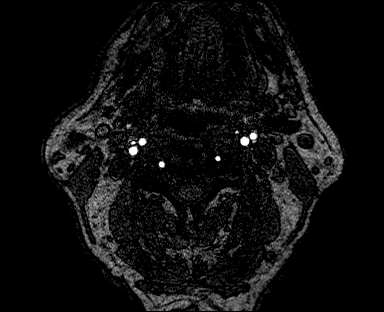
[im 110/121]
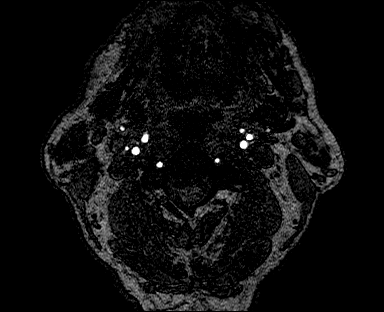
[im 121/121]
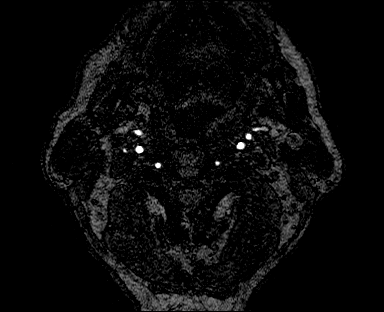

[Series 18: angio_fl3d_cor_pre_ttc=3.0s · coronal · 0.9mm · 0.85mm/px · 9 of 88 slices shown]
[im 1/88]
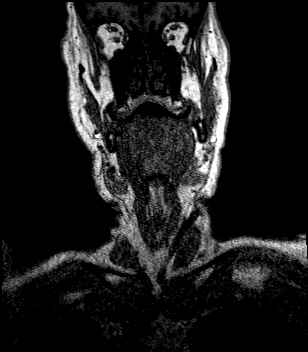
[im 11/88]
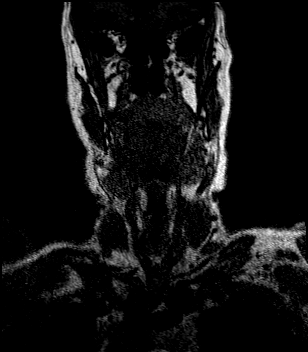
[im 22/88]
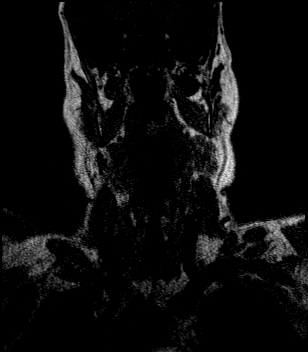
[im 33/88]
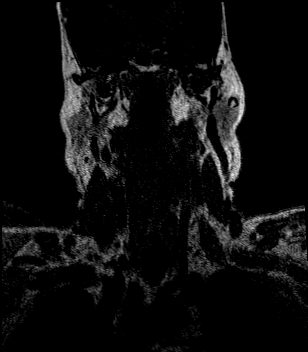
[im 44/88]
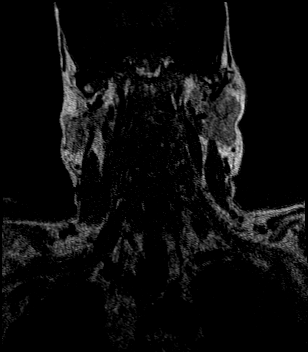
[im 55/88]
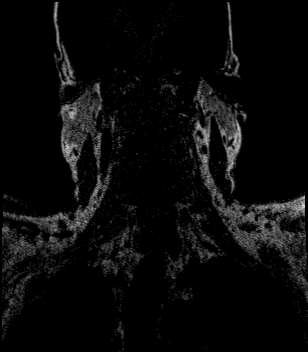
[im 66/88]
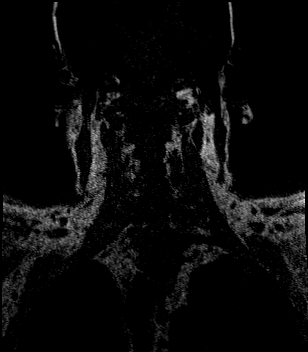
[im 77/88]
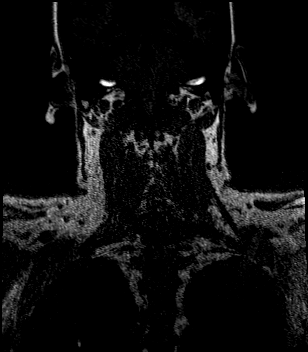
[im 88/88]
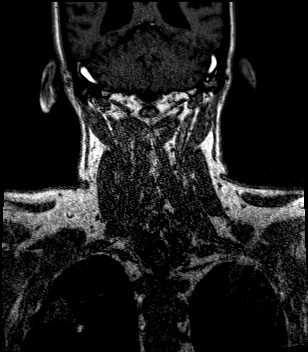

[Series 20: angio_fl3d_cor_post_ttc=3.0s · coronal · 0.9mm · 0.85mm/px · 9 of 88 slices shown]
[im 1/88]
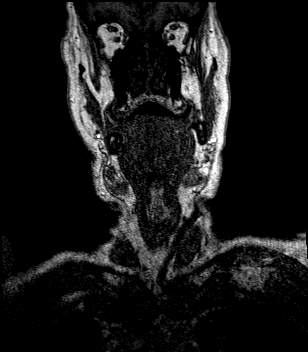
[im 11/88]
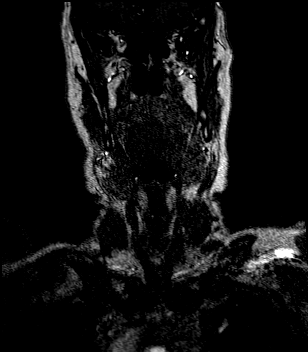
[im 22/88]
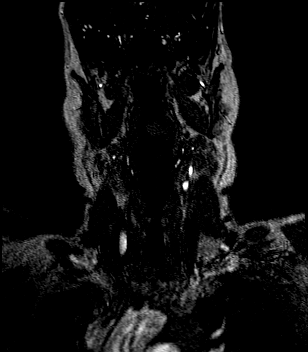
[im 33/88]
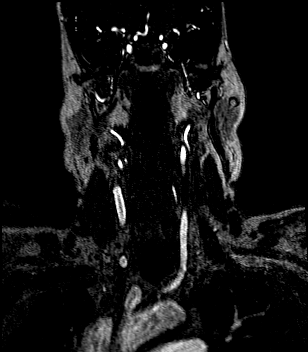
[im 44/88]
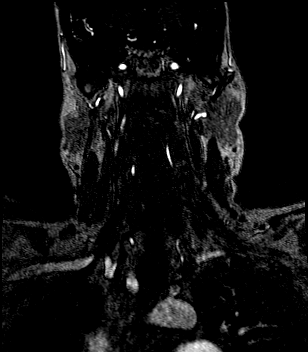
[im 55/88]
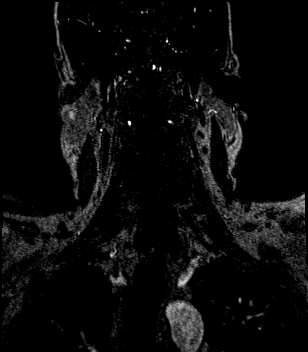
[im 66/88]
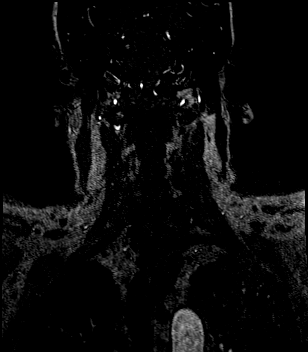
[im 77/88]
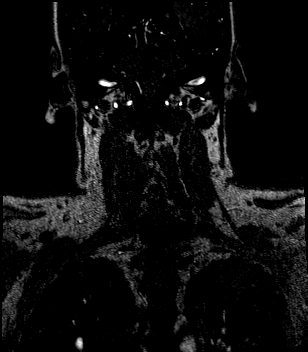
[im 88/88]
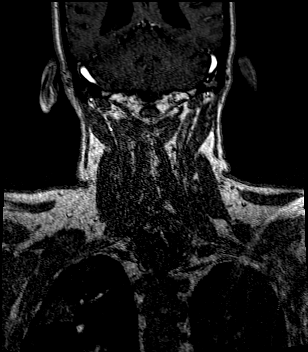

[Series 21: angio_fl3d_cor_post_ttc=3.0s_moco-adv · coronal · 0.9mm · 0.85mm/px · 9 of 88 slices shown]
[im 1/88]
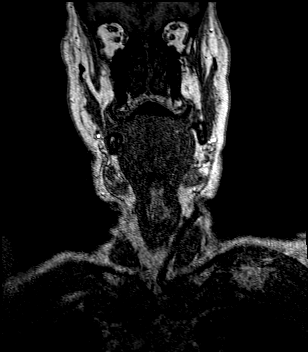
[im 11/88]
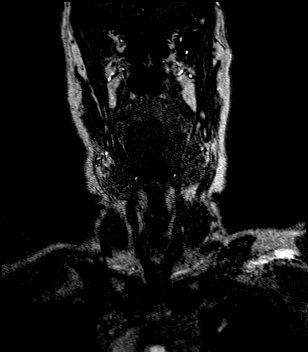
[im 22/88]
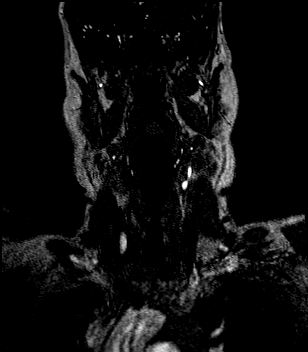
[im 33/88]
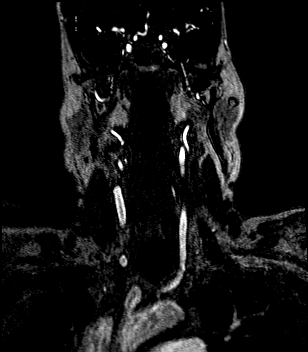
[im 44/88]
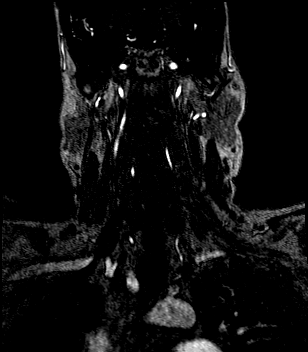
[im 55/88]
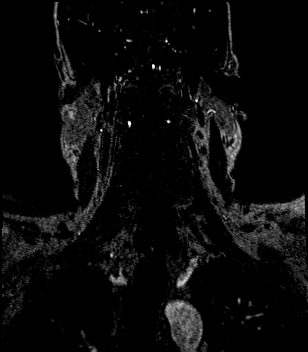
[im 66/88]
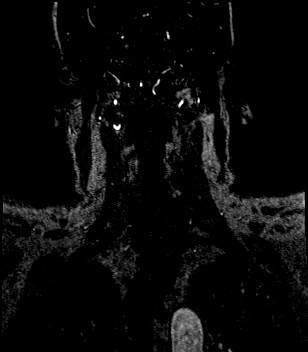
[im 77/88]
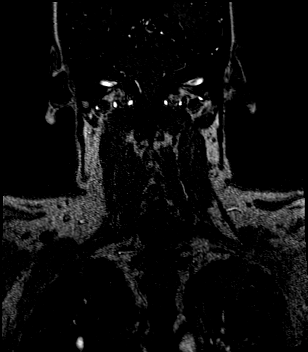
[im 88/88]
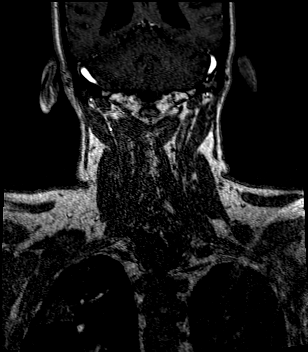

[Series 22: angio_fl3d_cor_post_ttc=3.0s_moco-adv_sub · coronal · 0.9mm · 0.85mm/px · 8 of 88 slices shown]
[im 1/88]
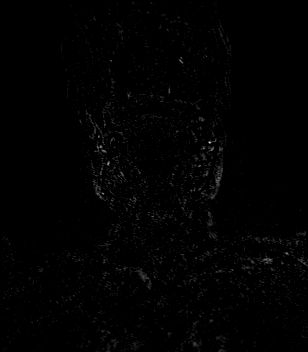
[im 11/88]
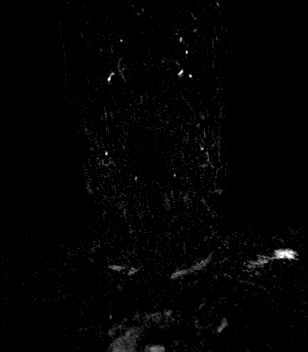
[im 22/88]
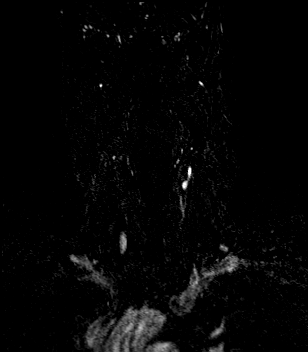
[im 33/88]
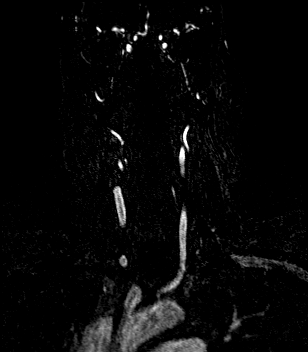
[im 55/88]
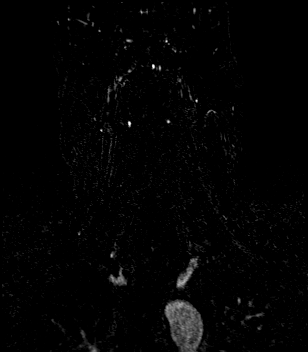
[im 66/88]
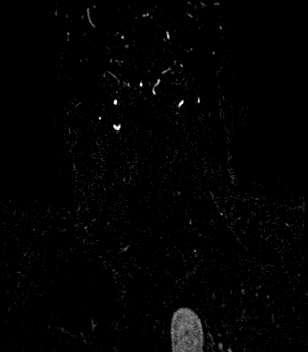
[im 77/88]
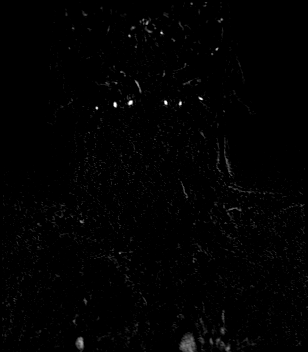
[im 88/88]
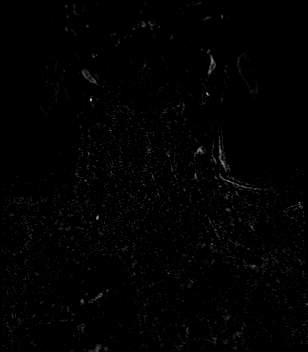

[44 of 48 positions shown; findings below may reference images not displayed]

FINDINGS: MRA NECK FINDINGS

Normal carotid and vertebral arteries. 3 vessel branching pattern of
the aorta. Normal visualized subclavian arteries.

MRA HEAD FINDINGS

POSTERIOR CIRCULATION:

--Vertebral arteries: Normal

--Inferior cerebellar arteries: Normal.

--Basilar artery: Normal.

--Superior cerebellar arteries: Normal.

--Posterior cerebral arteries: Normal.

ANTERIOR CIRCULATION:

--Intracranial internal carotid arteries: Normal.

--Anterior cerebral arteries (ACA): Normal.

--Middle cerebral arteries (MCA): Normal.

ANATOMIC VARIANTS: Fetal origin of the right PCA.
IMPRESSION: Normal MRA of the head and neck.

## 2020-10-27 MED ORDER — ATORVASTATIN CALCIUM 40 MG PO TABS
40.0000 mg | ORAL_TABLET | Freq: Every day | ORAL | Status: DC
  Administered 2020-10-27: 40 mg via ORAL
  Filled 2020-10-27: qty 1

## 2020-10-27 MED ORDER — GADOBUTROL 1 MMOL/ML IV SOLN
10.0000 mL | Freq: Once | INTRAVENOUS | Status: AC | PRN
  Administered 2020-10-27: 10 mL via INTRAVENOUS

## 2020-10-27 MED ORDER — CLOPIDOGREL BISULFATE 75 MG PO TABS
75.0000 mg | ORAL_TABLET | Freq: Every day | ORAL | Status: DC
  Filled 2020-10-27: qty 1

## 2020-10-27 MED ORDER — PANTOPRAZOLE SODIUM 40 MG PO TBEC
40.0000 mg | DELAYED_RELEASE_TABLET | Freq: Every day | ORAL | Status: DC
  Administered 2020-10-27: 40 mg via ORAL
  Filled 2020-10-27: qty 1

## 2020-10-27 MED ORDER — ASPIRIN 81 MG PO CHEW
81.0000 mg | CHEWABLE_TABLET | Freq: Every morning | ORAL | Status: DC
  Administered 2020-10-27: 81 mg via ORAL
  Filled 2020-10-27: qty 1

## 2020-10-27 NOTE — Progress Notes (Signed)
  Echocardiogram 2D Echocardiogram has been performed.  Samuel Moore 10/27/2020, 9:31 AM

## 2020-10-27 NOTE — Care Management CC44 (Signed)
Condition Code 44 Documentation Completed  Patient Details  Name: GABRIEN MENTINK MRN: [id number] Date of Birth: [birth year]   Condition Code 44 given:  Yes Patient signature on Condition Code 44 notice:  Yes Documentation of 2 MD's agreement:  Yes Code 44 added to claim:  Yes    Carles Collet, RN 10/27/2020, 3:12 PM

## 2020-10-27 NOTE — Progress Notes (Addendum)
STROKE TEAM PROGRESS NOTE   INTERVAL HISTORY No family is at the bedside.  Patient recounted HPI with me.  He stated that yesterday afternoon, he was sitting down eating in the restaurant, he felt no taste on eating everything, then felt generalized weakness, imbalance on walking and then he blacked out for brief period.  911 was called and then he was sent to ER for evaluation.  He denies any lateralizing weakness at that time.  According to note, his symptom resolved in ER.  MRI currently negative, MRA head and neck negative.  In 05/2019 he had episode of right-sided weakness and aphasia status post TPA at [facility].  MRI negative.  TTE and TEE both negative.  He had 30-day Cardic event monitoring at all with patient showed no A. fib.  In 08/2019 he was again presented to ED for right-sided weakness and then bilateral weakness, low suspicion for stroke at that time, more concerning for anxiety at that time.  He is on aspirin 81 Lipitor 80 at home.  He had a history of lung cancer status post resection, bladder cancer stat post cystectomy.  Recently found to have pancytopenia, had a bone marrow biopsy in [place], pending result.  Currently his WBC 3.4, hemoglobin 10.0, platelet 56.  OBJECTIVE Vitals:   10/27/20 1000 10/27/20 1015 10/27/20 1030 10/27/20 1045  BP: (!) 151/82 (!) 159/86 (!) 148/86 138/85  Pulse: 74 73 74 72  Resp: 15 16 20 20   Temp:      TempSrc:      SpO2: 98% 96% 96% 95%  Weight:      Height:        CBC:  Recent Labs  Lab 10/26/20 1843 10/26/20 1843 10/26/20 1848 10/27/20 0502  WBC 3.4*  --   --  3.4*  NEUTROABS 2.3  --   --  2.4  HGB 10.1*   < > 11.6* 10.0*  HCT 35.1*   < > 34.0* 34.5*  MCV 89.3  --   --  88.5  PLT 59*  --   --  56*   < > = values in this interval not displayed.    Basic Metabolic Panel:  Recent Labs  Lab 10/26/20 1843 10/26/20 1843 10/26/20 1848 10/27/20 0502  NA 138   < > 140 138  K 4.3   < > 4.2 4.3  CL 106   < > 104 107  CO2 24  --   --  25   GLUCOSE 114*   < > 106* 127*  BUN 18   < > 20 17  CREATININE 1.37*   < > 1.40* 1.29*  CALCIUM 8.8*  --   --  8.5*   < > = values in this interval not displayed.    Lipid Panel:     Component Value Date/Time   CHOL 122 10/27/2020 0503   TRIG 110 10/27/2020 0503   HDL 35 (L) 10/27/2020 0503   CHOLHDL 3.5 10/27/2020 0503   VLDL 22 10/27/2020 0503   LDLCALC 65 10/27/2020 0503   HgbA1c:  Lab Results  Component Value Date   HGBA1C 6.0 (H) 10/27/2020   Urine Drug Screen:     Component Value Date/Time   LABOPIA NONE DETECTED 10/27/2020 1004   COCAINSCRNUR NONE DETECTED 10/27/2020 1004   COCAINSCRNUR NONE DETECTED 09/26/2019 2049   LABBENZ NONE DETECTED 10/27/2020 1004   AMPHETMU NONE DETECTED 10/27/2020 1004   THCU NONE DETECTED 10/27/2020 1004   LABBARB NONE DETECTED 10/27/2020 1004  Alcohol Level     Component Value Date/Time   ETH <10 09/26/2019 1913    IMAGING  MR ANGIO HEAD WO CONTRAST MR ANGIO NECK W WO CONTRAST 10/27/2020 IMPRESSION:  Normal MRA of the head and neck.   MR BRAIN WO CONTRAST 10/26/2020 IMPRESSION:  1. No acute intracranial abnormality. 2. Chronic bilateral cerebellar infarcts.   CT HEAD CODE STROKE WO CONTRAST 10/26/2020 IMPRESSION:  1. No evidence of acute intracranial abnormality.  2. ASPECTS is 10.  3. Small chronic cerebellar infarct.   ECHOCARDIOGRAM COMPLETE 10/27/2020 IMPRESSIONS   1. Left ventricular ejection fraction, by estimation, is 50 to 55%. The left ventricle has low normal function. The left ventricle has no regional wall motion abnormalities. Left ventricular diastolic parameters are consistent with Grade I diastolic dysfunction (impaired relaxation).   2. Right ventricular systolic function is normal. The right ventricular size is normal.   3. The mitral valve is normal in structure. Trivial mitral valve regurgitation. No evidence of mitral stenosis.   4. The aortic valve is tricuspid. Aortic valve regurgitation is  not visualized. Mild aortic valve sclerosis is present, with no evidence of aortic valve stenosis.   5. The inferior vena cava is normal in size with greater than 50% respiratory variability, suggesting right atrial pressure of 3 mmHg.   ECG - SR rate 81 BPM. (See cardiology reading for complete details)  PHYSICAL EXAM  Temp:  [98.2 F (36.8 C)-98.7 F (37.1 C)] 98.5 F (36.9 C) (11/14 1601) Pulse Rate:  [65-93] 76 (11/14 1601) Resp:  [9-22] 18 (11/14 1601) BP: (128-168)/(62-111) 128/77 (11/14 1601) SpO2:  [94 %-99 %] 94 % (11/14 1601) Weight:  [92.9 kg-95.2 kg] 92.9 kg (11/14 1230)  General - Well nourished, well developed, in no apparent distress.  Ophthalmologic - fundi not visualized due to noncooperation.  Cardiovascular - Regular rhythm and rate.  Mental Status -  Level of arousal and orientation to time, place, and person were intact. Language including expression, naming, repetition, comprehension was assessed and found intact. Fund of Knowledge was assessed and was intact.  Cranial Nerves II - XII - II - Visual field intact OU. III, IV, VI - Extraocular movements intact. V - Facial sensation intact bilaterally. VII - Facial movement intact bilaterally. VIII - Hearing & vestibular intact bilaterally. X - Palate elevates symmetrically. XI - Chin turning & shoulder shrug intact bilaterally. XII - Tongue protrusion intact.  Motor Strength - The patient's strength was normal in all extremities and pronator drift was absent.  Bulk was normal and fasciculations were absent.   Motor Tone - Muscle tone was assessed at the neck and appendages and was normal.  Reflexes - The patient's reflexes were symmetrical in all extremities and he had no pathological reflexes.  Sensory - Light touch, temperature/pinprick were assessed and were symmetrical.    Coordination - The patient had normal movements in the hands with no ataxia or dysmetria.  Tremor was absent.  Gait and  Station - deferred.   ASSESSMENT/PLAN [name] is a 69 y.o. male with history of COPD, AAA, OSA, HTN, HLD, GERD, pancytopenia, stroke (RLE weakness) [facility] June 2020 -> tPA with negative TEE, hx of loop implant, bladder cancer (bladder removed) lung cancer (Lt lower lobectomy), and  s/p prostatectomy presenting with confusion and generalized weakness with possible brief LOC. He did not receive IV t-PA due to NIHSS - 0.  TIA vs. Syncope vs. Seizure-like activity   CT Head - No evidence of acute intracranial  abnormality. Small chronic cerebellar infarct.      MRI head - No acute intracranial abnormality. Chronic bilateral cerebellar infarcts.   MRA H&N - Normal MRA of the head and neck.   2D Echo - EF 50 to 55%. No cardiac source of emboli identified.   EEG normal  [employer] Virus 2 - negative  LDL - 65  HgbA1c - 6.0  UDS - negative  VTE prophylaxis - SCDs  aspirin 81 mg daily prior to admission, now on aspirin 81 mg daily.  Continue aspirin monotherapy on discharge given thrombocytopenia  No AED recommended at this time  Patient counseled to be compliant with his antithrombotic medications  Ongoing aggressive stroke risk factor management  Therapy recommendations:  pending  Disposition:  Pending  History of strokelike episodes  05/2019 he had episode of right-sided weakness and aphasia status post TPA at [facility].  MRI negative.  TTE and TEE both negative.  He had 30-day Cardic event monitoring at all with patient showed no A. fib.   08/2019 he was again presented to ED for right-sided weakness and then bilateral weakness, low suspicion for stroke at that time, more concerning for anxiety at that time.    He is on aspirin 81 Lipitor 80 at home.  Pancytopenia  bone marrow biopsy in [place], pending result.    Currently his WBC 3.4, hemoglobin 10.0, platelet 56.  [facility]  Hyperlipidemia  Home Lipid lowering medication: Lipitor 80 mg  daily  LDL 65, goal < 70  Current lipid lowering medication: Lipitor 40 mg daily   Continue statin at discharge  Other Stroke Risk Factors  Advanced age  Former cigarette smoker - quit  Obstructive sleep apnea  Other Active Problems Code status - Full code CKD - stage 3a - creatinine - 1.29  History of lung cancer status post resection  History of bladder cancer status post cystectomy  Hospital day # 0  Neurology will sign off. Please call with questions. Pt will follow up with Dr. Marin Comment at [facility] neurology in about 4 weeks. Thanks for the consult.  Rosalin Hawking, MD PhD Stroke Neurology 10/27/2020 5:39 PM  To contact Stroke Continuity provider, please refer to [email or web address] After hours, contact General Neurology

## 2020-10-27 NOTE — ED Notes (Signed)
Patient finished his breakfast tray and ECHO now at bedside. He is alert and oriented with no distress noted.

## 2020-10-27 NOTE — ED Notes (Signed)
Pt back from MRI 

## 2020-10-27 NOTE — Evaluation (Signed)
Occupational Therapy Evaluation Patient Details Name: Samuel Moore MRN: [id number] DOB: Jul 02, 1951 Today's Date: 10/27/2020    History of Present Illness This 69 y.o. male admitted with Lt sided weakness.  CT and MRI of brain negative for acute intracranial abnormality, but does have chronic cerebellar infarcts bil.  PMH includes: COPD, CVA, bladder and lung CA    Clinical Impression   Patient evaluated by Occupational Therapy with no further acute OT needs identified. All education has been completed and the patient has no further questions. Pt appears to be back to baseline.  He is able to perform ADLs and functional mobility at mod I level using RW which he used PTA.   See below for any follow-up Occupational Therapy or equipment needs. OT is signing off. Thank you for this referral.     Follow Up Recommendations  No OT follow up    Equipment Recommendations  None recommended by OT    Recommendations for Other Services       Precautions / Restrictions Precautions Precautions: Fall Precaution Comments: Pt reports one fall ~3-4 mos ago associated with acute onset of dizziness.  He reports no further falls, and did undergo PT which resolved the dizziness       Mobility Bed Mobility Overal bed mobility: Independent                  Transfers Overall transfer level: Modified independent                    Balance Overall balance assessment: Mild deficits observed, not formally tested                                         ADL either performed or assessed with clinical judgement   ADL Overall ADL's : Modified independent;At baseline                                       General ADL Comments: pt able to perform tub transfer with supervision      Vision Baseline Vision/History: No visual deficits Patient Visual Report: No change from baseline Vision Assessment?: Yes Eye Alignment: Within Functional Limits Ocular  Range of Motion: Within Functional Limits Alignment/Gaze Preference: Within Defined Limits Tracking/Visual Pursuits: Able to track stimulus in all quads without difficulty Convergence: Within functional limits Additional Comments: Pt initially missing items on Lt with bil. stimuli, but on repeat testing able to count fingers and locate items during confrontation testing      Perception Perception Perception Tested?: Yes   Praxis Praxis Praxis tested?: Within functional limits    Pertinent Vitals/Pain Pain Assessment: No/denies pain     Hand Dominance Right   Extremity/Trunk Assessment Upper Extremity Assessment Upper Extremity Assessment: Overall WFL for tasks assessed   Lower Extremity Assessment Lower Extremity Assessment: Overall WFL for tasks assessed   Cervical / Trunk Assessment Cervical / Trunk Assessment: Normal   Communication Communication Communication: No difficulties   Cognition Arousal/Alertness: Awake/alert Behavior During Therapy: WFL for tasks assessed/performed Overall Cognitive Status: Within Functional Limits for tasks assessed                                     General Comments  Exercises     Shoulder Instructions      Home Living Family/patient expects to be discharged to:: Private residence Living Arrangements: Spouse/significant other Available Help at Discharge: Family;Available 24 hours/day Type of Home: House Home Access: Stairs to enter [employer] of Steps: 3 Entrance Stairs-Rails: None Home Layout: Two level;Able to live on main level with bedroom/bathroom Alternate Level Stairs-Number of Steps: full flight upstairs - pt states he does not go up there    Bathroom Shower/Tub: Tub/shower unit;Curtain   Bathroom Toilet: Standard Bathroom Accessibility: Yes How Accessible: Accessible via walker Home Equipment: Walker - 2 wheels;Walker - 4 wheels;Shower seat;Toilet riser;Grab bars - tub/shower;Hand  held shower head          Prior Functioning/Environment Level of Independence: Independent with assistive device(s)        Comments: Pt reports he uses RW since his fall.  He reports he is independent with ADLs, financial management, driving, medication management         OT Problem List: Impaired balance (sitting and/or standing)      OT Treatment/Interventions:      OT Goals(Current goals can be found in the care plan section) Acute Rehab OT Goals Patient Stated Goal: did not state  OT Goal Formulation: All assessment and education complete, DC therapy  OT Frequency:     Barriers to D/C:            Co-evaluation              AM-PAC OT "6 Clicks" Daily Activity     Outcome Measure Help from another person eating meals?: None Help from another person taking care of personal grooming?: None Help from another person toileting, which includes using toliet, bedpan, or urinal?: None Help from another person bathing (including washing, rinsing, drying)?: None Help from another person to put on and taking off regular upper body clothing?: None Help from another person to put on and taking off regular lower body clothing?: None 6 Click Score: 24   End of Session Equipment Utilized During Treatment: Rolling walker Nurse Communication: Mobility status  Activity Tolerance: Patient tolerated treatment well Patient left: in bed;with call bell/phone within reach;with bed alarm set  OT Visit Diagnosis: Unsteadiness on feet (R26.81)                Time: 1249-1311 OT Time Calculation (min): 22 min Charges:  OT General Charges $OT Visit: 1 Visit OT Evaluation $OT Eval Low Complexity: 1 Low  Nilsa Nutting., OTR/L Acute Rehabilitation Services Pager [phone] Office [phone]   Lucille Passy M 10/27/2020, 1:38 PM

## 2020-10-27 NOTE — Progress Notes (Signed)
Discharge instructions reviewed with patient and his wife. Patient verbalized understanding. Patient has his cell phone at bedside. Tele and IV removed. Patient left unit in a wheelchair and was transported home by his wife.

## 2020-10-27 NOTE — Progress Notes (Signed)
SLP Cancellation Note  Patient Details Name: Samuel Moore MRN: [id number] DOB: 08-Jun-1951   Cancelled treatment:       Reason Eval/Treat Not Completed: SLP screened, no needs identified, will sign off. Per OT's note pt's speech, language, and cognitive skills are currently at baseline. Formal evaluation is therefore not clinically indicated at this time.   [name] I. [name], [place], [place] Office number [phone] Pager [phone]   [name] 10/27/2020, 5:07 PM

## 2020-10-27 NOTE — Progress Notes (Signed)
EEG complete - results pending 

## 2020-10-27 NOTE — ED Notes (Signed)
Patient transported to CT 

## 2020-10-27 NOTE — ED Notes (Signed)
Linens changed, urine bag emptied, patient positioned up in the bed and resting comfortably with wife at bedside and watching TV. No distress. Ice water within reach as well as call bell. Side rails up x 2 and bed in low and locked position.

## 2020-10-27 NOTE — Procedures (Signed)
Patient Name: Samuel Moore  MRN: [id number]  EEG Attending: Roland Rack  Referring Physician/Provider:  Lavera Guise Date: 10/27/2020 Duration: 25 minutes  Patient history: 69 year old male being evaluated for transient left-sided weakness and confusion  Level of alertness: Awake and asleep  AEDs during EEG study: None  Technical aspects: This EEG study was done with scalp electrodes positioned according to the 10-20 International system of electrode placement. Electrical activity was acquired at a sampling rate of 500Hz  and reviewed with a high frequency filter of 70Hz  and a low frequency filter of 1Hz . EEG data were recorded continuously and digitally stored.   BACKGROUND ACTIVITY: Posterior dominant rhythm: The posterior dominant rhythm consists of 10 hz activity of moderate voltage (25-35 uV) seen predominantly in posterior head regions, symmetric and reactive to eye opening and eye closing.          Slowing: None  EPILEPTIFORM ACTIVITY: Interictal epileptiform activity: None  Ictal Activity: None  OTHER EVENTS: None  SLEEP RECORDINGS:  Sleep was recorded with symmetrically distributed structures.  ACTIVATION PROCEDURES:  Hyperventilation and photic stimulation were not performed.  IMPRESSION: This study is within normal limits. There was no seizure or evidence of seizure predisposition recorded on this study. Please note that lack of epileptiform activity on EEG does not preclude the possibility of epilepsy.    Roland Rack, MD [facility] [phone]  If 7pm- 7am, please page neurology on call as listed in [place].

## 2020-10-27 NOTE — Discharge Summary (Signed)
[facility] Discharge Summary  Patient name: BURLEIGH BROCKMANN Medical record number: [id number] Date of birth: May 24, 1951 Age: 69 y.o. Gender: male Date of Admission: 10/26/2020  Date of Discharge: 10/27/20    Admitting Physician: Kinnie Feil, MD  Primary Care Provider: [facility]: neurology   Indication for Hospitalization: Left sided weakness, fatigue   Discharge Diagnoses/Problem List:  Active Problems:   TIA (transient ischemic attack)   Disposition: Discharge to home  Discharge Condition: Good  Discharge Exam:  BP 128/77 (BP Location: Left Arm)   Pulse 76   Temp 98.5 F (36.9 C) (Oral)   Resp 18   Ht 6' (1.829 m)   Wt 92.9 kg   SpO2 94%   BMI 27.78 kg/m  See progress note physical exam   Brief Hospital Course:  WAHID HOLLEY is a 69 y.o. 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  Patient's initial work-up with MRI head, MRA head and neck, EKG, echocardiogram were all within normal limits.  His left-sided weakness resolved while he was in the hospital.  Neurology was consulted and reviewed initial work-up. They agreed that no stroke occurred but did want to rule out seizure and EEG was obtained. EEG was normal.  They recommended continuing atorvastatin and aspirin at discharge.  He was not discharged on Plavix due to his pancytopenia which is currently being worked up outpatient.  PT, OT, SLP saw him in the emergency department and had no further recommendations at discharge.  Issues for Follow Up:  1. Any recurrence of neurologic changes 2. Continuation of asa & statins   Significant Procedures:   Procedure Orders     ED EKG     EKG 12-Lead     ECHOCARDIOGRAM COMPLETE  Significant Labs and Imaging:  Recent Labs  Lab 10/26/20 1843 10/26/20 1848 10/27/20 0502  WBC 3.4*  --  3.4*  HGB 10.1* 11.6*  10.0*  HCT 35.1* 34.0* 34.5*  PLT 59*  --  56*   Recent Labs  Lab 10/26/20 1843 10/26/20 1843 10/26/20 1848 10/27/20 0502  NA 138  --  140 138  K 4.3   < > 4.2 4.3  CL 106  --  104 107  CO2 24  --   --  25  GLUCOSE 114*  --  106* 127*  BUN 18  --  20 17  CREATININE 1.37*  --  1.40* 1.29*  CALCIUM 8.8*  --   --  8.5*  ALKPHOS 70  --   --   --   AST 15  --   --   --   ALT 13  --   --   --   ALBUMIN 3.5  --   --   --    < > = values in this interval not displayed.    MR ANGIO HEAD WO CONTRAST  Result Date: 10/27/2020 CLINICAL DATA:  Stroke EXAM: MRA NECK WITHOUT AND WITH CONTRAST MRA HEAD WITHOUT CONTRAST TECHNIQUE: Multiplanar and multiecho pulse sequences of the neck were obtained without and with intravenous contrast. Angiographic images of the neck were obtained using MRA technique without and with intravenous contrast.; Angiographic images of the Circle of Willis were obtained using MRA technique without intravenous contrast. CONTRAST:  58mL GADAVIST GADOBUTROL 1 MMOL/ML IV SOLN COMPARISON:  None. FINDINGS: MRA NECK FINDINGS Normal carotid and vertebral arteries. 3 vessel branching  pattern of the aorta. Normal visualized subclavian arteries. MRA HEAD FINDINGS POSTERIOR CIRCULATION: --Vertebral arteries: Normal --Inferior cerebellar arteries: Normal. --Basilar artery: Normal. --Superior cerebellar arteries: Normal. --Posterior cerebral arteries: Normal. ANTERIOR CIRCULATION: --Intracranial internal carotid arteries: Normal. --Anterior cerebral arteries (ACA): Normal. --Middle cerebral arteries (MCA): Normal. ANATOMIC VARIANTS: Fetal origin of the right PCA. IMPRESSION: Normal MRA of the head and neck. Electronically Signed   By: Ulyses Jarred M.D.   On: 10/27/2020 03:08   MR ANGIO NECK W WO CONTRAST  Result Date: 10/27/2020 CLINICAL DATA:  Stroke EXAM: MRA NECK WITHOUT AND WITH CONTRAST MRA HEAD WITHOUT CONTRAST TECHNIQUE: Multiplanar and multiecho pulse sequences of the neck were  obtained without and with intravenous contrast. Angiographic images of the neck were obtained using MRA technique without and with intravenous contrast.; Angiographic images of the Circle of Willis were obtained using MRA technique without intravenous contrast. CONTRAST:  61mL GADAVIST GADOBUTROL 1 MMOL/ML IV SOLN COMPARISON:  None. FINDINGS: MRA NECK FINDINGS Normal carotid and vertebral arteries. 3 vessel branching pattern of the aorta. Normal visualized subclavian arteries. MRA HEAD FINDINGS POSTERIOR CIRCULATION: --Vertebral arteries: Normal --Inferior cerebellar arteries: Normal. --Basilar artery: Normal. --Superior cerebellar arteries: Normal. --Posterior cerebral arteries: Normal. ANTERIOR CIRCULATION: --Intracranial internal carotid arteries: Normal. --Anterior cerebral arteries (ACA): Normal. --Middle cerebral arteries (MCA): Normal. ANATOMIC VARIANTS: Fetal origin of the right PCA. IMPRESSION: Normal MRA of the head and neck. Electronically Signed   By: Ulyses Jarred M.D.   On: 10/27/2020 03:08   MR BRAIN WO CONTRAST  Result Date: 10/26/2020 CLINICAL DATA:  Acute left-sided weakness and fatigue. History of stroke. EXAM: MRI HEAD WITHOUT CONTRAST TECHNIQUE: Multiplanar, multiecho pulse sequences of the brain and surrounding structures were obtained without intravenous contrast. COMPARISON:  Head CT 10/26/2020 FINDINGS: Brain: There is no evidence of an acute infarct, intracranial hemorrhage, mass, midline shift, or extra-axial fluid collection. Mild cerebral atrophy is within normal limits for age. There are small chronic bilateral cerebellar infarcts. Scattered punctate foci of T2 hyperintensity in the cerebral white matter bilaterally are nonspecific but compatible with minimal chronic small vessel ischemic disease which is not considered advanced for age. Vascular: Major intracranial vascular flow voids are preserved. Skull and upper cervical spine: Unremarkable bone marrow signal. Sinuses/Orbits:  Bilateral cataract extraction. Paranasal sinuses and mastoid air cells are clear. Other: None. IMPRESSION: 1. No acute intracranial abnormality. 2. Chronic bilateral cerebellar infarcts. Electronically Signed   By: Logan Bores M.D.   On: 10/26/2020 21:53   EEG adult  Result Date: 10/27/2020 Greta Doom, MD     10/27/2020  5:17 PM Patient Name: AZEL GUMINA MRN: [id number] EEG Attending: Roland Rack Referring Physician/Provider:  Lavera Guise Date: 10/27/2020 Duration: 25 minutes Patient history: 68 year old male being evaluated for transient left-sided weakness and confusion Level of alertness: Awake and asleep AEDs during EEG study: None Technical aspects: This EEG study was done with scalp electrodes positioned according to the 10-20 International system of electrode placement. Electrical activity was acquired at a sampling rate of 500Hz  and reviewed with a high frequency filter of 70Hz  and a low frequency filter of 1Hz . EEG data were recorded continuously and digitally stored. BACKGROUND ACTIVITY: Posterior dominant rhythm: The posterior dominant rhythm consists of 10 hz activity of moderate voltage (25-35 uV) seen predominantly in posterior head regions, symmetric and reactive to eye opening and eye closing.        Slowing: None EPILEPTIFORM ACTIVITY: Interictal epileptiform activity: None Ictal Activity: None OTHER EVENTS: None SLEEP RECORDINGS: Sleep was  recorded with symmetrically distributed structures. ACTIVATION PROCEDURES: Hyperventilation and photic stimulation were not performed. IMPRESSION: This study is within normal limits. There was no seizure or evidence of seizure predisposition recorded on this study. Please note that lack of epileptiform activity on EEG does not preclude the possibility of epilepsy.  Roland Rack, MD [facility] [phone] If 7pm- 7am, please page neurology on call as listed in [place].   ECHOCARDIOGRAM COMPLETE  Result Date:  10/27/2020    ECHOCARDIOGRAM REPORT   Patient Name:   KIRIN PASTORINO Date of Exam: 10/27/2020 Medical Rec #:  [id number]       Height:       72.0 in Accession #:    [id number]      Weight:       209.9 lb Date of Birth:  Mar 13, 1951       BSA:          2.175 m Patient Age:    77 years        BP:           168/97 mmHg Patient Gender: M               HR:           77 bpm. Exam Location:  Inpatient Procedure: 2D Echo, Cardiac Doppler and Color Doppler Indications:    Stroke 434.91 / I163.9  History:        Patient has no prior history of Echocardiogram examinations.                 COPD; Risk Factors:Hypertension. Cancer.  Sonographer:    Tiffany Dance Referring Phys: [id number] [place]  1. Left ventricular ejection fraction, by estimation, is 50 to 55%. The left ventricle has low normal function. The left ventricle has no regional wall motion abnormalities. Left ventricular diastolic parameters are consistent with Grade I diastolic dysfunction (impaired relaxation).  2. Right ventricular systolic function is normal. The right ventricular size is normal.  3. The mitral valve is normal in structure. Trivial mitral valve regurgitation. No evidence of mitral stenosis.  4. The aortic valve is tricuspid. Aortic valve regurgitation is not visualized. Mild aortic valve sclerosis is present, with no evidence of aortic valve stenosis.  5. The inferior vena cava is normal in size with greater than 50% respiratory variability, suggesting right atrial pressure of 3 mmHg. FINDINGS  Left Ventricle: Left ventricular ejection fraction, by estimation, is 50 to 55%. The left ventricle has low normal function. The left ventricle has no regional wall motion abnormalities. The left ventricular internal cavity size was normal in size. There is no left ventricular hypertrophy. Left ventricular diastolic parameters are consistent with Grade I diastolic dysfunction (impaired relaxation). Right Ventricle: The right ventricular  size is normal.Right ventricular systolic function is normal. Left Atrium: Left atrial size was normal in size. Right Atrium: Right atrial size was normal in size. Pericardium: There is no evidence of pericardial effusion. Mitral Valve: The mitral valve is normal in structure. Trivial mitral valve regurgitation. No evidence of mitral valve stenosis. Tricuspid Valve: The tricuspid valve is normal in structure. Tricuspid valve regurgitation is trivial. No evidence of tricuspid stenosis. Aortic Valve: The aortic valve is tricuspid. Aortic valve regurgitation is not visualized. Mild aortic valve sclerosis is present, with no evidence of aortic valve stenosis. Pulmonic Valve: The pulmonic valve was grossly normal. Pulmonic valve regurgitation is not visualized. No evidence of pulmonic stenosis. Aorta: The aortic root is normal in size and  structure. Venous: The inferior vena cava is normal in size with greater than 50% respiratory variability, suggesting right atrial pressure of 3 mmHg. IAS/Shunts: No atrial level shunt detected by color flow Doppler.  LEFT VENTRICLE PLAX 2D LVIDd:         4.96 cm  Diastology LVIDs:         3.78 cm  LV e' medial:    4.68 cm/s LV PW:         0.93 cm  LV E/e' medial:  14.9 LV IVS:        0.80 cm  LV e' lateral:   9.25 cm/s LVOT diam:     2.30 cm  LV E/e' lateral: 7.5 LV SV:         70 LV SV Index:   32 LVOT Area:     4.15 cm  RIGHT VENTRICLE             IVC RV Basal diam:  2.54 cm     IVC diam: 1.73 cm RV S prime:     13.50 cm/s TAPSE (M-mode): 2.3 cm LEFT ATRIUM             Index       RIGHT ATRIUM           Index LA diam:        3.50 cm 1.61 cm/m  RA Area:     18.00 cm LA Vol (A2C):   85.5 ml 39.32 ml/m RA Volume:   51.60 ml  23.73 ml/m LA Vol (A4C):   50.3 ml 23.13 ml/m LA Biplane Vol: 69.7 ml 32.05 ml/m  AORTIC VALVE LVOT Vmax:   65.30 cm/s LVOT Vmean:  43.600 cm/s LVOT VTI:    0.168 m  AORTA Ao Root diam: 3.20 cm Ao Asc diam:  2.70 cm MITRAL VALVE MV Area (PHT): 2.45 cm      SHUNTS MV Decel Time: 310 msec     Systemic VTI:  0.17 m MV E velocity: 69.80 cm/s   Systemic Diam: 2.30 cm MV A velocity: 119.00 cm/s MV E/A ratio:  0.59 Kirk Ruths MD Electronically signed by Kirk Ruths MD Signature Date/Time: 10/27/2020/10:34:16 AM    Final    CT HEAD CODE STROKE WO CONTRAST  Result Date: 10/26/2020 CLINICAL DATA:  Code stroke.  Left-sided weakness. EXAM: CT HEAD WITHOUT CONTRAST TECHNIQUE: Contiguous axial images were obtained from the base of the skull through the vertex without intravenous contrast. COMPARISON:  09/26/2019 FINDINGS: Brain: There is no evidence of an acute infarct, intracranial hemorrhage, mass, midline shift, or extra-axial fluid collection. A small chronic superior right cerebellar infarct is again noted. The ventricles and sulci are within normal limits for age. Vascular: No hyperdense vessel. Skull: No fracture or suspicious osseous lesion. Sinuses/Orbits: Paranasal sinuses and mastoid air cells are clear. Bilateral cataract extraction. Other: None. ASPECTS [facility] Stroke Program Early CT Score) - Ganglionic level infarction (caudate, lentiform nuclei, internal capsule, insula, M1-M3 cortex): 7 - Supraganglionic infarction (M4-M6 cortex): 3 Total score (0-10 with 10 being normal): 10 IMPRESSION: 1. No evidence of acute intracranial abnormality. 2. ASPECTS is 10. 3. Small chronic cerebellar infarct. These results were communicated to Dr. Theda Sers at 7:02 pm on 10/26/2020 by text page via the [facility] messaging system. Electronically Signed   By: Logan Bores M.D.   On: 10/26/2020 19:02    Results/Tests Pending at Time of Discharge:  . None   Discharge Medications:  Allergies as of 10/27/2020  Reactions   Lisinopril-hydrochlorothiazide Other (See Comments)   Renal failure syndrome      Medication List    TAKE these medications   albuterol 108 (90 Base) MCG/ACT inhaler Commonly known as: VENTOLIN HFA Inhale 2 puffs into the lungs 4 (four) times  daily as needed for wheezing or shortness of breath.   aspirin 81 MG chewable tablet Chew 81 mg by mouth in the morning.   atorvastatin 80 MG tablet Commonly known as: LIPITOR Take 40 mg by mouth daily.   Carboxymethylcellulose Sod PF 0.5 % Soln Place 1 drop into both eyes 4 (four) times daily.   cyanocobalamin 1000 MCG tablet Take 1,000 mcg by mouth daily.   pantoprazole 40 MG tablet Commonly known as: PROTONIX Take 40 mg by mouth daily before breakfast.   Spiriva Respimat 2.5 MCG/ACT Aers Generic drug: Tiotropium Bromide Monohydrate Inhale 1 puff into the lungs in the morning and at bedtime.   traZODone 50 MG tablet Commonly known as: DESYREL Take 50 mg by mouth at bedtime as needed for sleep.   Wixela Inhub 250-50 MCG/DOSE Aepb Generic drug: Fluticasone-Salmeterol Inhale 1 puff into the lungs 2 (two) times daily.       Discharge Instructions: Please refer to Patient Instructions section of EMR for full details.  Patient was counseled important signs and symptoms that should prompt return to medical care, changes in medications, dietary instructions, activity restrictions, and follow up appointments.   Follow-Up Appointments: No future appointments.   Wilber Oliphant, MD 10/27/2020, 5:29 PM PGY-3, [place]

## 2020-10-27 NOTE — Discharge Instructions (Addendum)
Dear Samuel Moore,   Thank you for letting us participate in your care! In this section, you will find a brief hospital admission summary of why you were admitted to the hospital, what happened during your admission, your diagnosis/diagnoses, and recommended follow up.   You were admitted because you were experiencing changes in mental status.   You were diagnosed with Transient Ischemic attack. All of your imaging and tests were normal and there was no evidence for stroke or other brain abnormality. Information about transient ischemic attack is attached below.  You were also seen by neurology. They recommended continuing home aspirin 81 mg.   Your mental status improved and you were discharged from the hospital for meeting this goal.    POST-HOSPITAL & CARE INSTRUCTIONS 1. Please let PCP/Specialists know of any changes that were made.  2. Please see medications section of this packet for any medication changes.   DOCTOR'S APPOINTMENT & FOLLOW UP CARE INSTRUCTIONS  Please follow-up with your PCP within a week of discharge for hospital follow-up  Thank you for choosing [facility]! Take care and be well!  [facility]  [place],  [id number] [phone]   Transient Ischemic Attack  A transient ischemic attack (TIA) is a "warning stroke" that causes stroke-like symptoms that go away quickly. A TIA does not cause lasting damage to the brain. But having a TIA is a sign that you may be at risk for a stroke. Lifestyle changes and medical treatments can help prevent a stroke. It is important to know the symptoms of a TIA and what to do. Get help right away, even if your symptoms go away. The symptoms of a TIA are the same as those of a stroke. They can happen fast, and they usually go away within minutes or hours. They can include:  Weakness or loss of feeling in your face, arm, or  leg. This often happens on one side of your body.  Trouble walking.  Trouble moving your arms or legs.  Trouble talking or understanding what people are saying.  Trouble seeing.  Seeing two of one object (double vision).  Feeling dizzy.  Feeling confused.  Loss of balance or coordination.  Feeling sick to your stomach (nauseous) and throwing up (vomiting).  A very bad headache for no reason. What increases the risk? Certain things may make you more likely to have a TIA. Some of these are things that you can change, such as:  Being very overweight (obese).  Using products that contain nicotine or tobacco, such as cigarettes and e-cigarettes.  Taking birth control pills.  Not being active.  Drinking too much alcohol.  Using drugs. Other risk factors include:  Having an irregular heartbeat (atrial fibrillation).  Being African American or Hispanic.  Having had blood clots, stroke, TIA, or heart attack in the past.  Being a woman with a history of high blood pressure in pregnancy (preeclampsia).  Being over the age of 63.  Being male.  Having family history of stroke.  Having the following diseases or conditions: ? High blood pressure. ? High cholesterol. ? Diabetes. ? Heart disease. ? Sickle cell disease. ? Sleep apnea. ? Migraine headache. ? Long-term (chronic) diseases that cause soreness and swelling (inflammation). ? Disorders that affect how your blood clots. Follow these instructions at home: Medicines   Take over-the-counter and prescription medicines only as told by your doctor.  If  you were told to take aspirin or another medicine to thin your blood, take it exactly as told by your doctor. ? Taking too much of the medicine can cause bleeding. ? Taking too little of the medicine may not work to treat the problem. Eating and drinking   Eat 5 or more servings of fruits and vegetables each day.  Follow instructions from your doctor about  your diet. You may need to follow a certain diet to help lower your risk of having a stroke. You may need to: ? Eat a diet that is low in fat and salt. ? Eat foods that contain a lot of fiber. ? Limit the amount of carbohydrates and sugar in your diet.  Limit alcohol intake to 1 drink a day for nonpregnant women and 2 drinks a day for men. One drink equals 12 oz of beer, 5 oz of wine, or 1 oz of hard liquor. General instructions  Keep a healthy weight.  Stay active. Try to get at least 30 minutes of activity on all or most days.  Find out if you have a condition called sleep apnea. Get treatment if needed.  Do not use any products that contain nicotine or tobacco, such as cigarettes and e-cigarettes. If you need help quitting, ask your doctor.  Do not abuse drugs.  Keep all follow-up visits as told by your doctor. This is important. Get help right away if:  You have any signs of stroke. "BE FAST" is an easy way to remember the main warning signs: ? B - Balance. Signs are dizziness, sudden trouble walking, or loss of balance. ? E - Eyes. Signs are trouble seeing or a sudden change in how you see. ? F - Face. Signs are sudden weakness or loss of feeling of the face, or the face or eyelid drooping on one side. ? A - Arms. Signs are weakness or loss of feeling in an arm. This happens suddenly and usually on one side of the body. ? S - Speech. Signs are sudden trouble speaking, slurred speech, or trouble understanding what people say. ? T - Time. Time to call emergency services. Write down what time symptoms started.  You have other signs of stroke, such as: ? A sudden, very bad headache with no known cause. ? Feeling sick to your stomach (nausea). ? Throwing up (vomiting). ? Jerky movements that you cannot control (seizure). These symptoms may be an emergency. Do not wait to see if the symptoms will go away. Get medical help right away. Call your local emergency services (911 in the  [place] Do not drive yourself to the hospital. Summary  A transient ischemic attack (TIA) is a "warning stroke" that causes stroke-like symptoms that go away quickly.  A TIA is a medical emergency. Get help right away, even if your symptoms go away.  A TIA does not cause lasting damage to the brain.  Having a TIA is a sign that you may be at risk for a stroke. Lifestyle changes and medical treatments can help prevent a stroke. This information is not intended to replace advice given to you by your health care provider. Make sure you discuss any questions you have with your health care provider. Document Revised: 08/26/2018 Document Reviewed: 03/03/2017 Elsevier Patient Education  [place].

## 2020-10-27 NOTE — ED Notes (Signed)
Patient to 3W08 via stretcher with all belongings. Tech and wife accompanied. VSS. Urine bag to SD. PIV SL. Report to floor RN.

## 2020-10-27 NOTE — Care Management Obs Status (Signed)
[facility] NOTIFICATION   Patient Details  Name: Samuel Moore MRN: [id number] Date of Birth: 1951-09-02   Medicare Observation Status Notification Given:  Yes    Carles Collet, RN 10/27/2020, 3:12 PM

## 2020-10-27 NOTE — Progress Notes (Signed)
PT Cancellation Note  Patient Details Name: Samuel Moore MRN: [id number] DOB: Aug 06, 1951   Cancelled Treatment:    Reason Eval/Treat Not Completed: PT screened, no needs identified, will sign off. Per OT, pt is at baseline and doesn't need skilled PT services or follow up therapy. Please re-consult if needed in future.  Kittie Plater, PT, DPT Acute Rehabilitation Services Pager #: [phone] Office #: [phone]    Berline Lopes 10/27/2020, 1:47 PM

## 2020-12-04 ENCOUNTER — Other Ambulatory Visit: Payer: Self-pay

## 2020-12-04 ENCOUNTER — Encounter: Payer: Self-pay | Admitting: Emergency Medicine

## 2020-12-04 ENCOUNTER — Emergency Department
Admission: EM | Admit: 2020-12-04 | Discharge: 2020-12-04 | Disposition: A | Discharge status: Home / Self Care | Payer: No Typology Code available for payment source | Attending: Emergency Medicine | Admitting: Emergency Medicine

## 2020-12-04 DIAGNOSIS — R112 Nausea with vomiting, unspecified: Secondary | ICD-10-CM | POA: Diagnosis present

## 2020-12-04 DIAGNOSIS — R55 Syncope and collapse: Secondary | ICD-10-CM | POA: Diagnosis not present

## 2020-12-04 DIAGNOSIS — Z5321 Procedure and treatment not carried out due to patient leaving prior to being seen by health care provider: Secondary | ICD-10-CM | POA: Diagnosis not present

## 2020-12-04 DIAGNOSIS — R5383 Other fatigue: Secondary | ICD-10-CM | POA: Insufficient documentation

## 2020-12-04 NOTE — ED Triage Notes (Signed)
Pt comes into the ED via [facility] from [facility].  Pt had syncopal episode while vomiting at the store.  Pt has had N/V/D for 3 days.  Denies any cardiac history and was NSR for EMS.  Tachy at 110.  Pt seen at the [facility] yesterday for the same symptoms yesterday where they gave him IVF and sent him home.  Pt also c/o abdominal pain on the lower central and left side. Pt has even and unlabored respirations at this time but presents lethargic and fatigued. 20 L AC, 4mg  zofran given.

## 2020-12-04 NOTE — ED Notes (Signed)
Pt daughter requesting that IV be removed so she an take him to the [place] where he gets his care.

## 2021-06-06 ENCOUNTER — Emergency Department: Payer: No Typology Code available for payment source

## 2021-06-06 ENCOUNTER — Emergency Department
Admission: EM | Admit: 2021-06-06 | Discharge: 2021-06-06 | Disposition: A | Discharge status: Home / Self Care | Payer: No Typology Code available for payment source | Attending: Emergency Medicine | Admitting: Emergency Medicine

## 2021-06-06 ENCOUNTER — Other Ambulatory Visit: Payer: Self-pay

## 2021-06-06 DIAGNOSIS — R531 Weakness: Secondary | ICD-10-CM | POA: Diagnosis present

## 2021-06-06 DIAGNOSIS — Z87891 Personal history of nicotine dependence: Secondary | ICD-10-CM | POA: Insufficient documentation

## 2021-06-06 DIAGNOSIS — I10 Essential (primary) hypertension: Secondary | ICD-10-CM | POA: Insufficient documentation

## 2021-06-06 DIAGNOSIS — J449 Chronic obstructive pulmonary disease, unspecified: Secondary | ICD-10-CM | POA: Insufficient documentation

## 2021-06-06 DIAGNOSIS — R42 Dizziness and giddiness: Secondary | ICD-10-CM | POA: Insufficient documentation

## 2021-06-06 DIAGNOSIS — Z8551 Personal history of malignant neoplasm of bladder: Secondary | ICD-10-CM | POA: Insufficient documentation

## 2021-06-06 DIAGNOSIS — Z20822 Contact with and (suspected) exposure to covid-19: Secondary | ICD-10-CM | POA: Diagnosis not present

## 2021-06-06 DIAGNOSIS — R079 Chest pain, unspecified: Secondary | ICD-10-CM

## 2021-06-06 DIAGNOSIS — Z7982 Long term (current) use of aspirin: Secondary | ICD-10-CM | POA: Insufficient documentation

## 2021-06-06 DIAGNOSIS — R5383 Other fatigue: Secondary | ICD-10-CM | POA: Diagnosis not present

## 2021-06-06 DIAGNOSIS — R200 Anesthesia of skin: Secondary | ICD-10-CM | POA: Insufficient documentation

## 2021-06-06 DIAGNOSIS — N390 Urinary tract infection, site not specified: Secondary | ICD-10-CM

## 2021-06-06 LAB — CBC
HCT: 32 % — ABNORMAL LOW (ref 39.0–52.0)
Hemoglobin: 9.6 g/dL — ABNORMAL LOW (ref 13.0–17.0)
MCH: 24.7 pg — ABNORMAL LOW (ref 26.0–34.0)
MCHC: 30 g/dL (ref 30.0–36.0)
MCV: 82.3 fL (ref 80.0–100.0)
Platelets: 31 10*3/uL — ABNORMAL LOW (ref 150–400)
RBC: 3.89 MIL/uL — ABNORMAL LOW (ref 4.22–5.81)
RDW: 17.8 % — ABNORMAL HIGH (ref 11.5–15.5)
WBC: 3 10*3/uL — ABNORMAL LOW (ref 4.0–10.5)
nRBC: 0 % (ref 0.0–0.2)

## 2021-06-06 LAB — URINALYSIS, COMPLETE (UACMP) WITH MICROSCOPIC
Bilirubin Urine: NEGATIVE
Glucose, UA: NEGATIVE mg/dL
Hgb urine dipstick: NEGATIVE
Ketones, ur: NEGATIVE mg/dL
Leukocytes,Ua: NEGATIVE
Nitrite: POSITIVE — AB
Protein, ur: 30 mg/dL — AB
Specific Gravity, Urine: 1.011 (ref 1.005–1.030)
pH: 9 — ABNORMAL HIGH (ref 5.0–8.0)

## 2021-06-06 LAB — COMPREHENSIVE METABOLIC PANEL
ALT: 10 U/L (ref 0–44)
AST: 14 U/L — ABNORMAL LOW (ref 15–41)
Albumin: 3.6 g/dL (ref 3.5–5.0)
Alkaline Phosphatase: 67 U/L (ref 38–126)
Anion gap: 5 (ref 5–15)
BUN: 20 mg/dL (ref 8–23)
CO2: 28 mmol/L (ref 22–32)
Calcium: 8.9 mg/dL (ref 8.9–10.3)
Chloride: 105 mmol/L (ref 98–111)
Creatinine, Ser: 1.04 mg/dL (ref 0.61–1.24)
GFR, Estimated: >60 mL/min (ref >60)
Glucose, Bld: 94 mg/dL (ref 70–99)
Potassium: 4.1 mmol/L (ref 3.5–5.1)
Sodium: 138 mmol/L (ref 135–145)
Total Bilirubin: 0.7 mg/dL (ref 0.3–1.2)
Total Protein: 6.8 g/dL (ref 6.5–8.1)

## 2021-06-06 LAB — RESP PANEL BY RT-PCR (FLU A&B, COVID) ARPGX2
Influenza A by PCR: NEGATIVE
Influenza B by PCR: NEGATIVE
SARS Coronavirus 2 by RT PCR: NEGATIVE

## 2021-06-06 LAB — TROPONIN I (HIGH SENSITIVITY)
Troponin I (High Sensitivity): 5 ng/L (ref <18)
Troponin I (High Sensitivity): 5 ng/L (ref <18)

## 2021-06-06 IMAGING — DX DG CHEST 1V
1 series · 1 of 1 positions shown · non-contrast
Comparison: Radiograph [DATE].  CT [DATE]

CLINICAL DATA: Dizziness.  Weakness.

EXAM:
CHEST  1 VIEW

[chest ap]
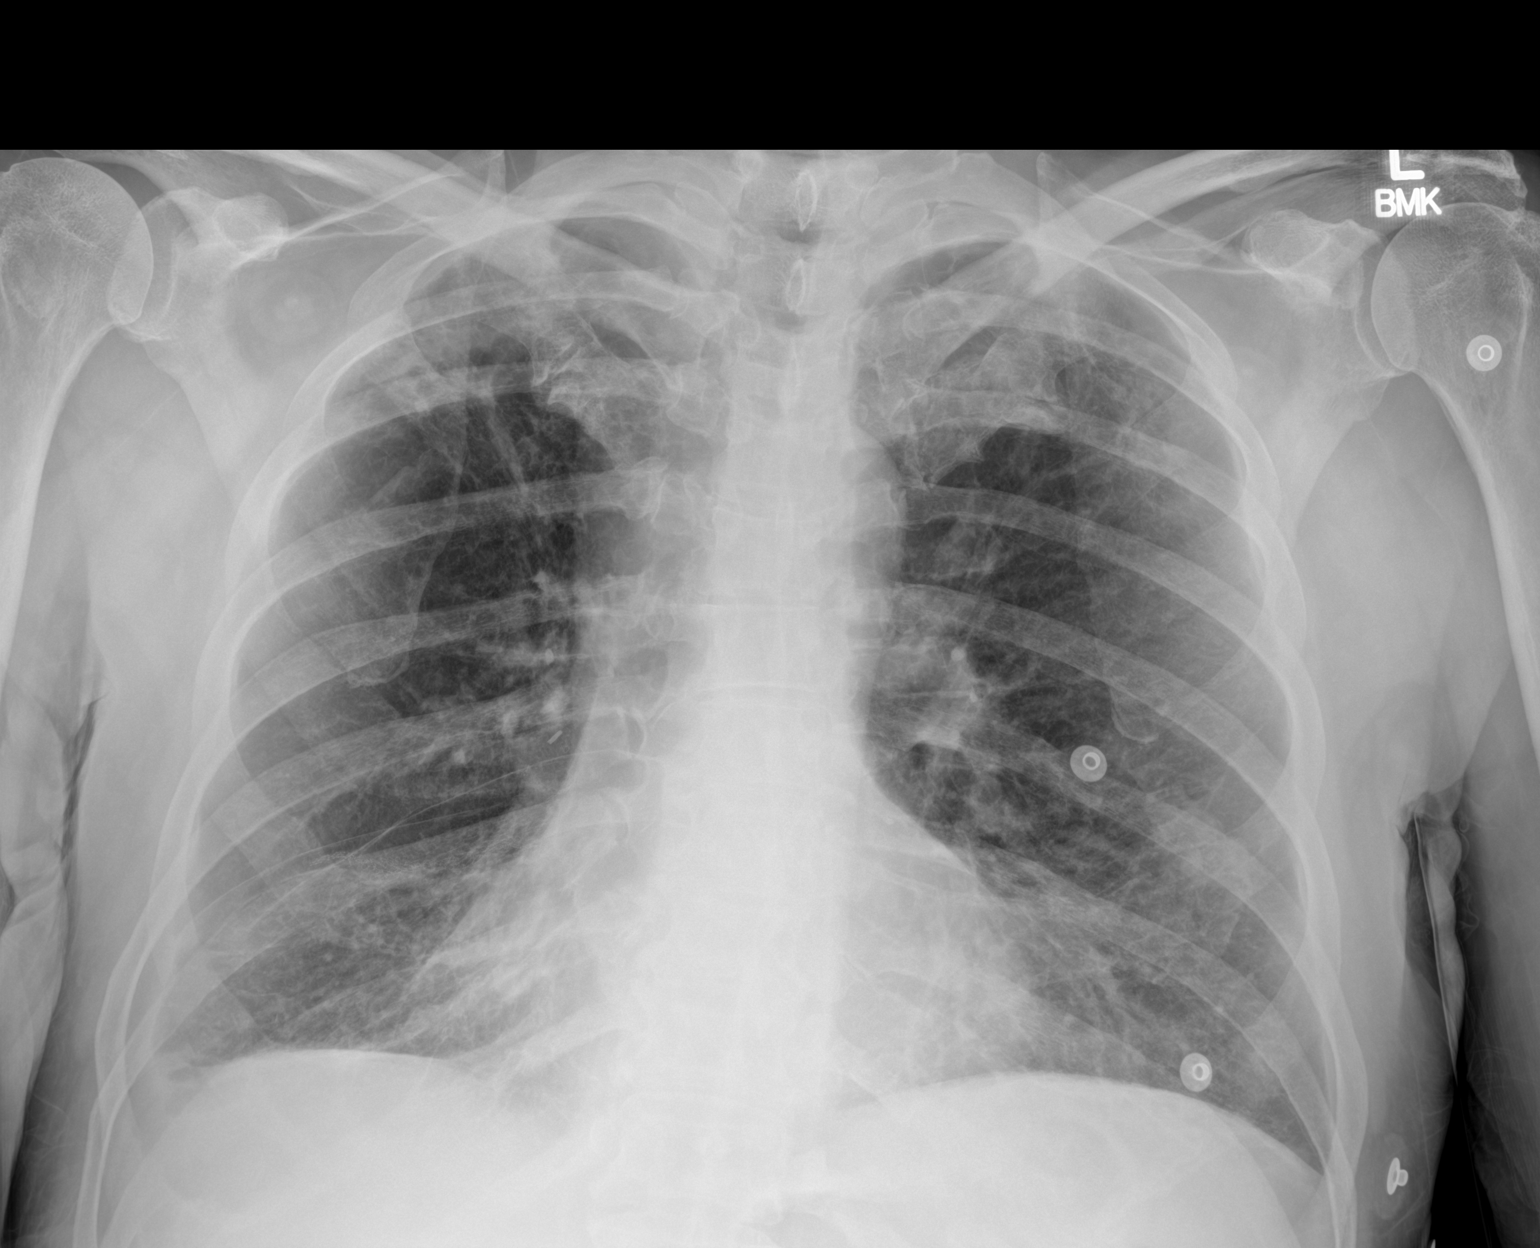

[1 of 1 positions shown; findings below may reference images not displayed]

FINDINGS: Lower lung volumes from prior exam. Chronic hyperinflation. There is
biapical pleuroparenchymal opacity, right greater than left,
typically scarring. Scarring in the left lower lung zone, lingula on
prior CT. Patchy bibasilar opacities, new. Chronic blunting of the
right costophrenic angle. Stable heart size and mediastinal
contours. No pneumothorax. No acute osseous abnormalities are seen.
IMPRESSION: 1. Patchy bibasilar opacities, new from prior exam, may be
atelectasis or infection.
2. Chronic hyperinflation and multifocal lung scarring.

## 2021-06-06 IMAGING — CT CT HEAD W/O CM
3 series · 15 of 47 positions shown, 18 images · non-contrast
Comparison: [DATE]

CLINICAL DATA: Dizziness.

EXAM:
CT HEAD WITHOUT CONTRAST
TECHNIQUE: Contiguous axial images were obtained from the base of the skull
through the vertex without intravenous contrast.

[Series 2: head wo · axial · 0.48mm/px · z∈[-92,+48]mm · 9 of 34 slices shown, 12 images]
[im 3/34  brain]
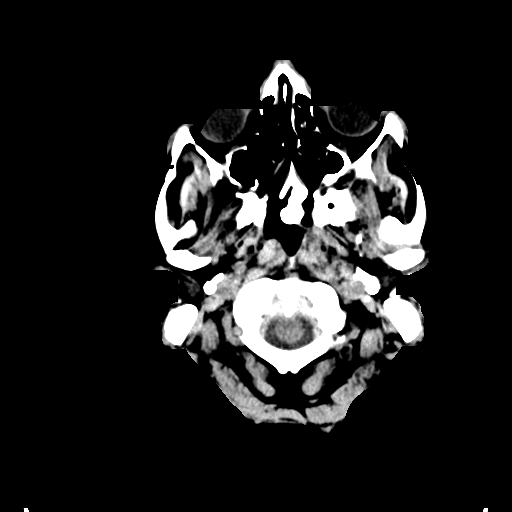
[im 3/34  bone]
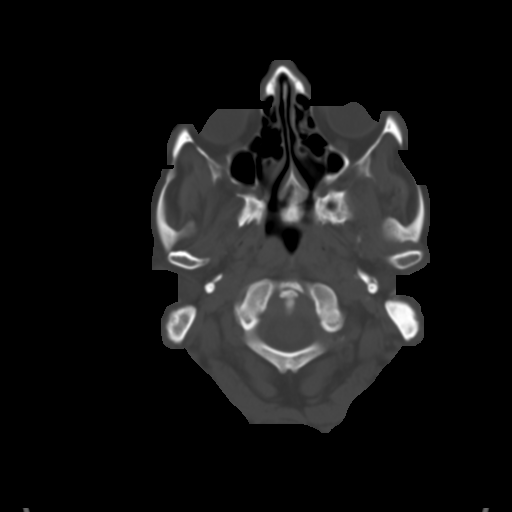
[im 6/34  brain]
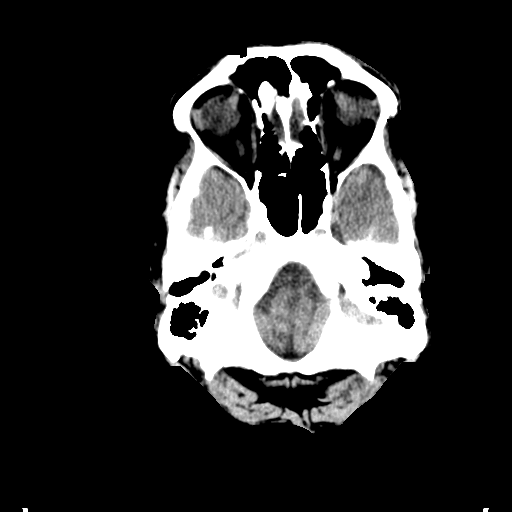
[im 10/34  brain]
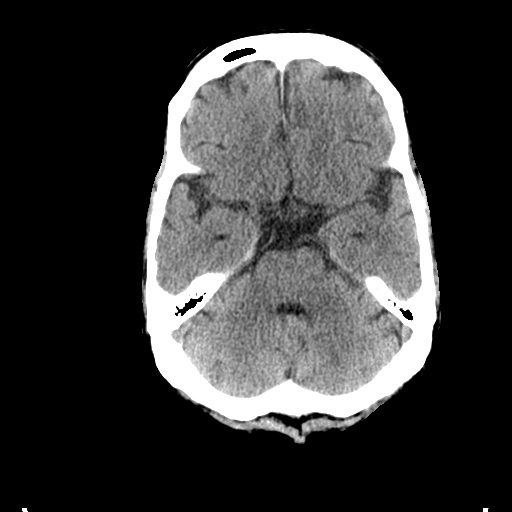
[im 13/34  brain]
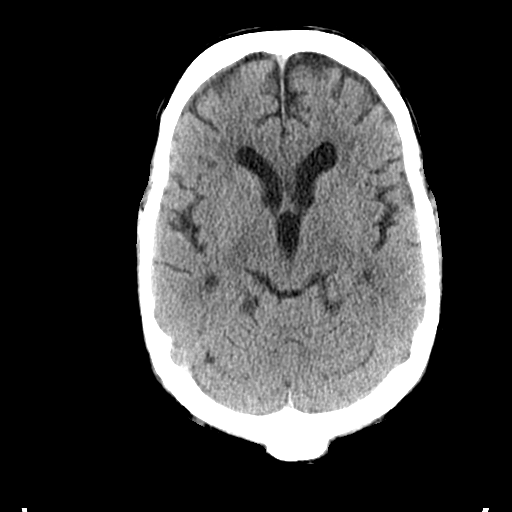
[im 18/34  brain]
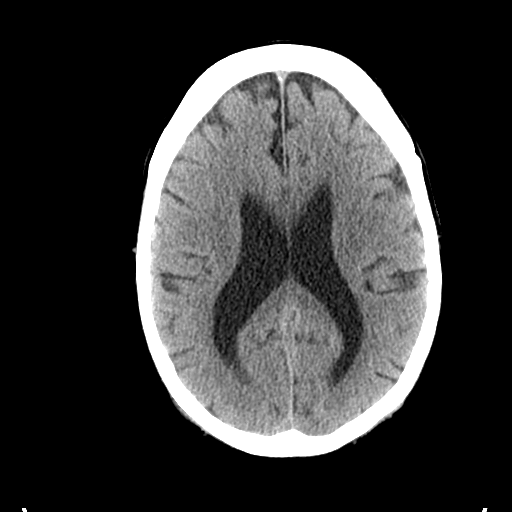
[im 18/34  bone]
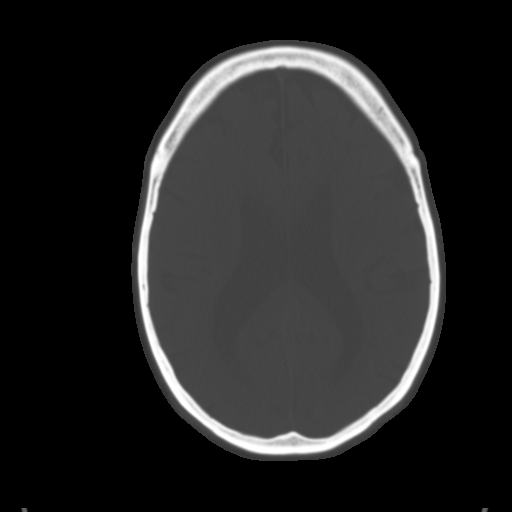
[im 21/34  brain]
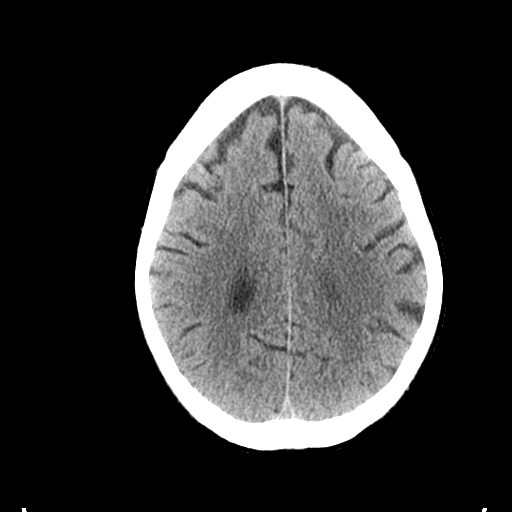
[im 24/34  brain]
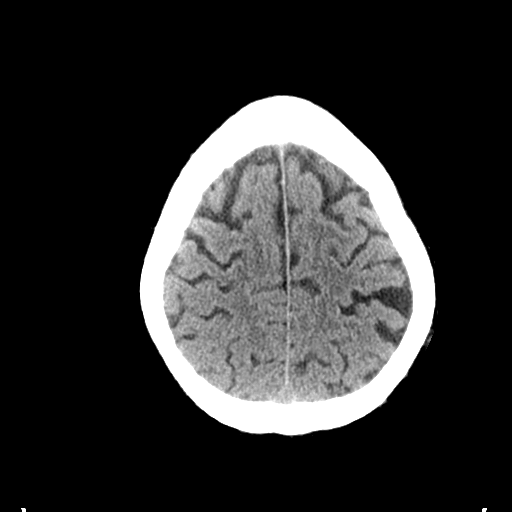
[im 28/34  brain]
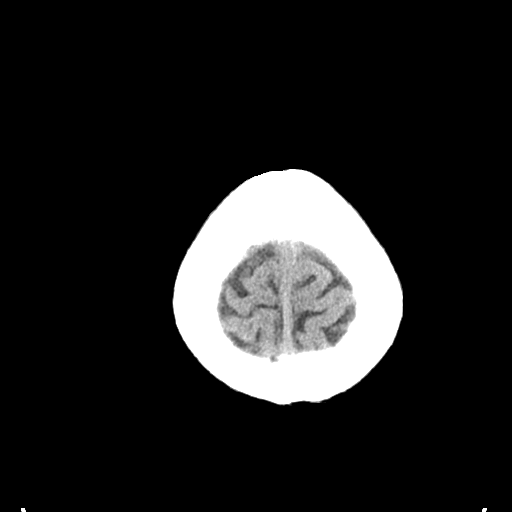
[im 31/34  brain]
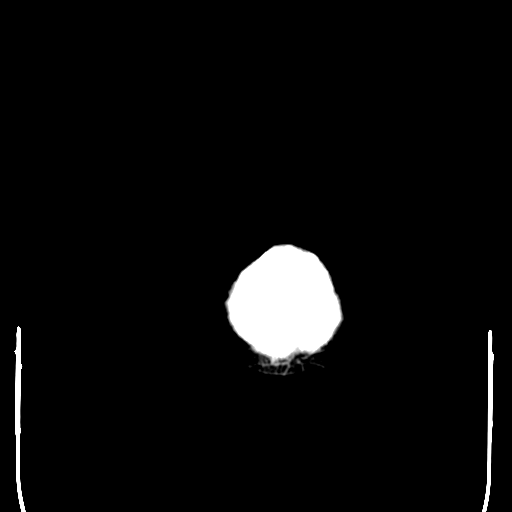
[im 31/34  bone]
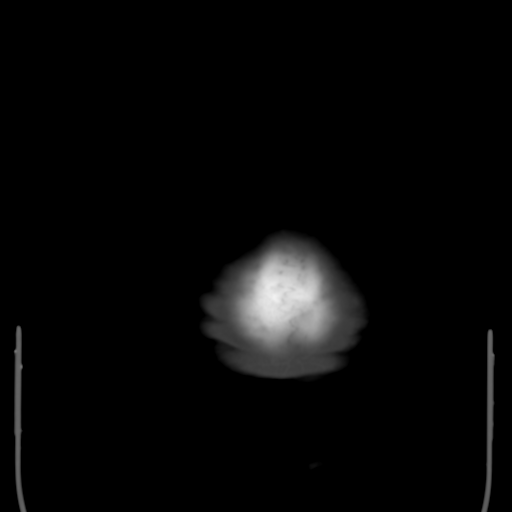

[Series 4: coronal soft tissue · coronal · 0.36mm/px · 3 of 83 slices shown]
[im 28/83  brain]
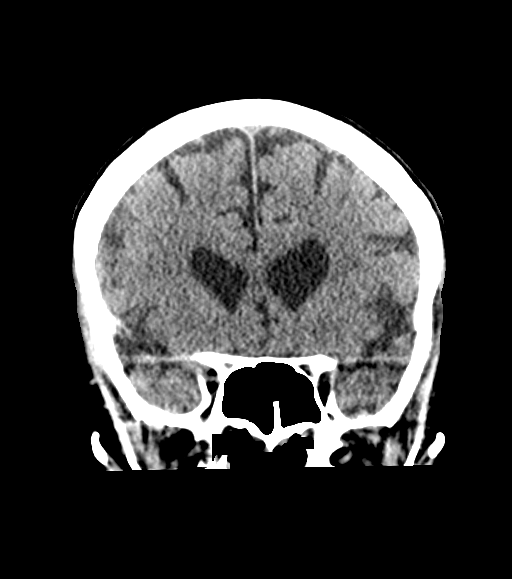
[im 37/83  brain]
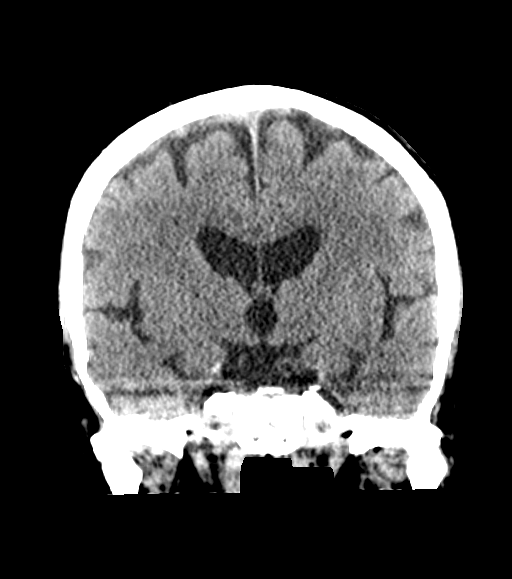
[im 46/83  brain]
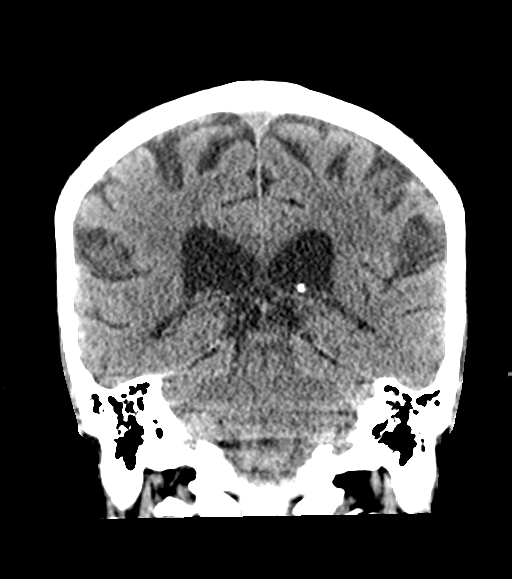

[Series 5: sagittal soft tissue · sagittal · 0.35mm/px · 3 of 61 slices shown]
[im 21/61  brain]
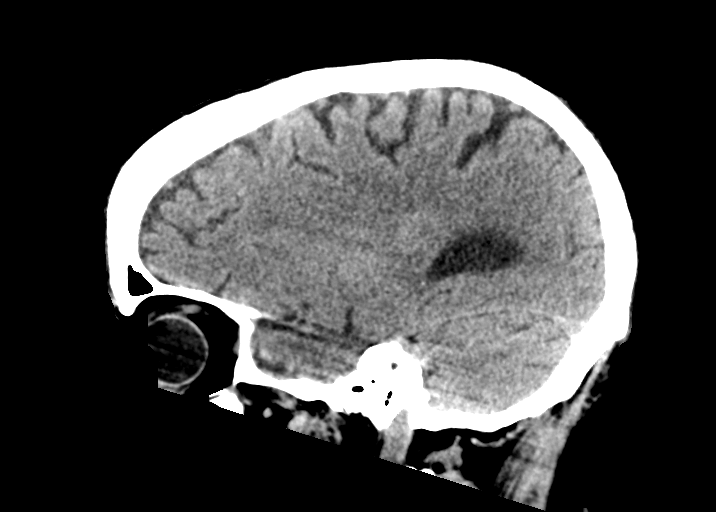
[im 31/61  brain]
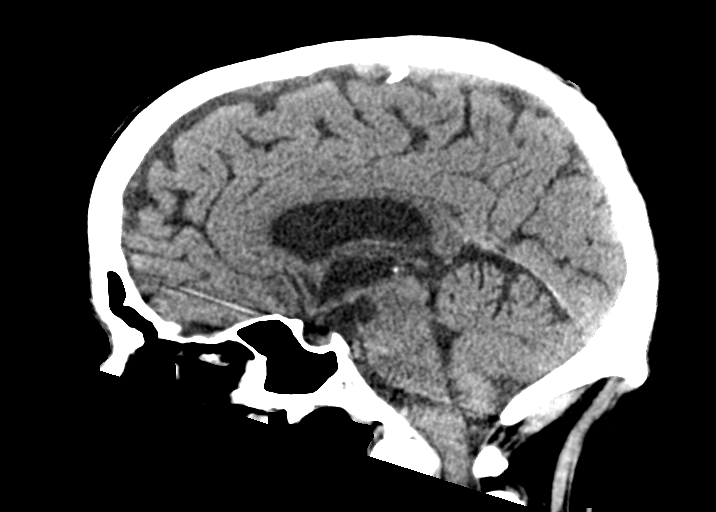
[im 41/61  brain]
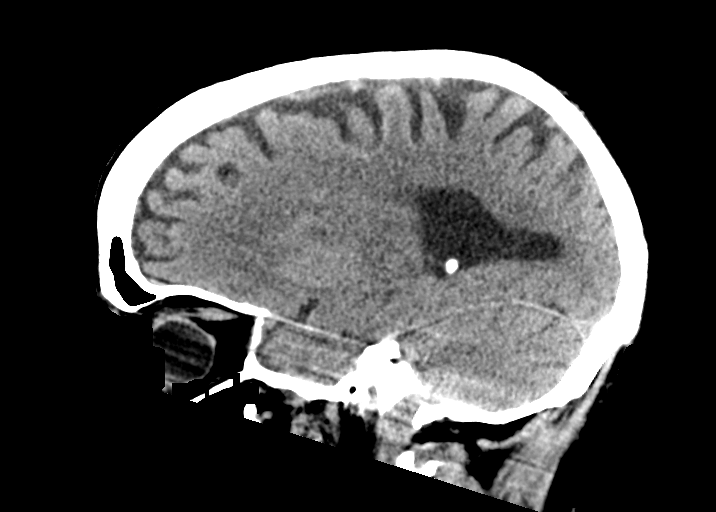

[15 of 47 positions shown; findings below may reference images not displayed]

FINDINGS: Brain: There is mild cerebral atrophy with widening of the
extra-axial spaces and ventricular dilatation.
There are areas of decreased attenuation within the white matter
tracts of the supratentorial brain, consistent with microvascular
disease changes.

A small chronic right cerebellar infarct is again seen.

Vascular: No hyperdense vessel or unexpected calcification.

Skull: Normal. Negative for fracture or focal lesion.

Sinuses/Orbits: No acute finding.

Other: None.
IMPRESSION: 1. Generalized cerebral atrophy.
2. No acute intracranial abnormality.

## 2021-06-06 MED ORDER — CEPHALEXIN 500 MG PO CAPS: 500.0000 mg | ORAL_CAPSULE | Freq: Three times a day (TID) | ORAL | 0 refills | Status: DC

## 2021-06-06 MED ORDER — CEPHALEXIN 500 MG PO CAPS
500.0000 mg | ORAL_CAPSULE | Freq: Once | ORAL | Status: AC
  Administered 2021-06-06: 500 mg via ORAL
  Filled 2021-06-06: qty 1

## 2021-06-06 MED ORDER — AZITHROMYCIN 500 MG PO TABS
500.0000 mg | ORAL_TABLET | Freq: Once | ORAL | Status: AC
  Administered 2021-06-06: 500 mg via ORAL
  Filled 2021-06-06: qty 1

## 2021-06-06 MED ORDER — SODIUM CHLORIDE 0.9 % IV BOLUS
1000.0000 mL | Freq: Once | INTRAVENOUS | Status: AC
  Administered 2021-06-06: 1000 mL via INTRAVENOUS

## 2021-06-06 MED ORDER — AZITHROMYCIN 250 MG PO TABS: 250.0000 mg | ORAL_TABLET | Freq: Every day | ORAL | 0 refills | Status: DC

## 2021-06-06 NOTE — ED Notes (Addendum)
Pt requested warm blankets using call bell. This RN brought two to bedside but pt now off unit for imaging. Visitor at bedside collected blankets for pt.

## 2021-06-06 NOTE — ED Notes (Signed)
Pt would like it noted that his urine sample "may show bacteria d/t my ostomy and leg bags". Visible clouds of white substance floating around in urine bag. Sample sent to lab.

## 2021-06-06 NOTE — ED Triage Notes (Addendum)
Pt states is dizzy, has been dizzy off and on for weeks, has been having weakness. Pt states began to experience left sided chest pain tonight. Pt is a poor historian. Pt denies shortness of breath. Pt states was recently diagnosed with "Blood cancer", states his platelets are low.

## 2021-06-06 NOTE — ED Provider Notes (Signed)
[facility] Emergency Department Provider Note  Time seen: 9:13 PM  I have reviewed the triage vital signs and the nursing notes.   HISTORY  Chief Complaint Dizziness and Weakness   HPI Samuel Moore is a 70 y.o. male with a past medical history of bladder cancer, COPD, hypertension, past CVA/TIA with no significant deficits who presents emergency department for cute onset of weakness.  According to the wife around 5 PM today they were out shopping when the patient began feeling very weak and lightheaded.  States he was complaining of numbness and tingling throughout his body on both sides.  Patient seemed very fatigued.  Wife states patient is currently being treated for "blood cancer" and was recently told that his lab work showed very low platelet count.  Patient is being followed up at the [facility] hospital.  Patient denies any chest pain at any point.  Denies any focal weakness or numbness.  Past Medical History:  Diagnosis Date   Cancer [facility])    COPD (chronic obstructive pulmonary disease) ([place])    Hypertension     Patient Active Problem List   Diagnosis Date Noted   TIA (transient ischemic attack) 10/26/2020    Past Surgical History:  Procedure Laterality Date   BLADDER REMOVAL     LOBECTOMY     prostate     removed    Prior to Admission medications   Medication Sig Start Date End Date Taking? Authorizing Provider  albuterol (VENTOLIN HFA) 108 (90 Base) MCG/ACT inhaler Inhale 2 puffs into the lungs 4 (four) times daily as needed for wheezing or shortness of breath.  04/17/20   [provider]  aspirin 81 MG chewable tablet Chew 81 mg by mouth in the morning.    [provider]  atorvastatin (LIPITOR) 80 MG tablet Take 40 mg by mouth daily. 05/30/19   [provider]  Carboxymethylcellulose Sod PF 0.5 % SOLN Place 1 drop into both eyes 4 (four) times daily.    [provider]  cyanocobalamin 1000 MCG tablet Take 1,000  mcg by mouth daily.    [provider]  pantoprazole (PROTONIX) 40 MG tablet Take 40 mg by mouth daily before breakfast.    [provider]  Tiotropium Bromide Monohydrate (SPIRIVA RESPIMAT) 2.5 MCG/ACT AERS Inhale 1 puff into the lungs in the morning and at bedtime.    [provider]  traZODone (DESYREL) 50 MG tablet Take 50 mg by mouth at bedtime as needed for sleep.    [provider]  Grant Ruts INHUB 250-50 MCG/DOSE AEPB Inhale 1 puff into the lungs 2 (two) times daily.    [provider]    Allergies  Allergen Reactions   Lisinopril-Hydrochlorothiazide Other (See Comments)    Renal failure syndrome    No family history on file.  Social History Social History   Tobacco Use   Smoking status: Former    Packs/day: 2.00    Years: 10.00    Pack years: 20.00    Types: Cigarettes    Quit date: 12/14/2002    Years since quitting: 18.4   Smokeless tobacco: Never  Substance Use Topics   Alcohol use: No   Drug use: No    Review of Systems Constitutional: Negative for fever.  Positive for generalized fatigue and weakness today. Cardiovascular: Negative for chest pain. Respiratory: Negative for shortness of breath. Gastrointestinal: Negative for abdominal pain, vomiting and diarrhea. Genitourinary: Negative for urinary compaints.  Urostomy. Musculoskeletal: Generalized weakness. Neurological:  Negative for headache.  Denies focal deficits. All other ROS negative  ____________________________________________   PHYSICAL EXAM:  VITAL SIGNS: ED Triage Vitals  Enc Vitals Group     BP 06/06/21 2032 139/71     Pulse Rate 06/06/21 2027 77     Resp 06/06/21 2027 (!) 22     Temp 06/06/21 2032 98.6 F (37 C)     Temp Source 06/06/21 2027 Oral     SpO2 06/06/21 2032 99 %     Weight 06/06/21 2025 180 lb (81.6 kg)     Height 06/06/21 2025 6' (1.829 m)     Head Circumference --      Peak Flow --      Pain Score 06/06/21 2025 5     Pain Loc  --      Pain Edu? --      Excl. in [place]? --    Constitutional: Alert and oriented. Well appearing and in no distress.  Does appear somewhat fatigued.  But able to follow commands and answer questions appropriately. Eyes: Normal exam ENT      Head: Normocephalic and atraumatic.      Mouth/Throat: Mucous membranes are moist. Cardiovascular: Normal rate, regular rhythm.  Respiratory: Normal respiratory effort without tachypnea nor retractions. Breath sounds are clear  Gastrointestinal: Soft and nontender. No distention. Musculoskeletal: Nontender with normal range of motion in all extremities.  Neurologic:  Normal speech and language. No gross focal neurologic deficits.  Equal grip strength.  No pronator drift.  4+/5 strength in all extremities. Skin:  Skin is warm, dry and intact.  Psychiatric: Mood and affect are normal.   ____________________________________________    EKG  EKG viewed and interpreted by myself shows a normal sinus rhythm at 77 bpm with a narrow QRS, normal axis, normal intervals, no concerning ST changes.  ____________________________________________    RADIOLOGY  Chest x-ray shows patchy bibasilar opacities infection versus atelectasis.  ____________________________________________   INITIAL IMPRESSION / ASSESSMENT AND PLAN / ED COURSE  Pertinent labs & imaging results that were available during my care of the patient were reviewed by me and considered in my medical decision making (see chart for details).   Patient presents to the emergency department for cute onset of generalized fatigue and weakness earlier this evening around 4 5 PM.  Currently patient states he continues to feel fatigued but is feeling a little better.  Denies any weakness or numbness although states at the time he was experiencing generalized weakness and tingling throughout his body.  Denies any chest pain cough shortness of breath fever nausea vomiting or diarrhea.  Patient has a urostomy  from past bladder cancer.  Wife states a history of a CVA or TIA in the past but denies any long-term deficits.  Overall the patient appears well.  We will check labs, obtain a CT scan of the head as a precaution.  We will check a urinalysis, COVID swab and continue to closely monitor.  We will IV hydrate while awaiting results.  Patient's work-up shows a urinary tract infection however given his ostomy this could be chronic colonization.  Urine culture is in process.  However given the patient's symptoms of weakness today with a urine findings we will treat with antibiotics.  Patient's chest x-ray also shows bibasilar opacities infection versus atelectasis.  Again given the patient's weakness we will cover the combination of Keflex and Zithromax to cover both pulmonary as well as urinary pathogens.  The remainder of the work-up is reassuring  including a negative troponin x2.  Negative COVID.  Patient does have thrombocytopenia but this is known to the patient and he follows up with the [facility] for this.  Patient's blood pressure is well-appearing 140/73 remainder the vitals are normal as well.  Patient states he is feeling much better after fluids.  Discussed options with the patient he is agreeable to discharge home with a trial of antibiotics.  If symptoms return or worsen he is to return to the emergency department otherwise he will follow-up with his doctor on Monday.  Samuel Moore was evaluated in Emergency Department on 06/06/2021 for the symptoms described in the history of present illness. He was evaluated in the context of the global COVID-19 pandemic, which necessitated consideration that the patient might be at risk for infection with the SARS-CoV-2 virus that causes COVID-19. Institutional protocols and algorithms that pertain to the evaluation of patients at risk for COVID-19 are in a state of rapid change based on information released by regulatory bodies including the CDC and federal and state  organizations. These policies and algorithms were followed during the patient's care in the ED.  ____________________________________________   FINAL CLINICAL IMPRESSION(S) / ED DIAGNOSES  Weakness   Harvest Dark, MD 06/06/21 2315

## 2021-06-06 NOTE — ED Notes (Signed)
See triage note. Pt has urostomy. Pt laying calmly in bed; skin dry; resp reg/unlabored; L chest pain under L pec; pt reports feels like pressure; c/o SOB when outside being active. Denies fever and N/V/D. A&O4.

## 2021-06-06 NOTE — Discharge Instructions (Addendum)
Please take your antibiotics as prescribed for their entire course.  Please follow-up with your doctor at the [place] on Monday for recheck/reevaluation.  Please return to the emergency department for any further episodes of weakness, if you develop a fever, chest pain, or any other symptom personally concerning to yourself.

## 2021-06-09 LAB — URINE CULTURE: Culture: >=100000 — AB

## 2021-06-10 NOTE — Consult Note (Signed)
Case discussed with Dr. Jari Pigg. Pt growing providencia, and was sent home on keflex. Keflex does not cover the pathogen. Plan to call in prescription for aungmentin 875 mg BID x 7 days. Fax prescription to [facility]. [phone], as they do not take call in prescriptions. Fax successful.    Eleonore Chiquito, PharmD, BCPS

## 2021-06-12 DIAGNOSIS — R404 Transient alteration of awareness: Secondary | ICD-10-CM | POA: Diagnosis present

## 2021-06-12 DIAGNOSIS — Z936 Other artificial openings of urinary tract status: Secondary | ICD-10-CM

## 2021-06-12 DIAGNOSIS — D469 Myelodysplastic syndrome, unspecified: Secondary | ICD-10-CM | POA: Diagnosis present

## 2021-06-12 DIAGNOSIS — D696 Thrombocytopenia, unspecified: Secondary | ICD-10-CM | POA: Diagnosis present

## 2021-10-09 ENCOUNTER — Emergency Department
Admission: EM | Admit: 2021-10-09 | Discharge: 2021-10-09 | Disposition: A | Discharge status: Home / Self Care | Payer: No Typology Code available for payment source | Attending: Emergency Medicine | Admitting: Emergency Medicine

## 2021-10-09 ENCOUNTER — Other Ambulatory Visit: Payer: Self-pay

## 2021-10-09 ENCOUNTER — Emergency Department: Payer: No Typology Code available for payment source

## 2021-10-09 DIAGNOSIS — R079 Chest pain, unspecified: Secondary | ICD-10-CM | POA: Insufficient documentation

## 2021-10-09 DIAGNOSIS — Z5321 Procedure and treatment not carried out due to patient leaving prior to being seen by health care provider: Secondary | ICD-10-CM | POA: Diagnosis not present

## 2021-10-09 DIAGNOSIS — R531 Weakness: Secondary | ICD-10-CM | POA: Insufficient documentation

## 2021-10-09 LAB — TROPONIN I (HIGH SENSITIVITY): Troponin I (High Sensitivity): 2 ng/L (ref <18)

## 2021-10-09 LAB — COMPREHENSIVE METABOLIC PANEL
ALT: 10 U/L (ref 0–44)
AST: 13 U/L — ABNORMAL LOW (ref 15–41)
Albumin: 3.8 g/dL (ref 3.5–5.0)
Alkaline Phosphatase: 69 U/L (ref 38–126)
Anion gap: 5 (ref 5–15)
BUN: 17 mg/dL (ref 8–23)
CO2: 25 mmol/L (ref 22–32)
Calcium: 8.8 mg/dL — ABNORMAL LOW (ref 8.9–10.3)
Chloride: 107 mmol/L (ref 98–111)
Creatinine, Ser: 1.1 mg/dL (ref 0.61–1.24)
GFR, Estimated: >60 mL/min (ref >60)
Glucose, Bld: 120 mg/dL — ABNORMAL HIGH (ref 70–99)
Potassium: 4 mmol/L (ref 3.5–5.1)
Sodium: 137 mmol/L (ref 135–145)
Total Bilirubin: 0.6 mg/dL (ref 0.3–1.2)
Total Protein: 7.1 g/dL (ref 6.5–8.1)

## 2021-10-09 LAB — CBC
HCT: 31.8 % — ABNORMAL LOW (ref 39.0–52.0)
Hemoglobin: 9.9 g/dL — ABNORMAL LOW (ref 13.0–17.0)
MCH: 26.4 pg (ref 26.0–34.0)
MCHC: 31.1 g/dL (ref 30.0–36.0)
MCV: 84.8 fL (ref 80.0–100.0)
Platelets: 39 K/uL — ABNORMAL LOW (ref 150–400)
RBC: 3.75 MIL/uL — ABNORMAL LOW (ref 4.22–5.81)
RDW: 17.9 % — ABNORMAL HIGH (ref 11.5–15.5)
WBC: 3.1 K/uL — ABNORMAL LOW (ref 4.0–10.5)
nRBC: 0 % (ref 0.0–0.2)

## 2021-10-09 NOTE — ED Triage Notes (Addendum)
Pt brought in from home by [facility] for onset of chest pain x 1 hr. Pt has cardiac history but unsure of diagnosis. Denies hx of MI, pt states has had generalized weakness all over. EMS gave 324 mg asa and nitro x 1. Pain went from 2/10 to painfree after nitro.

## 2022-05-05 ENCOUNTER — Ambulatory Visit: Payer: No Typology Code available for payment source

## 2022-05-19 ENCOUNTER — Emergency Department: Payer: No Typology Code available for payment source

## 2022-05-19 ENCOUNTER — Observation Stay
Admission: EM | Admit: 2022-05-19 | Discharge: 2022-05-20 | Disposition: A | Discharge status: Home / Self Care | Payer: No Typology Code available for payment source | Attending: Internal Medicine | Admitting: Internal Medicine

## 2022-05-19 ENCOUNTER — Encounter: Payer: Self-pay | Admitting: Emergency Medicine

## 2022-05-19 DIAGNOSIS — J449 Chronic obstructive pulmonary disease, unspecified: Secondary | ICD-10-CM

## 2022-05-19 DIAGNOSIS — Z8673 Personal history of transient ischemic attack (TIA), and cerebral infarction without residual deficits: Secondary | ICD-10-CM | POA: Insufficient documentation

## 2022-05-19 DIAGNOSIS — R569 Unspecified convulsions: Secondary | ICD-10-CM | POA: Diagnosis present

## 2022-05-19 DIAGNOSIS — I10 Essential (primary) hypertension: Secondary | ICD-10-CM

## 2022-05-19 DIAGNOSIS — R4182 Altered mental status, unspecified: Secondary | ICD-10-CM | POA: Insufficient documentation

## 2022-05-19 DIAGNOSIS — Z8551 Personal history of malignant neoplasm of bladder: Secondary | ICD-10-CM | POA: Insufficient documentation

## 2022-05-19 DIAGNOSIS — D469 Myelodysplastic syndrome, unspecified: Secondary | ICD-10-CM | POA: Diagnosis present

## 2022-05-19 DIAGNOSIS — Z87891 Personal history of nicotine dependence: Secondary | ICD-10-CM | POA: Diagnosis not present

## 2022-05-19 DIAGNOSIS — Z85118 Personal history of other malignant neoplasm of bronchus and lung: Secondary | ICD-10-CM | POA: Insufficient documentation

## 2022-05-19 DIAGNOSIS — D696 Thrombocytopenia, unspecified: Secondary | ICD-10-CM | POA: Diagnosis present

## 2022-05-19 DIAGNOSIS — R404 Transient alteration of awareness: Secondary | ICD-10-CM | POA: Diagnosis present

## 2022-05-19 DIAGNOSIS — Z936 Other artificial openings of urinary tract status: Secondary | ICD-10-CM

## 2022-05-19 DIAGNOSIS — Z79899 Other long term (current) drug therapy: Secondary | ICD-10-CM | POA: Insufficient documentation

## 2022-05-19 DIAGNOSIS — Z7982 Long term (current) use of aspirin: Secondary | ICD-10-CM | POA: Diagnosis not present

## 2022-05-19 DIAGNOSIS — G40909 Epilepsy, unspecified, not intractable, without status epilepticus: Secondary | ICD-10-CM

## 2022-05-19 LAB — CBC WITH DIFFERENTIAL/PLATELET
Abs Immature Granulocytes: 0.01 10*3/uL (ref 0.00–0.07)
Basophils Absolute: 0 10*3/uL (ref 0.0–0.1)
Basophils Relative: 0 %
Eosinophils Absolute: 0 10*3/uL (ref 0.0–0.5)
Eosinophils Relative: 1 %
HCT: 31 % — ABNORMAL LOW (ref 39.0–52.0)
Hemoglobin: 9.3 g/dL — ABNORMAL LOW (ref 13.0–17.0)
Immature Granulocytes: 0 %
Lymphocytes Relative: 28 %
Lymphs Abs: 0.7 10*3/uL (ref 0.7–4.0)
MCH: 25.1 pg — ABNORMAL LOW (ref 26.0–34.0)
MCHC: 30 g/dL (ref 30.0–36.0)
MCV: 83.6 fL (ref 80.0–100.0)
Monocytes Absolute: 0.3 10*3/uL (ref 0.1–1.0)
Monocytes Relative: 11 %
Neutro Abs: 1.6 10*3/uL — ABNORMAL LOW (ref 1.7–7.7)
Neutrophils Relative %: 60 %
Platelets: 24 10*3/uL — CL (ref 150–400)
RBC: 3.71 MIL/uL — ABNORMAL LOW (ref 4.22–5.81)
RDW: 17.9 % — ABNORMAL HIGH (ref 11.5–15.5)
Smear Review: DECREASED
WBC: 2.6 10*3/uL — ABNORMAL LOW (ref 4.0–10.5)
nRBC: 0 % (ref 0.0–0.2)

## 2022-05-19 LAB — COMPREHENSIVE METABOLIC PANEL
ALT: 10 U/L (ref 0–44)
AST: 15 U/L (ref 15–41)
Albumin: 3.5 g/dL (ref 3.5–5.0)
Alkaline Phosphatase: 67 U/L (ref 38–126)
Anion gap: 4 — ABNORMAL LOW (ref 5–15)
BUN: 23 mg/dL (ref 8–23)
CO2: 22 mmol/L (ref 22–32)
Calcium: 8.4 mg/dL — ABNORMAL LOW (ref 8.9–10.3)
Chloride: 110 mmol/L (ref 98–111)
Creatinine, Ser: 1.15 mg/dL (ref 0.61–1.24)
GFR, Estimated: >60 mL/min (ref >60)
Glucose, Bld: 106 mg/dL — ABNORMAL HIGH (ref 70–99)
Potassium: 3.8 mmol/L (ref 3.5–5.1)
Sodium: 136 mmol/L (ref 135–145)
Total Bilirubin: 0.5 mg/dL (ref 0.3–1.2)
Total Protein: 6.6 g/dL (ref 6.5–8.1)

## 2022-05-19 LAB — TROPONIN I (HIGH SENSITIVITY): Troponin I (High Sensitivity): 37 ng/L — ABNORMAL HIGH (ref <18)

## 2022-05-19 LAB — LACTIC ACID, PLASMA: Lactic Acid, Venous: 1 mmol/L (ref 0.5–1.9)

## 2022-05-19 IMAGING — DX DG CHEST 1V PORT
1 series · 1 of 1 positions shown · non-contrast
Comparison: [DATE], CT [DATE]

CLINICAL DATA: Shortness of breath

EXAM:
PORTABLE CHEST 1 VIEW

[chest ap]
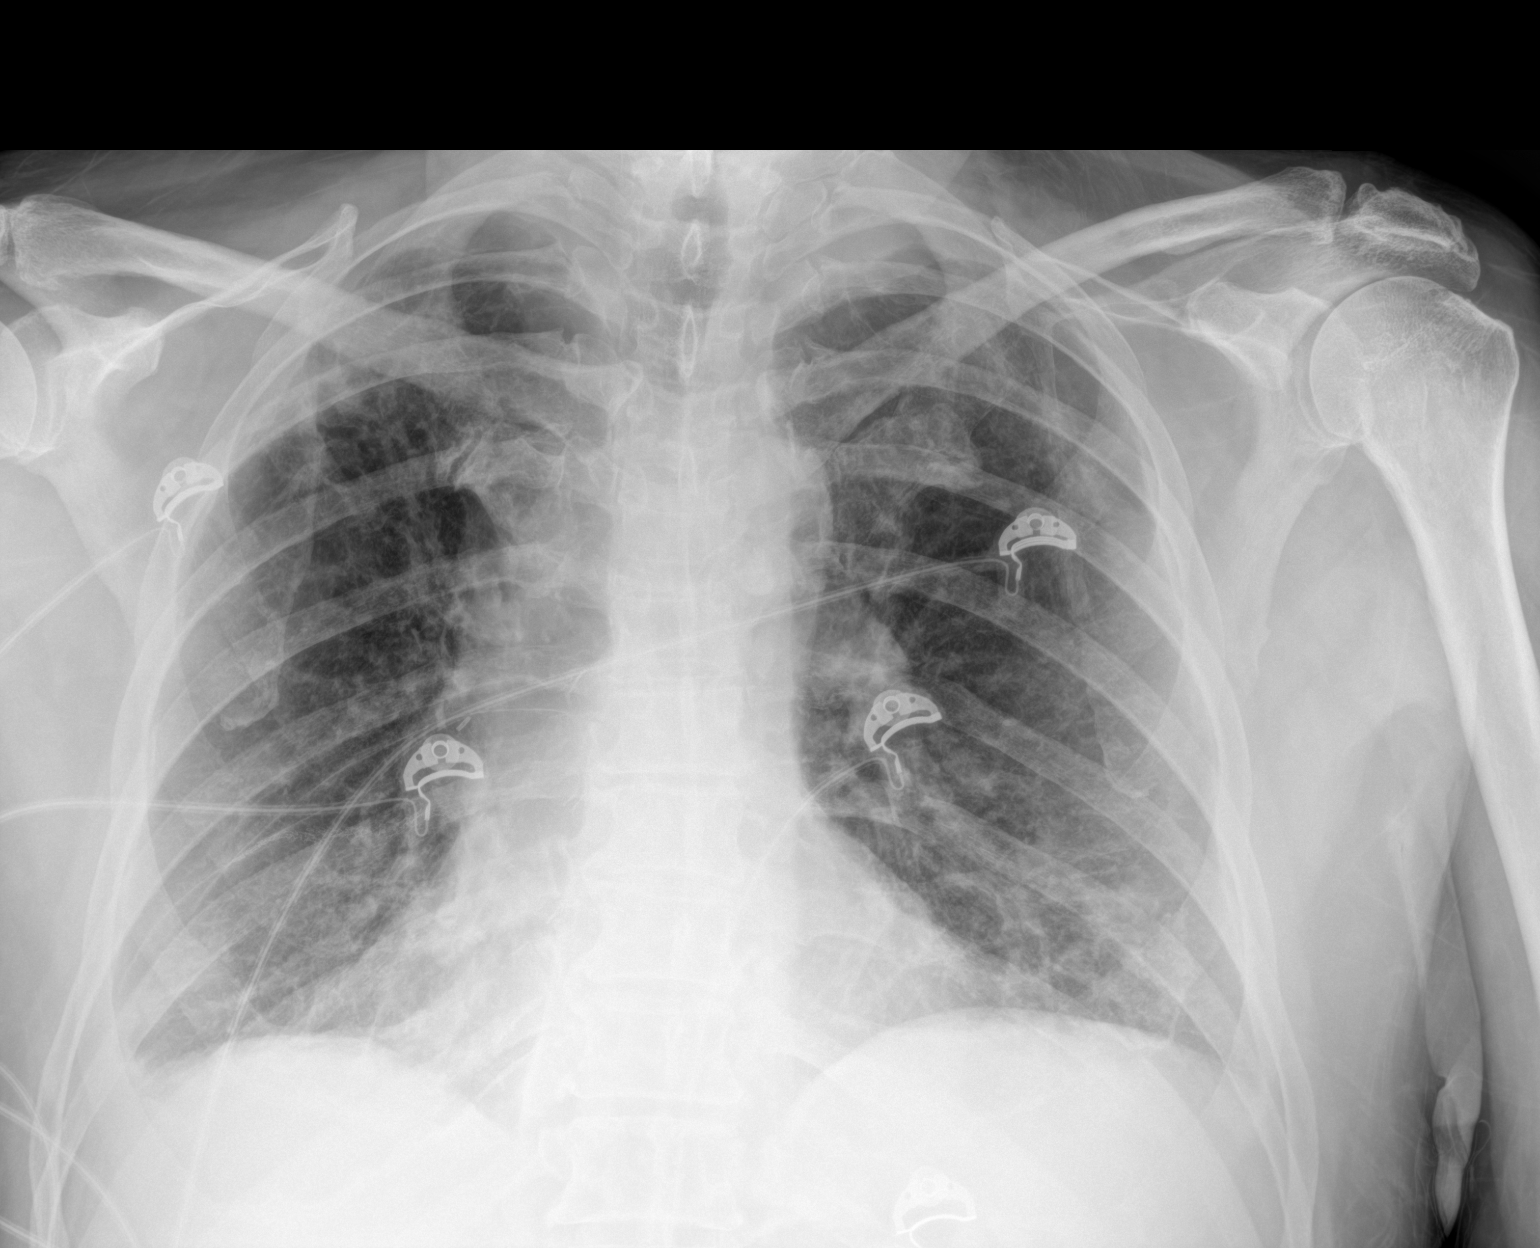

[1 of 1 positions shown; findings below may reference images not displayed]

FINDINGS: Heterogeneous bilateral opacities suggestive of scarring. No acute
airspace disease or effusion. Stable cardiomediastinal silhouette.
IMPRESSION: No active disease.  Stable bilateral pulmonary scarring

## 2022-05-19 IMAGING — CT CT HEAD W/O CM
4 series · 16 of 47 positions shown, 18 images · non-contrast
Comparison: CT examination dated [DATE]

CLINICAL DATA: Seizures.  Altered mental status.



[Series 2: head wo · axial · 0.46mm/px · z∈[-88,+32]mm · 7 of 32 slices shown, 9 images]
[im 4/32  brain]
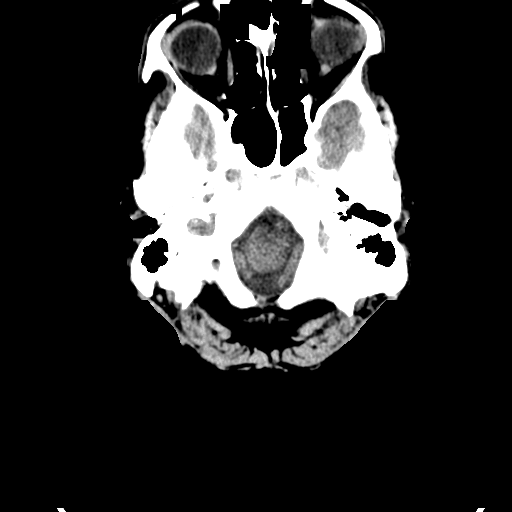
[im 4/32  bone]
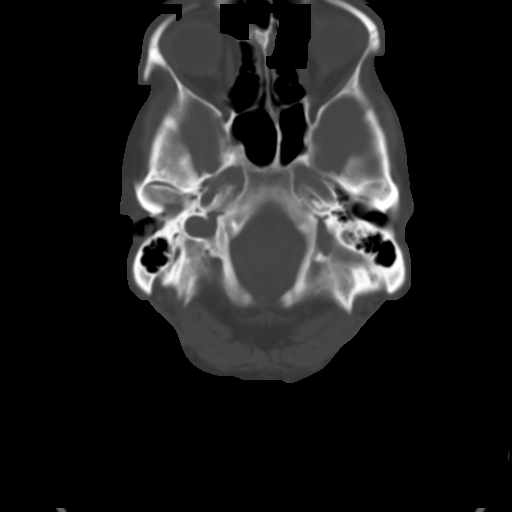
[im 8/32  brain]
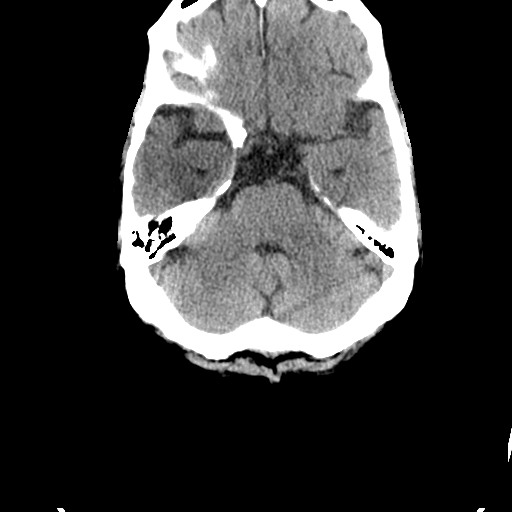
[im 12/32  brain]
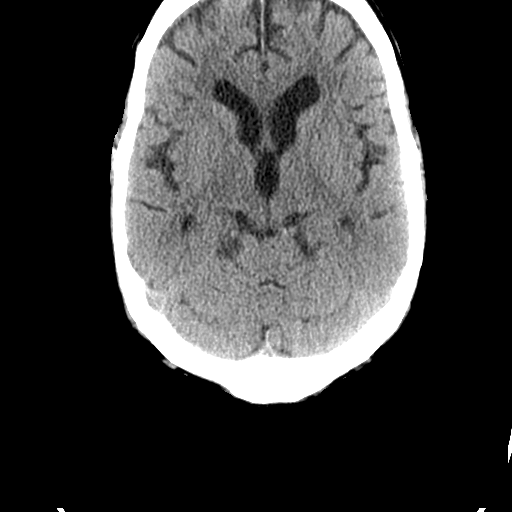
[im 16/32  brain]
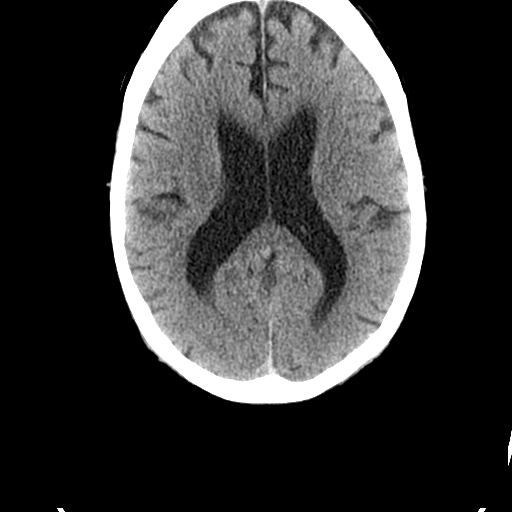
[im 20/32  brain]
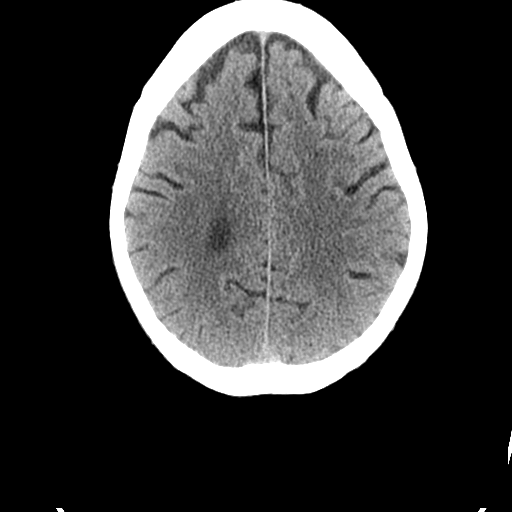
[im 20/32  bone]
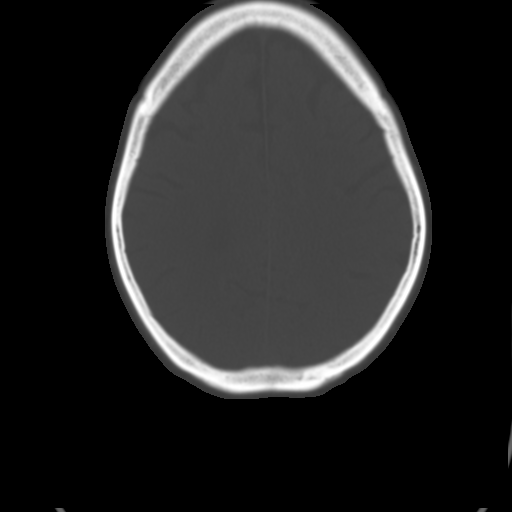
[im 24/32  brain]
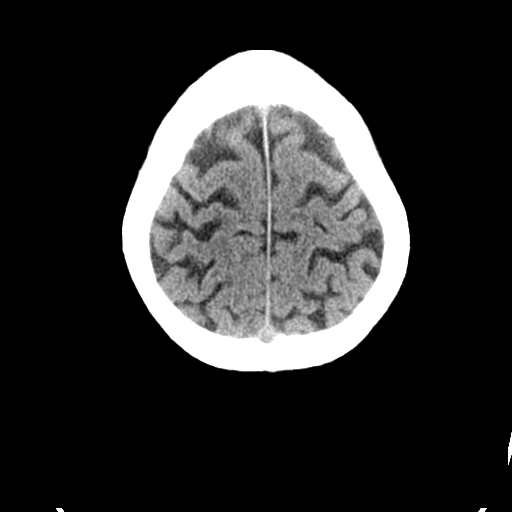
[im 28/32  brain]
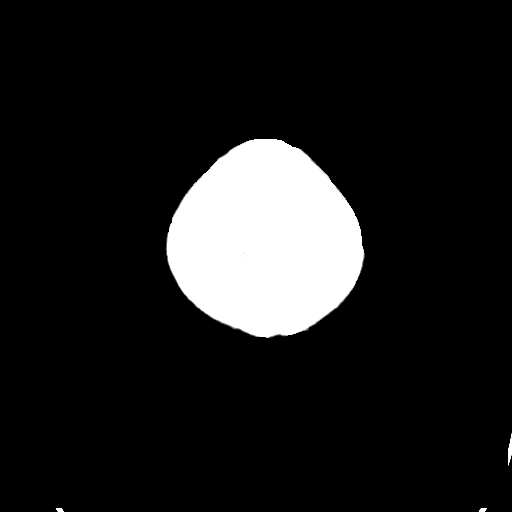

[Series 3: head bone · axial · 0.46mm/px · z∈[-89,-57]mm · 3 of 79 slices shown]
[im 8/79  bone]
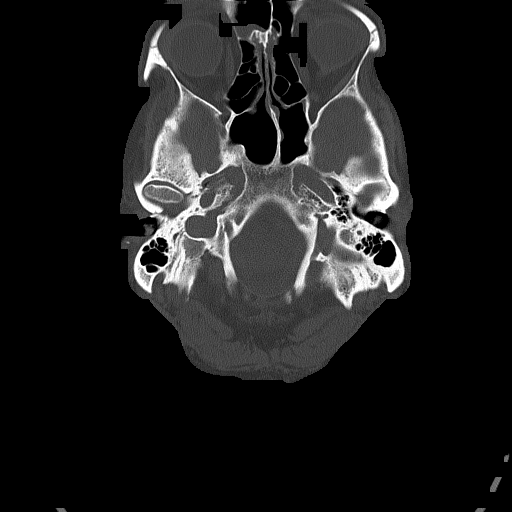
[im 16/79  bone]
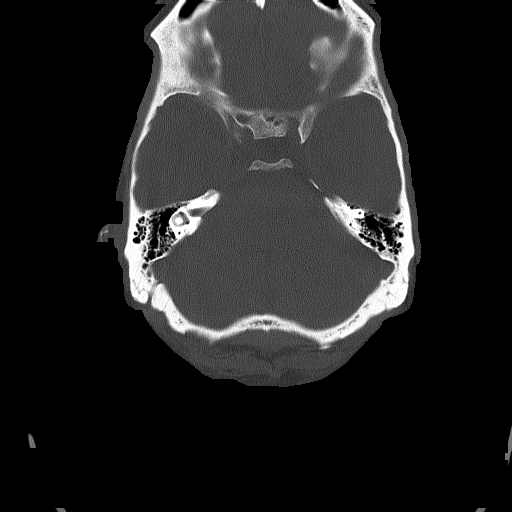
[im 24/79  bone]
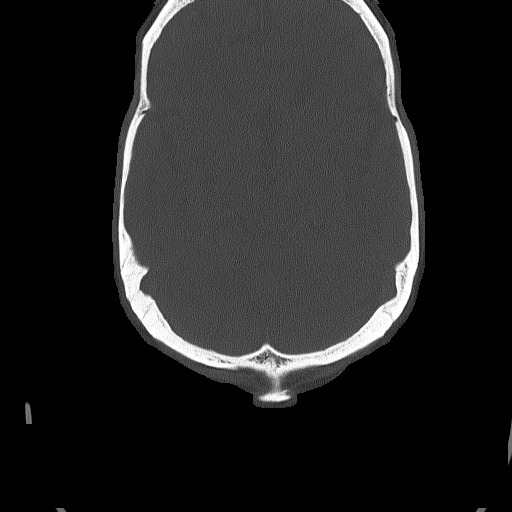

[Series 4: coronal soft tissue · coronal · 0.33mm/px · 3 of 72 slices shown]
[im 24/72  brain]
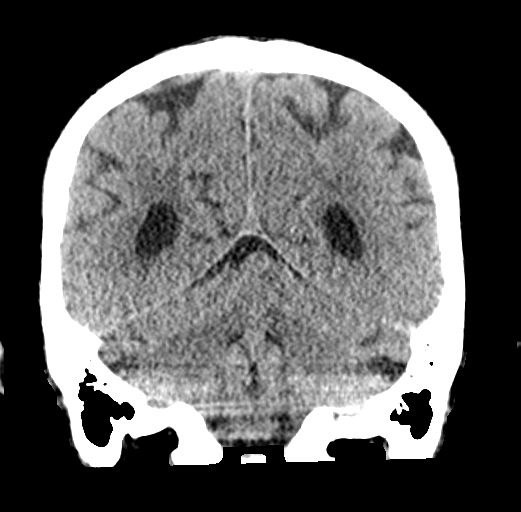
[im 32/72  brain]
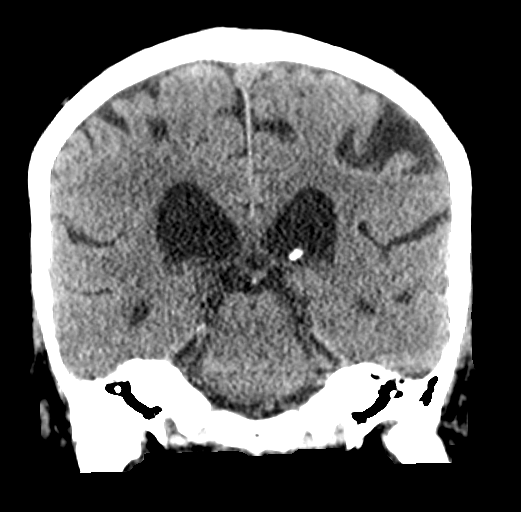
[im 40/72  brain]
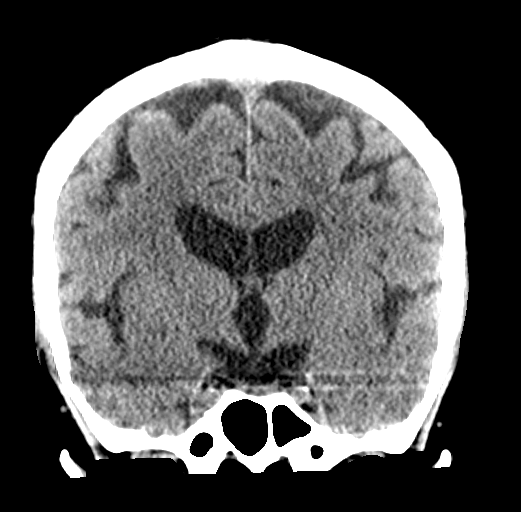

[Series 5: sagittal soft tissue · sagittal · 0.33mm/px · 3 of 53 slices shown]
[im 18/53  brain]
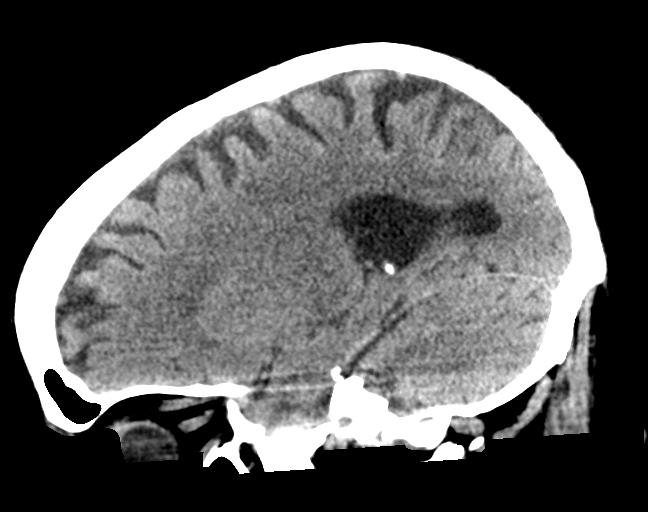
[im 27/53  brain]
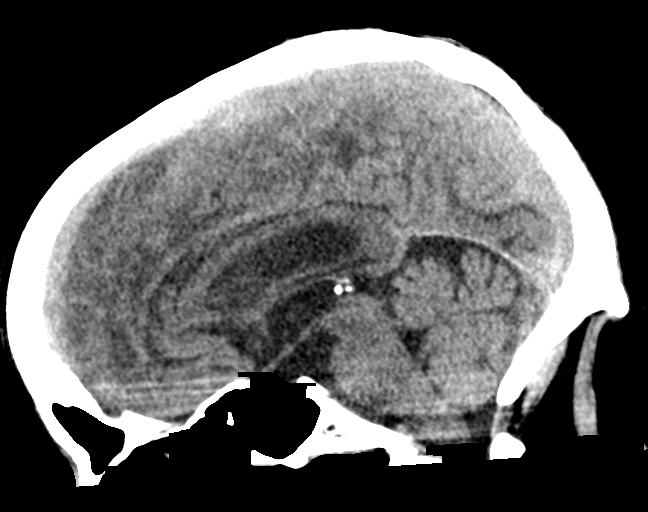
[im 35/53  brain]
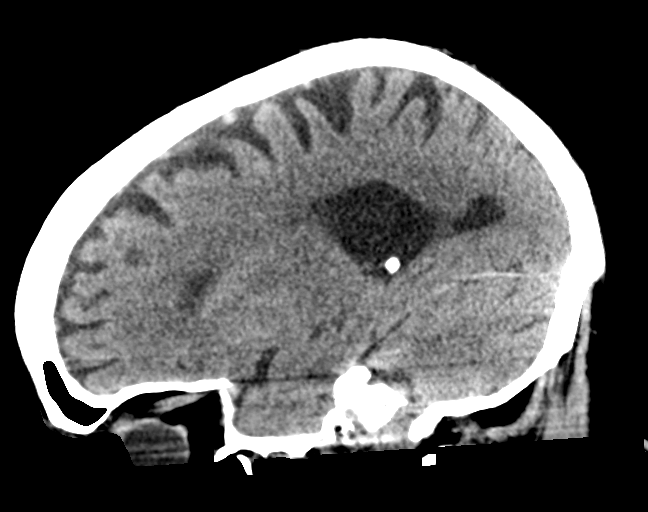

[16 of 47 positions shown; findings below may reference images not displayed]

FINDINGS: Brain: No evidence of acute infarction, hemorrhage, hydrocephalus,
extra-axial collection or mass lesion/mass effect. Mild cerebral
atrophy and chronic microvascular ischemic changes of the white
matter, unchanged.

Vascular: No hyperdense vessel or unexpected calcification.

Skull: Normal. Negative for fracture or focal lesion.

Sinuses/Orbits: No acute finding.

Other: None.
IMPRESSION: 1.  No acute intracranial abnormality.

2. Cerebral atrophy and chronic microvascular ischemic changes of
the white matter, unchanged.

## 2022-05-19 MED ORDER — LEVETIRACETAM IN NACL 1000 MG/100ML IV SOLN
1000.0000 mg | Freq: Once | INTRAVENOUS | Status: AC
  Administered 2022-05-19: 1000 mg via INTRAVENOUS
  Filled 2022-05-19: qty 100

## 2022-05-19 MED ORDER — SODIUM CHLORIDE 0.9 % IV SOLN: 2000.0000 mg | Freq: Once | INTRAVENOUS | Status: DC

## 2022-05-19 NOTE — ED Provider Notes (Incomplete)
[facility] Provider Note    Event Date/Time   First MD Initiated Contact with Patient 05/19/22 2106     (approximate)   History   Seizures   HPI  Samuel Moore is a 71 y.o. male presents with altered mental status.  Patient's wife said that he was in his normal state of health When this afternoon they were outside in the yard when he suddenly looked dazed and looked to be falling over like he was drunk.  She denies any shaking activity he did not fall.  He was not very responsive during this episode.  She dragged him to the car and during car ride to fire department he was going in and out apparently was closing his eyes and seem to be less responsive.  Did not have any gaze deviation again there was no shaking activity.  Patient's wife tells me he was diagnosed with seizures rather recently and is on an antiepileptic she is not sure which.  Looks like he is on Keppra.  Says that his seizures initially were generalized tonic-clonic.  Does not have history of focal seizures to her knowledge.  He has otherwise been well without fevers chills cough.  Has not missed any doses of his medication.  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      Excl. in [place]?     Most recent vital signs: Vitals:   05/19/22 2112  BP: (!) 143/74  Pulse: 78  Resp: 18  Temp: 98.1 F (36.7 C)  SpO2: 98%     General: Awake, appears fatigued CV:  Good peripheral perfusion.  1+ pitting edema bilaterally Resp:  Normal effort.  No increased work of breathing Abd:  No distention.  Abdomen is soft nontender.  Ileal conduit is pink draining yellow urine Neuro:             Patient is somewhat somnolent opens eyes to voice and does follow commands, he is sluggish to move but has equal strength in  his extremities pupils are equal round and reactive face is symmetric Other:     ED Results / Procedures / Treatments  Labs (all labs ordered are listed, but only abnormal results are displayed) Labs Reviewed  COMPREHENSIVE METABOLIC PANEL  CBC WITH DIFFERENTIAL/PLATELET  LACTIC ACID, PLASMA  LACTIC ACID, PLASMA  URINALYSIS, ROUTINE W REFLEX MICROSCOPIC  TROPONIN I (HIGH SENSITIVITY)     EKG  EKG interpretation performed by myself: NSR, nml axis, nml intervals, no acute ischemic changes    RADIOLOGY    PROCEDURES:  Critical Care performed: {CriticalCareYesNo:19197::"Yes, see critical care procedure note(s)","No"}  Procedures  The patient is on the cardiac monitor to evaluate for evidence of arrhythmia and/or significant heart rate changes.   MEDICATIONS ORDERED IN ED: Medications  levETIRAcetam (KEPPRA) 2,000 mg in sodium chloride 0.9 % 250 mL IVPB (has no administration in time range)     IMPRESSION / MDM / ASSESSMENT AND PLAN / ED  COURSE  I reviewed the triage vital signs and the nursing notes.                              Patient's presentation is most consistent with acute presentation with potential threat to life or bodily function.  Differential diagnosis includes, but is not limited to, metabolic encephalopathy, infection, seizures, CVA, intracranial hemorrhage  Patient is a 71 year old male with complex past medical history who presents with acute change in mental status.  Apparently he was in his normal state of health until he started staring off and looking unsteady to his wife.  He was able to ambulate to the car but with difficulty.  Has been in and out of consciousness since then.  Vital signs are reassuring.  On my evaluation patient appears tired having difficulty holding his eyes open but his neurologic exam is nonfocal he is alert and oriented and can follow commands just appears generally weak.  He has an ileal conduit for bladder abdomen is soft nontender does have some mild pitting edema.  He has no meningismus.  Differential is as above.  Patient does have history of seizure disorder focal seizure certainly possible will obtain a CT head and loaded with Keppra.  Patient's wife requesting transfer to [facility] or the [place].  Once we have some of the work-up back I will reach out to these locations to see if they have any openings.  Anticipate patient will need admission.       FINAL CLINICAL IMPRESSION(S) / ED DIAGNOSES   Final diagnoses:  None     Rx / DC Orders   ED Discharge Orders     None        Note:  This document was prepared using Dragon voice recognition software and may include unintentional dictation errors.

## 2022-05-19 NOTE — ED Triage Notes (Signed)
Pt arrived via [facility] from home where spouse reported pt recently diagnosed with seizures and today had aprox 3, last aprox 1 hour prior to arrival. Per spouse when seizures occur, pt stares off and typically last a minute or less. Pt recently had seizure medication increased but unable to remember med name. On arrival, pt alert to self and place but confused at situation and time. Hx/o bladder and blood cancer. Ostomy and urostomy bag in place with need for change. Pt c/o numbness in head with feeling of generalized weakness.   [facility]

## 2022-05-19 NOTE — ED Notes (Signed)
[facility] to get status on bed availability. Per [facility]vel[facility] Peat [facility] for [facility] beds

## 2022-05-19 NOTE — ED Notes (Signed)
Called [facility] to request transfer to [facility] spoke to [facility] @ 2328. Images were shared with [facility] @ 2333. Loma Sousa gathered information from [place] waiting on a call back

## 2022-05-20 ENCOUNTER — Other Ambulatory Visit: Payer: Self-pay

## 2022-05-20 DIAGNOSIS — R404 Transient alteration of awareness: Secondary | ICD-10-CM | POA: Diagnosis not present

## 2022-05-20 DIAGNOSIS — G40909 Epilepsy, unspecified, not intractable, without status epilepticus: Secondary | ICD-10-CM

## 2022-05-20 LAB — URINALYSIS, ROUTINE W REFLEX MICROSCOPIC
Bilirubin Urine: NEGATIVE
Glucose, UA: NEGATIVE mg/dL
Ketones, ur: NEGATIVE mg/dL
Nitrite: NEGATIVE
Protein, ur: NEGATIVE mg/dL
Specific Gravity, Urine: 1.009 (ref 1.005–1.030)
pH: 7 (ref 5.0–8.0)

## 2022-05-20 LAB — URINE DRUG SCREEN, QUALITATIVE (ARMC ONLY)
Amphetamines, Ur Screen: NOT DETECTED
Barbiturates, Ur Screen: NOT DETECTED
Benzodiazepine, Ur Scrn: NOT DETECTED
Cannabinoid 50 Ng, Ur ~~LOC~~: NOT DETECTED
Cocaine Metabolite,Ur ~~LOC~~: NOT DETECTED
MDMA (Ecstasy)Ur Screen: NOT DETECTED
Methadone Scn, Ur: NOT DETECTED
Opiate, Ur Screen: NOT DETECTED
Phencyclidine (PCP) Ur S: NOT DETECTED
Tricyclic, Ur Screen: NOT DETECTED

## 2022-05-20 LAB — BLOOD GAS, VENOUS
Acid-base deficit: 2.9 mmol/L — ABNORMAL HIGH (ref 0.0–2.0)
Bicarbonate: 22 mmol/L (ref 20.0–28.0)
O2 Saturation: 96.6 %
Patient temperature: 37
pCO2, Ven: 38 mmHg — ABNORMAL LOW (ref 44–60)
pH, Ven: 7.37 (ref 7.25–7.43)
pO2, Ven: 74 mmHg — ABNORMAL HIGH (ref 32–45)

## 2022-05-20 LAB — TYPE AND SCREEN
ABO/RH(D): O POS
Antibody Screen: NEGATIVE

## 2022-05-20 LAB — TROPONIN I (HIGH SENSITIVITY): Troponin I (High Sensitivity): 38 ng/L — ABNORMAL HIGH (ref <18)

## 2022-05-20 LAB — CBG MONITORING, ED: Glucose-Capillary: 115 mg/dL — ABNORMAL HIGH (ref 70–99)

## 2022-05-20 LAB — LACTIC ACID, PLASMA: Lactic Acid, Venous: 0.9 mmol/L (ref 0.5–1.9)

## 2022-05-20 MED ORDER — AZITHROMYCIN 500 MG PO TABS
250.0000 mg | ORAL_TABLET | ORAL | Status: DC
  Administered 2022-05-20: 250 mg via ORAL
  Filled 2022-05-20: qty 1

## 2022-05-20 MED ORDER — TRAZODONE HCL 50 MG PO TABS
50.0000 mg | ORAL_TABLET | Freq: Every evening | ORAL | Status: DC | PRN
Start: 2022-05-20 — End: 2022-05-20

## 2022-05-20 MED ORDER — ALBUTEROL SULFATE (2.5 MG/3ML) 0.083% IN NEBU: 2.5000 mg | INHALATION_SOLUTION | Freq: Four times a day (QID) | RESPIRATORY_TRACT | Status: DC | PRN

## 2022-05-20 MED ORDER — METOPROLOL SUCCINATE ER 25 MG PO TB24
12.5000 mg | ORAL_TABLET | Freq: Every day | ORAL | Status: DC
  Administered 2022-05-20: 12.5 mg via ORAL
  Filled 2022-05-20: qty 0.5

## 2022-05-20 MED ORDER — ACETAMINOPHEN 325 MG PO TABS: 650.0000 mg | ORAL_TABLET | Freq: Four times a day (QID) | ORAL | Status: DC | PRN

## 2022-05-20 MED ORDER — LEVETIRACETAM 500 MG PO TABS
1500.0000 mg | ORAL_TABLET | Freq: Two times a day (BID) | ORAL | Status: DC
  Administered 2022-05-20: 1500 mg via ORAL
  Filled 2022-05-20: qty 3

## 2022-05-20 MED ORDER — VITAMIN B-12 1000 MCG PO TABS
1000.0000 ug | ORAL_TABLET | Freq: Every day | ORAL | Status: DC
Start: 2022-05-20 — End: 2022-05-20
  Administered 2022-05-20: 1000 ug via ORAL
  Filled 2022-05-20: qty 1

## 2022-05-20 MED ORDER — PANTOPRAZOLE SODIUM 40 MG PO TBEC
40.0000 mg | DELAYED_RELEASE_TABLET | Freq: Every day | ORAL | Status: DC
  Administered 2022-05-20: 40 mg via ORAL
  Filled 2022-05-20: qty 1

## 2022-05-20 MED ORDER — BUDESONIDE 0.5 MG/2ML IN SUSP
2.0000 mL | Freq: Two times a day (BID) | RESPIRATORY_TRACT | Status: DC
Start: 2022-05-20 — End: 2022-05-20

## 2022-05-20 MED ORDER — IPRATROPIUM BROMIDE 0.02 % IN SOLN
0.5000 mg | Freq: Four times a day (QID) | RESPIRATORY_TRACT | Status: DC
Start: 2022-05-20 — End: 2022-05-20

## 2022-05-20 MED ORDER — ATORVASTATIN CALCIUM 20 MG PO TABS
80.0000 mg | ORAL_TABLET | Freq: Every day | ORAL | Status: DC
  Administered 2022-05-20: 80 mg via ORAL
  Filled 2022-05-20: qty 4

## 2022-05-20 MED ORDER — PREGABALIN 50 MG PO CAPS
75.0000 mg | ORAL_CAPSULE | Freq: Two times a day (BID) | ORAL | Status: DC
  Administered 2022-05-20: 75 mg via ORAL
  Filled 2022-05-20: qty 1

## 2022-05-20 MED ORDER — VITAMIN D 25 MCG (1000 UNIT) PO TABS
1000.0000 [IU] | ORAL_TABLET | Freq: Every day | ORAL | Status: DC
  Administered 2022-05-20: 1000 [IU] via ORAL
  Filled 2022-05-20: qty 1

## 2022-05-20 MED ORDER — ASPIRIN 81 MG PO CHEW
81.0000 mg | CHEWABLE_TABLET | Freq: Every day | ORAL | Status: DC
  Administered 2022-05-20: 81 mg via ORAL
  Filled 2022-05-20: qty 1

## 2022-05-20 MED ORDER — POLYVINYL ALCOHOL 1.4 % OP SOLN: 1.0000 [drp] | Freq: Four times a day (QID) | OPHTHALMIC | Status: DC | PRN

## 2022-05-20 NOTE — Assessment & Plan Note (Signed)
Loaded with Keppra in the ED Continue Keppra 1.5 g twice daily Ativan as needed seizures Neurology consult

## 2022-05-20 NOTE — Assessment & Plan Note (Signed)
Platelets 24,000, down from baseline of 39,000 about 7 months prior Continue to trend No evidence of bleeding Consider Hematology consult in the am

## 2022-05-20 NOTE — Assessment & Plan Note (Signed)
WBC and hemoglobin at baseline but platelets downtrending SCDs for DVT prophylaxis Continue to monitor cell lines Consult hematology if worsening

## 2022-05-20 NOTE — H&P (Signed)
History and Physical    Patient: Samuel Moore EVO:[id number] DOB: 11-29-1951 DOA: 05/19/2022 DOS: the patient was seen and examined on 05/20/2022 PCP: [facility] [place]  Patient coming from: Home  Chief Complaint:  Chief Complaint  Patient presents with   Seizures    HPI: Samuel Moore is a 71 y.o. 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  On the way to the hospital patient had fluctuating levels of consciousness.  No jerking or shaking was noted.He was more consistently awake on arrival. Says he never misses a dose of his Keppra. ED course and data review: On arrival vitals within normal limits: Blood work significant for pancytopenia with WBC 2.6, hemoglobin 9.3 and platelets 24,000 down from baseline of 39,000 (WBC and hemoglobin at baseline) lactic acid 1, troponin 37.  EKG, personally viewed and interpreted with sinus rhythm at 85 with nonspecific ST-T wave changes.  Chest x-ray clear.  CT head with no acute intracranial abnormality.  Cerebral atrophy and chronic microvascular ischemic changes of white matter, unchanged. Patient loaded with Keppra.  Hospitalist consulted for overnight observation.  Transfer was attempted to [facility] where patient usually receives care but they were on diversion.     Past Medical History:  Diagnosis Date   Cancer ([place])    COPD (chronic obstructive pulmonary disease) ([place])    Hypertension    Past Surgical History:  Procedure  Laterality Date   BLADDER REMOVAL     LOBECTOMY     prostate     removed   Social History:  reports that he quit smoking about 19 years ago. His smoking use included cigarettes. He has a 20.00 pack-year smoking history. He has never used smokeless tobacco. He reports that he does not drink alcohol and does not use drugs.  Allergies  Allergen Reactions   Lisinopril-Hydrochlorothiazide Other (See Comments)    Renal failure syndrome    History reviewed. No pertinent family history.  Prior to Admission medications   Medication Sig Start Date End Date Taking? Authorizing Provider  albuterol (PROVENTIL) (2.5 MG/3ML) 0.083% nebulizer solution Take 2.5 mg by nebulization every 6 (six) hours as needed for wheezing or shortness of breath.   Yes [provider]  albuterol (VENTOLIN HFA) 108 (90 Base) MCG/ACT inhaler Inhale 2 puffs into the lungs 4 (four) times daily as needed for wheezing or shortness of breath.  04/17/20  Yes [provider]  aspirin 81 MG chewable tablet Chew 81 mg by mouth in the morning.   Yes [provider]  atorvastatin (LIPITOR) 80 MG tablet Take 80 mg by mouth daily. 05/30/19  Yes [provider]  azithromycin (ZITHROMAX) 250 MG tablet Take 250 mg by mouth 3 (three) times a week. Monday, Wednesday, Friday   Yes [provider]  Carboxymethylcellulose Sod PF 0.5 % SOLN Place 1 drop into both eyes 4 (four) times daily.   Yes [provider]  Cholecalciferol 25 MCG (1000 UT) tablet Take 1,000 Units by mouth daily. 04/30/21  Yes  [provider]  ciclesonide (ALVESCO) 160 MCG/ACT inhaler Inhale 1 puff into the lungs 2 (two) times daily.   Yes [provider]  cyanocobalamin 1000 MCG tablet Take 1,000 mcg by mouth daily.   Yes [provider]  ipratropium (ATROVENT) 0.02 % nebulizer solution Take 0.5 mg by nebulization 4 (four) times daily.   Yes [provider]  levETIRAcetam (KEPPRA) 750 MG  tablet Take 1,500 mg by mouth 2 (two) times daily.   Yes [provider]  metoprolol succinate (TOPROL-XL) 25 MG 24 hr tablet Take 12.5 mg by mouth daily.   Yes [provider]  pantoprazole (PROTONIX) 40 MG tablet Take 40 mg by mouth daily before breakfast.   Yes [provider]  Tiotropium Bromide Monohydrate (SPIRIVA RESPIMAT) 2.5 MCG/ACT AERS Inhale 1 puff into the lungs in the morning and at bedtime.   Yes [provider]  traZODone (DESYREL) 50 MG tablet Take 50 mg by mouth at bedtime as needed for sleep.   Yes [provider]  Grant Ruts INHUB 250-50 MCG/DOSE AEPB Inhale 1 puff into the lungs 2 (two) times daily.   Yes [provider]    Physical Exam: Vitals:   05/19/22 2112 05/19/22 2130 05/19/22 2230 05/19/22 2300  BP: (!) 143/74 (!) 146/74 136/70 118/63  Pulse: 78 80 69 71  Resp: '18 15 16 16  '$ Temp: 98.1 F (36.7 C)     TempSrc: Oral     SpO2: 98% 100% 100% 99%   Physical Exam Vitals and nursing note reviewed.  Constitutional:      General: He is not in acute distress. HENT:     Head: Normocephalic and atraumatic.  Cardiovascular:     Rate and Rhythm: Normal rate and regular rhythm.     Heart sounds: Normal heart sounds.  Pulmonary:     Effort: Pulmonary effort is normal.     Breath sounds: Normal breath sounds.  Abdominal:     Palpations: Abdomen is soft.     Tenderness: There is no abdominal tenderness.  Neurological:     Mental Status: Mental status is at baseline.    Labs on Admission: I have personally reviewed following labs and imaging studies  CBC: Recent Labs  Lab 05/19/22 2242  WBC 2.6*  NEUTROABS 1.6*  HGB 9.3*  HCT 31.0*  MCV 83.6  PLT 24*   Basic Metabolic Panel: Recent Labs  Lab 05/19/22 2242  NA 136  K 3.8  CL 110  CO2 22  GLUCOSE 106*  BUN 23  CREATININE 1.15  CALCIUM 8.4*   GFR: CrCl cannot be calculated (Unknown ideal weight.). Liver Function Tests: Recent Labs  Lab  05/19/22 2242  AST 15  ALT 10  ALKPHOS 67  BILITOT 0.5  PROT 6.6  ALBUMIN 3.5   No results for input(s): LIPASE, AMYLASE in the last 168 hours. No results for input(s): AMMONIA in the last 168 hours. Coagulation Profile: No results for input(s): INR, PROTIME in the last 168 hours. Cardiac Enzymes: No results for input(s): CKTOTAL, CKMB, CKMBINDEX, TROPONINI in the last 168 hours. BNP (last 3 results) No results for input(s): PROBNP in the last 8760 hours. HbA1C: No results for input(s): HGBA1C in the last 72 hours. CBG: No results for input(s): GLUCAP in the last 168 hours. Lipid Profile: No results for input(s): CHOL, HDL, LDLCALC, TRIG, CHOLHDL, LDLDIRECT in the last 72 hours. Thyroid Function Tests: No results for input(s): TSH, T4TOTAL, FREET4, T3FREE, THYROIDAB in the last 72 hours. Anemia  Panel: No results for input(s): VITAMINB12, FOLATE, FERRITIN, TIBC, IRON, RETICCTPCT in the last 72 hours. Urine analysis:    Component Value Date/Time   COLORURINE YELLOW (A) 06/06/2021 2224   APPEARANCEUR CLOUDY (A) 06/06/2021 2224   LABSPEC 1.011 06/06/2021 2224   PHURINE 9.0 (H) 06/06/2021 2224   GLUCOSEU NEGATIVE 06/06/2021 2224   HGBUR NEGATIVE 06/06/2021 2224   BILIRUBINUR NEGATIVE 06/06/2021 2224   KETONESUR NEGATIVE 06/06/2021 2224   PROTEINUR 30 (A) 06/06/2021 2224   NITRITE POSITIVE (A) 06/06/2021 2224   LEUKOCYTESUR NEGATIVE 06/06/2021 2224    Radiological Exams on Admission: CT HEAD WO CONTRAST (5MM)  Result Date: 05/19/2022 CLINICAL DATA:  Seizures.  Altered mental status. EXAM: CT HEAD WITHOUT CONTRAST TECHNIQUE: Contiguous axial images were obtained from the base of the skull through the vertex without intravenous contrast. RADIATION DOSE REDUCTION: This exam was performed according to the departmental dose-optimization program which includes automated exposure control, adjustment of the mA and/or kV according to patient size and/or use of iterative reconstruction  technique. COMPARISON:  CT examination dated June 06, 2021 FINDINGS: Brain: No evidence of acute infarction, hemorrhage, hydrocephalus, extra-axial collection or mass lesion/mass effect. Mild cerebral atrophy and chronic microvascular ischemic changes of the white matter, unchanged. Vascular: No hyperdense vessel or unexpected calcification. Skull: Normal. Negative for fracture or focal lesion. Sinuses/Orbits: No acute finding. Other: None. IMPRESSION: 1.  No acute intracranial abnormality. 2. Cerebral atrophy and chronic microvascular ischemic changes of the white matter, unchanged. Electronically Signed   By: Keane Police D.O.   On: 05/19/2022 22:19   DG Chest Portable 1 View  Result Date: 05/19/2022 CLINICAL DATA:  Shortness of breath EXAM: PORTABLE CHEST 1 VIEW COMPARISON:  06/06/2021, CT 09/26/2019 FINDINGS: Heterogeneous bilateral opacities suggestive of scarring. No acute airspace disease or effusion. Stable cardiomediastinal silhouette. IMPRESSION: No active disease.  Stable bilateral pulmonary scarring Electronically Signed   By: Donavan Foil M.D.   On: 05/19/2022 21:45     Data Reviewed: Relevant notes from primary care and specialist visits, past discharge summaries as available in EHR, including Care Everywhere. Prior diagnostic testing as pertinent to current admission diagnoses Updated medications and problem lists for reconciliation ED course, including vitals, labs, imaging, treatment and response to treatment Triage notes, nursing and pharmacy notes and ED provider's notes Notable results as noted in HPI   Assessment and Plan: * Transient alteration of awareness Etiology uncertain.  Suspect possible nonconvulsive seizure activity.  Acute stroke not suspected given no focal deficits Possible acute metabolic encephalopathy but no stigmata of infection or other pathology Will get UDS, Keppra level Patient loaded with Keppra in the ED Continuous cardiac monitoring Patient had a  normal bubble study and EKG at [facility] in March 2023 (note reviewed).  Will not repeat Neurologic checks with fall seizure and aspiration precautions   Seizure disorder ([place]) Loaded with Keppra in the ED Continue Keppra 1.5 g twice daily Ativan as needed seizures Neurology consult   Thrombocytopenia ([place]) Platelets 24,000, down from baseline of 39,000 about 7 months prior Continue to trend No evidence of bleeding Consider Hematology consult in the am  Myelodysplastic syndrome (HCC) WBC and hemoglobin at baseline but platelets downtrending SCDs for DVT prophylaxis Continue to monitor cell lines Consult hematology if worsening  COPD (chronic obstructive pulmonary disease) ([place]) Not acutely exacerbated.  Continue DuoNebs and home inhalers.  Hypertension BP stable.  Continue Toprol  Bladder cancer s/p cystectomy/ileal conduit (HCC) No acute issues        DVT  prophylaxis: SCD  Consults: Neurology, Dr. Quinn Axe  Advance Care Planning:   Code Status: Prior   Family Communication: none  Disposition Plan: Back to previous home environment  Severity of Illness: The appropriate patient status for this patient is OBSERVATION. Observation status is judged to be reasonable and necessary in order to provide the required intensity of service to ensure the patient's safety. The patient's presenting symptoms, physical exam findings, and initial radiographic and laboratory data in the context of their medical condition is felt to place them at decreased risk for further clinical deterioration. Furthermore, it is anticipated that the patient will be medically stable for discharge from the hospital within 2 midnights of admission.   Author: Athena Masse, MD 05/20/2022 12:13 AM  For on call review [email or web address].

## 2022-05-20 NOTE — Progress Notes (Signed)
Admission profile updated. ?

## 2022-05-20 NOTE — Assessment & Plan Note (Signed)
BP stable.  Continue Toprol

## 2022-05-20 NOTE — Assessment & Plan Note (Signed)
No acute issues.

## 2022-05-20 NOTE — Assessment & Plan Note (Addendum)
Etiology uncertain.  Suspect possible nonconvulsive seizure activity.  Acute stroke not suspected given no focal deficits Possible acute metabolic encephalopathy but no stigmata of infection or other pathology Will get UDS, Keppra level Patient loaded with Keppra in the ED Continuous cardiac monitoring Patient had a normal bubble study and EKG at [facility] in March 2023 (note reviewed).  Will not repeat Neurologic checks with fall seizure and aspiration precautions

## 2022-05-20 NOTE — Plan of Care (Signed)
D/w hospitalist Dr. Thereasa Solo. Patient admitted after breakthrough partial seizure similar to his episode in March. He had extensive workup <3 mos ago and no further inpatient neurologic workup recommended at this time. He reports compliance with keppra '1500mg'$  bid and no precipitating factors for this seizure identified. Therefore it is appropriate to add a second agent. Many antiseizure meds cause cytopenias, therefore in the setting of MDS pregabalin would likely be a good choice for him. If he is back to baseline OK to discharge on keppra '1500mg'$  bid + pregabalin '75mg'$  bid. No driving x6 mos. F/u at [facility]. Please contact neurology for any further questions.   Su Monks, MD [facility] [phone]  If 7pm- 7am, please page neurology on call as listed in [place].

## 2022-05-20 NOTE — ED Notes (Signed)
Dr. Thereasa Solo notified that patient's wife would like to speak with him re: immediate plan of care.  Stated it will be several hours before he will be down to round on patient, but will call her if she is not in the room at that time.  Wife updated.

## 2022-05-20 NOTE — Progress Notes (Signed)
Patient discharged to home with family. Discharge instructions reviewed.  All questions answered.  PIV x 1 removed, no bleeding, intact. Patient will follow up with his PCP.

## 2022-05-20 NOTE — Assessment & Plan Note (Signed)
Not acutely exacerbated.  Continue DuoNebs and home inhalers.

## 2022-05-20 NOTE — Discharge Summary (Signed)
DISCHARGE SUMMARY  Samuel Moore  MR#: [id number]  DOB:07-24-1951  Date of Admission: 05/19/2022 Date of Discharge: 05/20/2022  Attending Physician:Amber Guthridge Hennie Duos, MD  Patient's KZS:WFUXNA, [facility]  Consults: Neurology   Disposition: D/C home   Follow-up Appts:  [facility].   Specialty: General Practice Contact information: [address] [place] [address] [phone]                 Discharge Diagnoses: Seizure disorder with suspected breakthrough seizure Myelodysplastic syndrome History of TIA Stage II lung cancer status post LLL resection HTN HLD COPD  Initial presentation: 71 year old [employer] veteran of the [place] [employer] with a history of aborted stroke status post tPA, TIA, seizure disorder, AAA, stage II lung cancer status post left lower lobe resection, bladder cancer status post cystectomy and urostomy, COPD, HTN, HLD, OSA, and myelodysplastic syndrome who was seen in the [facility] ED 02/25/2022 with left-sided weakness and ruled out for acute CVA with a diagnosis made of acute seizure.  At that time he was discharged on Keppra 1.5 mg twice daily. He was brought to the [facility] ER after he suffered an episode of "staring" during which he was noncommunicative.  On the way to the ER the patient's family reported fluctuating levels of consciousness.  There was no convulsive activity.  Upon arrival he was more consistently awake.  He reported strict compliance with his Keppra.  Work-up in the ER was unrevealing including a CT head which revealed no acute abnormality.  The patient was loaded with IV Keppra.  Telephone consultation with neurology was carried out.  Hospital Course: The patient was monitored in the emergency room.  No recurrent episodes were encountered.  His vital signs remained stable.  Work-up as noted above was unrevealing.  His care was discussed with the on-call neurologist who, upon reviewing his  records, suggested the addition of Lyrica to his treatment regimen as this was felt to have represented a breakthrough seizure.  This was discussed with the patient and his wife but they did not wish to use Lyrica long-term.  At the request of the patient and his wife the patient's care was discussed with his epileptologist at the [facility] over the telephone.  This physician understood the plan, agreed w/ the patient that Lyrica was not absolutely necessary, and agreed to follow the patient up in her clinic in short course.  The patient was advised to return to the ER immediately should he develop recurrent episodes.  He was provided with the usual warnings for anyone having undergone a seizure.  His wife reported that she would ensure he get very close follow-up at the [facility].  At the time of his discharge he was alert oriented and had no specific complaints.  Vital signs were stable and he was afebrile.  Allergies as of 05/20/2022       Reactions   Lisinopril-hydrochlorothiazide Other (See Comments)   Renal failure syndrome        Medication List     TAKE these medications    albuterol (2.5 MG/3ML) 0.083% nebulizer solution Commonly known as: PROVENTIL Take 2.5 mg by nebulization every 6 (six) hours as needed for wheezing or shortness of breath.   albuterol 108 (90 Base) MCG/ACT inhaler Commonly known as: VENTOLIN HFA Inhale 2 puffs into the lungs 4 (four) times daily as needed for wheezing or shortness of breath.   aspirin 81 MG chewable tablet Chew  81 mg by mouth in the morning.   atorvastatin 80 MG tablet Commonly known as: LIPITOR Take 80 mg by mouth daily.   azithromycin 250 MG tablet Commonly known as: ZITHROMAX Take 250 mg by mouth 3 (three) times a week. Monday, Wednesday, Friday   Carboxymethylcellulose Sod PF 0.5 % Soln Place 1 drop into both eyes 4 (four) times daily.   Cholecalciferol 25 MCG (1000 UT) tablet Take 1,000 Units by mouth daily.   ciclesonide 160  MCG/ACT inhaler Commonly known as: ALVESCO Inhale 1 puff into the lungs 2 (two) times daily.   cyanocobalamin 1000 MCG tablet Take 1,000 mcg by mouth daily.   ipratropium 0.02 % nebulizer solution Commonly known as: ATROVENT Take 0.5 mg by nebulization 4 (four) times daily.   levETIRAcetam 750 MG tablet Commonly known as: KEPPRA Take 1,500 mg by mouth 2 (two) times daily.   metoprolol succinate 25 MG 24 hr tablet Commonly known as: TOPROL-XL Take 12.5 mg by mouth daily.   pantoprazole 40 MG tablet Commonly known as: PROTONIX Take 40 mg by mouth daily before breakfast.   Spiriva Respimat 2.5 MCG/ACT Aers Generic drug: Tiotropium Bromide Monohydrate Inhale 1 puff into the lungs in the morning and at bedtime.   traZODone 50 MG tablet Commonly known as: DESYREL Take 50 mg by mouth at bedtime as needed for sleep.   Wixela Inhub 250-50 MCG/ACT Aepb Generic drug: fluticasone-salmeterol Inhale 1 puff into the lungs 2 (two) times daily.        Day of Discharge BP 129/67 (BP Location: Right Arm)   Pulse 70   Temp (!) 97.5 F (36.4 C) (Oral)   Resp 18   SpO2 99%   Physical Exam: General: No acute respiratory distress Lungs: Clear to auscultation bilaterally without wheezes or crackles Cardiovascular: Regular rate and rhythm without murmur gallop or rub normal S1 and S2 Abdomen: Nontender, nondistended, soft, bowel sounds positive, no rebound, no ascites, no appreciable mass Extremities: No significant cyanosis, clubbing, or edema bilateral lower extremities  Basic Metabolic Panel: Recent Labs  Lab 05/19/22 2242  NA 136  K 3.8  CL 110  CO2 22  GLUCOSE 106*  BUN 23  CREATININE 1.15  CALCIUM 8.4*    Liver Function Tests: Recent Labs  Lab 05/19/22 2242  AST 15  ALT 10  ALKPHOS 67  BILITOT 0.5  PROT 6.6  ALBUMIN 3.5   CBC: Recent Labs  Lab 05/19/22 2242  WBC 2.6*  NEUTROABS 1.6*  HGB 9.3*  HCT 31.0*  MCV 83.6  PLT 24*   CBG: Recent Labs  Lab  05/20/22 0414  GLUCAP 115*    Time spent in discharge (includes decision making & examination of pt): 35 minutes  05/20/2022, 4:48 PM   Cherene Altes, MD [facility] Office  [phone]

## 2022-05-20 NOTE — ED Notes (Signed)
Seizure pads in place.  Patient sleeping

## 2022-05-21 LAB — LEVETIRACETAM LEVEL: Levetiracetam Lvl: 84.9 ug/mL — ABNORMAL HIGH (ref 10.0–40.0)

## 2022-05-22 ENCOUNTER — Telehealth: Payer: Self-pay | Admitting: Emergency Medicine

## 2022-05-22 NOTE — Telephone Encounter (Signed)
Called patient due to levitricam level is resulted so [facility] can [facility] with [facility].  Left message.  Called wife's number as well and left message to call me.

## 2022-07-15 ENCOUNTER — Emergency Department
Admission: EM | Admit: 2022-07-15 | Discharge: 2022-07-15 | Disposition: A | Discharge status: Home / Self Care | Payer: No Typology Code available for payment source

## 2022-07-15 NOTE — ED Triage Notes (Signed)
First Nurse Note:   Pt was here for tremors. Family at STAT desk upset because they did not get a room in the back, stating that she would take him to the [place]. This RN visualized leaving with patient.

## 2022-10-14 DEATH — deceased
